# Patient Record
Sex: Female | Born: 1980
Health system: Southern US, Community
[De-identification: ages and names within clinical notes are randomized; demographics above are authoritative.]

## PROBLEM LIST (undated history)

## (undated) DIAGNOSIS — R7303 Prediabetes: Secondary | ICD-10-CM

## (undated) DIAGNOSIS — M1990 Unspecified osteoarthritis, unspecified site: Secondary | ICD-10-CM

## (undated) DIAGNOSIS — T7840XA Allergy, unspecified, initial encounter: Secondary | ICD-10-CM

## (undated) DIAGNOSIS — Z95810 Presence of automatic (implantable) cardiac defibrillator: Secondary | ICD-10-CM

## (undated) DIAGNOSIS — D573 Sickle-cell trait: Secondary | ICD-10-CM

## (undated) DIAGNOSIS — D8685 Sarcoid myocarditis: Secondary | ICD-10-CM

## (undated) HISTORY — DX: Allergy, unspecified, initial encounter: T78.40XA

## (undated) HISTORY — DX: Sarcoid myocarditis: D86.85

## (undated) HISTORY — DX: Sickle-cell trait: D57.3

## (undated) HISTORY — DX: Atrioventricular block, complete: I44.2

## (undated) HISTORY — DX: Unspecified osteoarthritis, unspecified site: M19.90

---

## 1998-04-29 ENCOUNTER — Other Ambulatory Visit: Admission: RE | Admit: 1998-04-29 | Discharge: 1998-04-29 | Payer: Self-pay | Admitting: Family Medicine

## 1998-10-16 ENCOUNTER — Emergency Department (HOSPITAL_COMMUNITY): Admission: EM | Admit: 1998-10-16 | Discharge: 1998-10-16 | Payer: Self-pay | Admitting: Emergency Medicine

## 1998-10-16 ENCOUNTER — Encounter: Payer: Self-pay | Admitting: Emergency Medicine

## 2001-04-29 ENCOUNTER — Other Ambulatory Visit: Admission: RE | Admit: 2001-04-29 | Discharge: 2001-04-29 | Payer: Self-pay | Admitting: Family Medicine

## 2001-10-14 ENCOUNTER — Other Ambulatory Visit: Admission: RE | Admit: 2001-10-14 | Discharge: 2001-10-14 | Payer: Self-pay | Admitting: Internal Medicine

## 2001-12-12 ENCOUNTER — Encounter: Payer: Self-pay | Admitting: *Deleted

## 2001-12-12 ENCOUNTER — Emergency Department (HOSPITAL_COMMUNITY): Admission: EM | Admit: 2001-12-12 | Discharge: 2001-12-12 | Payer: Self-pay | Admitting: *Deleted

## 2003-05-18 ENCOUNTER — Other Ambulatory Visit: Admission: RE | Admit: 2003-05-18 | Discharge: 2003-05-18 | Payer: Self-pay | Admitting: Family Medicine

## 2004-09-26 ENCOUNTER — Ambulatory Visit: Payer: Self-pay | Admitting: Family Medicine

## 2004-10-16 ENCOUNTER — Other Ambulatory Visit: Admission: RE | Admit: 2004-10-16 | Discharge: 2004-10-16 | Payer: Self-pay | Admitting: Family Medicine

## 2004-10-16 ENCOUNTER — Ambulatory Visit: Payer: Self-pay | Admitting: Family Medicine

## 2004-11-21 ENCOUNTER — Ambulatory Visit: Payer: Self-pay | Admitting: Family Medicine

## 2005-01-17 ENCOUNTER — Ambulatory Visit: Payer: Self-pay | Admitting: Family Medicine

## 2005-02-20 ENCOUNTER — Ambulatory Visit: Payer: Self-pay | Admitting: Family Medicine

## 2005-03-07 ENCOUNTER — Ambulatory Visit: Payer: Self-pay | Admitting: Family Medicine

## 2005-04-11 ENCOUNTER — Other Ambulatory Visit: Admission: RE | Admit: 2005-04-11 | Discharge: 2005-04-11 | Payer: Self-pay | Admitting: Family Medicine

## 2005-04-11 ENCOUNTER — Ambulatory Visit: Payer: Self-pay | Admitting: Family Medicine

## 2005-05-18 ENCOUNTER — Ambulatory Visit: Payer: Self-pay | Admitting: Family Medicine

## 2005-06-12 ENCOUNTER — Ambulatory Visit: Payer: Self-pay | Admitting: Internal Medicine

## 2005-06-20 ENCOUNTER — Ambulatory Visit: Payer: Self-pay | Admitting: Family Medicine

## 2005-07-10 ENCOUNTER — Ambulatory Visit: Payer: Self-pay | Admitting: Family Medicine

## 2005-09-18 ENCOUNTER — Ambulatory Visit: Payer: Self-pay | Admitting: Family Medicine

## 2005-09-22 ENCOUNTER — Emergency Department (HOSPITAL_COMMUNITY): Admission: EM | Admit: 2005-09-22 | Discharge: 2005-09-22 | Payer: Self-pay | Admitting: Emergency Medicine

## 2005-11-01 ENCOUNTER — Ambulatory Visit: Payer: Self-pay | Admitting: Family Medicine

## 2005-12-21 ENCOUNTER — Ambulatory Visit: Payer: Self-pay | Admitting: Internal Medicine

## 2005-12-26 ENCOUNTER — Ambulatory Visit: Payer: Self-pay | Admitting: Family Medicine

## 2006-01-21 ENCOUNTER — Ambulatory Visit: Payer: Self-pay | Admitting: Family Medicine

## 2006-02-05 ENCOUNTER — Ambulatory Visit (HOSPITAL_COMMUNITY): Admission: RE | Admit: 2006-02-05 | Discharge: 2006-02-05 | Payer: Self-pay | Admitting: Obstetrics & Gynecology

## 2006-02-21 ENCOUNTER — Emergency Department (HOSPITAL_COMMUNITY): Admission: EM | Admit: 2006-02-21 | Discharge: 2006-02-21 | Payer: Self-pay | Admitting: Emergency Medicine

## 2006-03-01 ENCOUNTER — Ambulatory Visit: Payer: Self-pay | Admitting: Family Medicine

## 2006-04-25 ENCOUNTER — Ambulatory Visit (HOSPITAL_COMMUNITY): Admission: RE | Admit: 2006-04-25 | Discharge: 2006-04-25 | Payer: Self-pay | Admitting: Obstetrics & Gynecology

## 2006-05-27 ENCOUNTER — Inpatient Hospital Stay (HOSPITAL_COMMUNITY): Admission: AD | Admit: 2006-05-27 | Discharge: 2006-06-01 | Payer: Self-pay | Admitting: Obstetrics & Gynecology

## 2006-05-27 ENCOUNTER — Ambulatory Visit: Payer: Self-pay | Admitting: Neonatology

## 2006-05-30 ENCOUNTER — Encounter (INDEPENDENT_AMBULATORY_CARE_PROVIDER_SITE_OTHER): Payer: Self-pay | Admitting: Specialist

## 2006-11-11 ENCOUNTER — Ambulatory Visit: Payer: Self-pay | Admitting: Family Medicine

## 2006-12-09 ENCOUNTER — Ambulatory Visit: Payer: Self-pay | Admitting: Family Medicine

## 2007-01-22 ENCOUNTER — Ambulatory Visit (HOSPITAL_COMMUNITY): Admission: RE | Admit: 2007-01-22 | Discharge: 2007-01-22 | Payer: Self-pay | Admitting: Obstetrics & Gynecology

## 2007-06-25 ENCOUNTER — Encounter: Payer: Self-pay | Admitting: Family Medicine

## 2007-07-08 DIAGNOSIS — J309 Allergic rhinitis, unspecified: Secondary | ICD-10-CM | POA: Insufficient documentation

## 2007-07-08 DIAGNOSIS — K219 Gastro-esophageal reflux disease without esophagitis: Secondary | ICD-10-CM | POA: Insufficient documentation

## 2007-07-08 DIAGNOSIS — N760 Acute vaginitis: Secondary | ICD-10-CM | POA: Insufficient documentation

## 2007-10-17 ENCOUNTER — Telehealth (INDEPENDENT_AMBULATORY_CARE_PROVIDER_SITE_OTHER): Payer: Self-pay | Admitting: Family Medicine

## 2007-10-23 ENCOUNTER — Ambulatory Visit: Payer: Self-pay | Admitting: Nurse Practitioner

## 2008-02-02 ENCOUNTER — Ambulatory Visit: Payer: Self-pay | Admitting: Family Medicine

## 2008-02-02 DIAGNOSIS — R300 Dysuria: Secondary | ICD-10-CM

## 2008-02-02 LAB — CONVERTED CEMR LAB
Bilirubin Urine: NEGATIVE
Blood in Urine, dipstick: NEGATIVE
Ketones, urine, test strip: NEGATIVE
Nitrite: NEGATIVE

## 2008-03-26 ENCOUNTER — Encounter (INDEPENDENT_AMBULATORY_CARE_PROVIDER_SITE_OTHER): Payer: Self-pay | Admitting: Family Medicine

## 2008-10-06 ENCOUNTER — Ambulatory Visit: Payer: Self-pay | Admitting: Family Medicine

## 2008-10-06 ENCOUNTER — Encounter (INDEPENDENT_AMBULATORY_CARE_PROVIDER_SITE_OTHER): Payer: Self-pay | Admitting: Family Medicine

## 2008-10-06 ENCOUNTER — Other Ambulatory Visit: Admission: RE | Admit: 2008-10-06 | Discharge: 2008-10-06 | Payer: Self-pay | Admitting: Family Medicine

## 2008-10-06 LAB — CONVERTED CEMR LAB
Bilirubin Urine: NEGATIVE
GC Probe Amp, Genital: NEGATIVE
Glucose, Urine, Semiquant: NEGATIVE
Ketones, urine, test strip: NEGATIVE
Protein, U semiquant: NEGATIVE
Urobilinogen, UA: 0.2
pH: 6

## 2008-10-31 ENCOUNTER — Encounter (INDEPENDENT_AMBULATORY_CARE_PROVIDER_SITE_OTHER): Payer: Self-pay | Admitting: Family Medicine

## 2008-11-03 ENCOUNTER — Emergency Department (HOSPITAL_COMMUNITY): Admission: EM | Admit: 2008-11-03 | Discharge: 2008-11-03 | Payer: Self-pay | Admitting: Emergency Medicine

## 2008-12-22 ENCOUNTER — Ambulatory Visit: Payer: Self-pay | Admitting: Family Medicine

## 2008-12-22 DIAGNOSIS — J029 Acute pharyngitis, unspecified: Secondary | ICD-10-CM | POA: Insufficient documentation

## 2009-02-14 ENCOUNTER — Telehealth (INDEPENDENT_AMBULATORY_CARE_PROVIDER_SITE_OTHER): Payer: Self-pay | Admitting: Family Medicine

## 2009-04-27 ENCOUNTER — Ambulatory Visit: Payer: Self-pay | Admitting: Nurse Practitioner

## 2009-04-27 LAB — CONVERTED CEMR LAB: GC Probe Amp, Genital: NEGATIVE

## 2009-04-28 ENCOUNTER — Encounter (INDEPENDENT_AMBULATORY_CARE_PROVIDER_SITE_OTHER): Payer: Self-pay | Admitting: Nurse Practitioner

## 2009-04-28 ENCOUNTER — Encounter (INDEPENDENT_AMBULATORY_CARE_PROVIDER_SITE_OTHER): Payer: Self-pay | Admitting: Family Medicine

## 2009-05-06 ENCOUNTER — Telehealth (INDEPENDENT_AMBULATORY_CARE_PROVIDER_SITE_OTHER): Payer: Self-pay | Admitting: Family Medicine

## 2009-05-07 ENCOUNTER — Emergency Department (HOSPITAL_COMMUNITY): Admission: EM | Admit: 2009-05-07 | Discharge: 2009-05-07 | Payer: Self-pay | Admitting: Family Medicine

## 2009-07-22 ENCOUNTER — Telehealth: Payer: Self-pay | Admitting: Physician Assistant

## 2009-07-25 ENCOUNTER — Ambulatory Visit: Payer: Self-pay | Admitting: Physician Assistant

## 2009-07-25 LAB — CONVERTED CEMR LAB
Bilirubin Urine: NEGATIVE
KOH Prep: NEGATIVE
Ketones, urine, test strip: NEGATIVE
Protein, U semiquant: NEGATIVE
pH: 5

## 2009-07-26 ENCOUNTER — Encounter (INDEPENDENT_AMBULATORY_CARE_PROVIDER_SITE_OTHER): Payer: Self-pay | Admitting: Nurse Practitioner

## 2009-08-02 ENCOUNTER — Ambulatory Visit: Payer: Self-pay | Admitting: Nurse Practitioner

## 2009-08-02 DIAGNOSIS — E119 Type 2 diabetes mellitus without complications: Secondary | ICD-10-CM

## 2009-08-02 LAB — CONVERTED CEMR LAB
Bilirubin Urine: NEGATIVE
Blood in Urine, dipstick: NEGATIVE
Microalb, Ur: 0.5 mg/dL (ref 0.00–1.89)
Nitrite: POSITIVE
Specific Gravity, Urine: 1.015

## 2009-08-16 ENCOUNTER — Ambulatory Visit: Payer: Self-pay | Admitting: Nurse Practitioner

## 2009-09-01 ENCOUNTER — Ambulatory Visit: Payer: Self-pay | Admitting: Nurse Practitioner

## 2009-09-01 LAB — CONVERTED CEMR LAB: Blood Glucose, Fingerstick: 100

## 2009-09-02 LAB — CONVERTED CEMR LAB

## 2009-10-10 ENCOUNTER — Encounter (INDEPENDENT_AMBULATORY_CARE_PROVIDER_SITE_OTHER): Payer: Self-pay | Admitting: Nurse Practitioner

## 2009-11-01 ENCOUNTER — Ambulatory Visit: Payer: Self-pay | Admitting: Nurse Practitioner

## 2009-11-01 DIAGNOSIS — L293 Anogenital pruritus, unspecified: Secondary | ICD-10-CM

## 2009-11-01 DIAGNOSIS — L851 Acquired keratosis [keratoderma] palmaris et plantaris: Secondary | ICD-10-CM | POA: Insufficient documentation

## 2009-11-01 DIAGNOSIS — E669 Obesity, unspecified: Secondary | ICD-10-CM

## 2009-11-01 LAB — CONVERTED CEMR LAB
Blood Glucose, Fingerstick: 122
Hgb A1c MFr Bld: 6.2 %

## 2009-12-08 ENCOUNTER — Ambulatory Visit: Payer: Self-pay | Admitting: Nurse Practitioner

## 2009-12-08 DIAGNOSIS — M79609 Pain in unspecified limb: Secondary | ICD-10-CM

## 2009-12-09 ENCOUNTER — Ambulatory Visit (HOSPITAL_COMMUNITY): Admission: RE | Admit: 2009-12-09 | Discharge: 2009-12-09 | Payer: Self-pay | Admitting: Internal Medicine

## 2009-12-13 ENCOUNTER — Telehealth (INDEPENDENT_AMBULATORY_CARE_PROVIDER_SITE_OTHER): Payer: Self-pay | Admitting: Nurse Practitioner

## 2010-01-13 ENCOUNTER — Telehealth (INDEPENDENT_AMBULATORY_CARE_PROVIDER_SITE_OTHER): Payer: Self-pay | Admitting: Nurse Practitioner

## 2010-01-27 ENCOUNTER — Ambulatory Visit: Payer: Self-pay | Admitting: Nurse Practitioner

## 2010-01-27 DIAGNOSIS — B373 Candidiasis of vulva and vagina: Secondary | ICD-10-CM

## 2010-01-27 LAB — CONVERTED CEMR LAB
Bilirubin Urine: NEGATIVE
Glucose, Urine, Semiquant: NEGATIVE
Ketones, urine, test strip: NEGATIVE
Microalb, Ur: 0.5 mg/dL (ref 0.00–1.89)
Urobilinogen, UA: 0.2

## 2010-03-20 ENCOUNTER — Ambulatory Visit: Payer: Self-pay | Admitting: Nurse Practitioner

## 2010-03-20 LAB — CONVERTED CEMR LAB: Blood Glucose, Fingerstick: 122

## 2010-03-28 ENCOUNTER — Ambulatory Visit: Payer: Self-pay | Admitting: Nurse Practitioner

## 2010-04-07 ENCOUNTER — Ambulatory Visit: Payer: Self-pay | Admitting: Nurse Practitioner

## 2010-04-07 LAB — CONVERTED CEMR LAB
Blood Glucose, Fingerstick: 127
Blood in Urine, dipstick: NEGATIVE
Glucose, Urine, Semiquant: NEGATIVE
Protein, U semiquant: NEGATIVE
Urobilinogen, UA: 0.2
pH: 5.5

## 2010-05-09 ENCOUNTER — Ambulatory Visit: Payer: Self-pay | Admitting: Nurse Practitioner

## 2010-05-09 DIAGNOSIS — M549 Dorsalgia, unspecified: Secondary | ICD-10-CM | POA: Insufficient documentation

## 2010-05-09 LAB — CONVERTED CEMR LAB
Bilirubin Urine: NEGATIVE
Ketones, urine, test strip: NEGATIVE
Nitrite: NEGATIVE
Protein, U semiquant: NEGATIVE
Specific Gravity, Urine: 1.01
Urobilinogen, UA: 0.2
pH: 5

## 2010-05-16 ENCOUNTER — Encounter (INDEPENDENT_AMBULATORY_CARE_PROVIDER_SITE_OTHER): Payer: Self-pay | Admitting: Nurse Practitioner

## 2010-06-20 ENCOUNTER — Ambulatory Visit: Payer: Self-pay | Admitting: Nurse Practitioner

## 2010-07-27 ENCOUNTER — Ambulatory Visit: Payer: Self-pay | Admitting: Nurse Practitioner

## 2010-07-27 LAB — CONVERTED CEMR LAB
Blood in Urine, dipstick: NEGATIVE
Specific Gravity, Urine: 1.015
Urobilinogen, UA: 0.2
WBC Urine, dipstick: NEGATIVE
pH: 5.5

## 2010-08-23 ENCOUNTER — Telehealth (INDEPENDENT_AMBULATORY_CARE_PROVIDER_SITE_OTHER): Payer: Self-pay | Admitting: Nurse Practitioner

## 2010-08-24 ENCOUNTER — Ambulatory Visit: Payer: Self-pay | Admitting: Nurse Practitioner

## 2010-08-24 LAB — CONVERTED CEMR LAB
Blood in Urine, dipstick: NEGATIVE
Glucose, Urine, Semiquant: NEGATIVE
Ketones, urine, test strip: NEGATIVE
Nitrite: NEGATIVE
Protein, U semiquant: NEGATIVE

## 2010-09-18 LAB — CONVERTED CEMR LAB

## 2010-09-20 ENCOUNTER — Ambulatory Visit: Payer: Self-pay | Admitting: Nurse Practitioner

## 2010-09-20 ENCOUNTER — Telehealth (INDEPENDENT_AMBULATORY_CARE_PROVIDER_SITE_OTHER): Payer: Self-pay | Admitting: Nurse Practitioner

## 2010-09-20 DIAGNOSIS — J069 Acute upper respiratory infection, unspecified: Secondary | ICD-10-CM

## 2010-09-20 HISTORY — DX: Acute upper respiratory infection, unspecified: J06.9

## 2010-09-20 LAB — CONVERTED CEMR LAB
Bilirubin Urine: NEGATIVE
Glucose, Urine, Semiquant: NEGATIVE
Hgb A1c MFr Bld: 6.1 %
Nitrite: NEGATIVE
Protein, U semiquant: NEGATIVE
pH: 5.5

## 2010-10-06 ENCOUNTER — Telehealth (INDEPENDENT_AMBULATORY_CARE_PROVIDER_SITE_OTHER): Payer: Self-pay | Admitting: Nurse Practitioner

## 2010-10-25 ENCOUNTER — Ambulatory Visit: Payer: Self-pay | Admitting: Nurse Practitioner

## 2010-11-03 ENCOUNTER — Telehealth (INDEPENDENT_AMBULATORY_CARE_PROVIDER_SITE_OTHER): Payer: Self-pay | Admitting: Nurse Practitioner

## 2010-11-21 ENCOUNTER — Encounter (INDEPENDENT_AMBULATORY_CARE_PROVIDER_SITE_OTHER): Payer: Self-pay | Admitting: Nurse Practitioner

## 2010-12-05 NOTE — Progress Notes (Signed)
Summary: Repeat candidiasis symptoms  Phone Note Outgoing Call   Summary of Call: Pt. states she needs a repeat dose of fluconazole because candidiasis went away and came back -- vaginal itching, having a lot of vaginal discharge, thin clear to yellow. Initial call taken by: Dutch Quint RN,  August 23, 2010 9:18 AM  Follow-up for Phone Call        she will need triage visit - wet prep & u/a Follow-up by: Lehman Prom FNP,  August 23, 2010 9:35 AM  Additional Follow-up for Phone Call Additional follow up Details #1::        Appt. scheduled for 08/24/10.  Dutch Quint RN  August 23, 2010 11:13 AM

## 2010-12-05 NOTE — Assessment & Plan Note (Signed)
Summary: Vaginal problems  Nurse Visit   Vital Signs:  Patient profile:   30 year old female Menstrual status:  depo Weight:      175.8 pounds Temp:     98.7 degrees F oral Pulse rate:   68 / minute Pulse rhythm:   regular Resp:     20 per minute  Impression & Recommendations:  Problem # 1:  VAGINAL PRURITUS (ICD-698.1)  C/o recurrent vaginal itching and discharge Urinalysis normal Wet mount nonremarkable Advised re proper perineal hygiene Treating with Cleocin 2% cream - one applicator full intravaginally at bedtime for three days each month  Orders: KOH/ WET Mount [phone] UA Dipstick w/o Micro (automated)  (81003)  Complete Medication List: 1)  Depo-provera 150 Mg/ml Im Susp (Medroxyprogesterone acetate) .... Im q 12 weeks 2)  Lac-hydrin 12 % Lotn (Ammonium lactate) .... One application topically two times a day to affected area 3)  Ibuprofen 800 Mg Tabs (Ibuprofen) .... One tablet by mouth two times a day as needed for pain 4)  Glucophage Xr 500 Mg Xr24h-tab (Metformin hcl) .... One tablet by mouth daily for blood sugar 5)  Prodigy Blood Glucose Monitor Devi (Blood glucose monitoring suppl) .... Use to check blood sugar daily 6)  Prodigy Blood Glucose Test Strp (Glucose blood) .... Use to check blood sugar daily before breakfast 7)  Prodigy Twist Top Lancets 28g Misc (Lancets) .... Use to check blood sugar daily 8)  Fluconazole 150 Mg Tabs (Fluconazole) .... One tablet by mouth x 1 dose 9)  Cleocin 2 % Crea (Clindamycin phosphate) .... One applicator full intravaginally at bedtime for 3 days every month   Physical Exam  General:  alert, well-developed, well-nourished, and well-hydrated.   Genitalia:  self wet prep   Patient Instructions: 1)  Reviewed with Jesse Fall 2)  Your urinalysis was normal. 3)  Since this seems to be recurring monthly, we are treating you with Cleocin 2% - use one applicator full at bedtime for three days each month. 4)  Use proper hygiene  -- wash front to back 5)  Don't use scented soaps, lotions or powders or hygiene products - don't douche 6)  Wear cotton panties or at least cotton-lined 7)  We recommend that you get your flu vaccine this year. 8)  Call if you have any questions or if anything changes.  CC: F/u candidiasis -- wet prep and UA Pain Assessment Patient in pain? no        CC:  F/u candidiasis -- wet prep and UA.  History of Present Illness: States candidiasis went away and came back -- vaginal itching, having a lot of vaginal discharge, thin clear to yellow.   Review of Systems GU:  Complains of discharge; denies abnormal vaginal bleeding and dysuria; having some abdominal cramping, comes and goes.   Allergies: No Known Drug Allergies Laboratory Results   Urine Tests  Date/Time Received: August 24, 2010 11:26 AM   Routine Urinalysis   Color: lt. yellow Glucose: negative   (Normal Range: Negative) Bilirubin: negative   (Normal Range: Negative) Ketone: negative   (Normal Range: Negative) Spec. Gravity: 1.015   (Normal Range: 1.003-1.035) Blood: negative   (Normal Range: Negative) pH: 6.0   (Normal Range: 5.0-8.0) Protein: negative   (Normal Range: Negative) Urobilinogen: 0.2   (Normal Range: 0-1) Nitrite: negative   (Normal Range: Negative) Leukocyte Esterace: trace   (Normal Range: Negative)      Wet Mount Source: vaginal WBC/hpf: 1-5 Bacteria/hpf: rare Clue cells/hpf: few  Yeast/hpf: none Wet Mount KOH: Negative Trichomonas/hpf: none   Orders Added: 1)  Est. Patient Level II [38756] 2)  KOH/ WET Mount [87210] 3)  UA Dipstick w/o Micro (automated)  [81003] Prescriptions: CLEOCIN 2 % CREA (CLINDAMYCIN PHOSPHATE) One applicator full intravaginally at bedtime for 3 days every month  #40 Gm x 1   Entered by:   Dutch Quint RN   Authorized by:   Lehman Prom FNP   Signed by:   Dutch Quint RN on 08/24/2010   Method used:   Print then Give to Patient   RxID:    [id number]

## 2010-12-05 NOTE — Assessment & Plan Note (Signed)
Summary: Right 3rd finger pain   Vital Signs:  Patient profile:   30 year old female Menstrual status:  depo Weight:      194.7 pounds BMI:     36.32 BSA:     1.88 Temp:     98.0 degrees F oral Pulse rate:   101 / minute Pulse rhythm:   regular Resp:     20 per minute BP sitting:   122 / 83  (left arm) Cuff size:   large  Vitals Entered By: Levon Hedger (December 08, 2009 3:40 PM) CC: middle finger pain  x 2 weeks Is Patient Diabetic? No Pain Assessment Patient in pain? yes     Location: finger  Does patient need assistance? Functional Status Self care Ambulation Normal   CC:  middle finger pain  x 2 weeks.  History of Present Illness:  Pt into the office with complaints of right 3rd finger pain started 2 weeks ago unable to recall any trauma trying to avoid increase in pain by avoiding lifting right hand dominant "i had someone to pop it back in place but the pain is still there"  Allergies (verified): No Known Drug Allergies  Review of Systems MS:  Complains of joint pain; pain in right 3rd finger.  Physical Exam  General:  alert.   Head:  normocephalic.   Msk:  active ROM in right hand 3rd finger with obvious inflammation in DIP Neurologic:  alert & oriented X3.     Wrist/Hand Exam  Hand Exam:    Right:    Inspection:  Abnormal    Tenderness:  no    Swelling:  3rd metacarpal    Erythema:  no   Impression & Recommendations:  Problem # 1:  FINGER PAIN (ICD-729.5) will send for an x-ray splint applied anti-inflammatories ordered Orders: Radiology other (Radiology Other)  Complete Medication List: 1)  Depo-provera 150 Mg/ml Im Susp (Medroxyprogesterone acetate) .... Im q 12 weeks 2)  Lac-hydrin 12 % Lotn (Ammonium lactate) .... One application topically two times a day to affected area 3)  Ibuprofen 800 Mg Tabs (Ibuprofen) .... One tablet by mouth two times a day as needed for pain 4)  Fluconazole 150 Mg Tabs (Fluconazole) .... One  tablet by mouth x 1 dose  Patient Instructions: 1)  Take ibuprofen 800mg  by mouth two times a day x 3 days (take with food) 2)  Keep the splint in place for the next week. 3)  Remove at night and do some joint exercises 4)  Get the x-ray of your hand.  You will be informed of the results.  It is likely not fractured but just to be sure Prescriptions: FLUCONAZOLE 150 MG TABS (FLUCONAZOLE) One tablet by mouth x 1 dose  #1 x 0   Entered and Authorized by:   Lehman Prom FNP   Signed by:   Lehman Prom FNP on 12/08/2009   Method used:   Electron[facility] to        [facility] [phone][facility] (retail)       [address], [place]  [address]       Ph: [phone] or [phone]       Fax: [phone]   RxID:   [id number] IBUPROFEN 800 MG TABS (IBUPROFEN) One tablet by mouth two times a day as needed for pain  #30 x 0   Entered and Authorized by:   Lehman Prom FNP   Signed by:  Lehman Prom FNP on 12/08/2009   Method used:   [facility]        [facility] [phone][facility] (retail)       [address]       [place], [place]  [address]       Ph: [phone] or [phone]       Fax: [phone]   RxID:   [id number]

## 2010-12-05 NOTE — Assessment & Plan Note (Signed)
Summary: Diabetes   Vital Signs:  Patient profile:   30 year old female Menstrual status:  depo Weight:      195.9 pounds BMI:     36.55 BSA:     1.89 Temp:     97.4 degrees F oral Pulse rate:   89 / minute Pulse rhythm:   regular Resp:     20 per minute BP sitting:   109 / 64  (left arm) Cuff size:   large  Vitals Entered By: Levon Hedger (January 27, 2010 11:37 AM) CC: vaginal itching and irritation....pt has something on the back of her thumb??? Is Patient Diabetic? No Pain Assessment Patient in pain? yes     Location: vagina CBG Result 96 CBG Device ID B  Does patient need assistance? Functional Status Self care Ambulation Normal   CC:  vaginal itching and irritation....pt has something on the back of her thumb???.  History of Present Illness:  Pt into the office with complaint of a boil on the back of her thigh. Current Boil present for 2 days Previously affected with boils in different parts of her body previously but none in the past year. Tried to manipulate sponateously but was not able to due to pain  Vaginal irriation - started earlier this week. Clear discharge +puritic -abdominal pain Menstrual irregularities since on depoprovera   Allergies (verified): No Known Drug Allergies  Review of Systems General:  Denies fever. CV:  Denies chest pain or discomfort. Resp:  Denies cough. GI:  Denies abdominal pain, nausea, and vomiting. GU:  Complains of discharge. Derm:  Complains of lesion(s); posterior thigh.  Physical Exam  General:  alert.   Head:  normocephalic.   Genitalia:  self wet prep Msk:  up to the exam table Neurologic:  alert & oriented X3.   Skin:  posterior left thigh - slightly tender nodule - well beneath skin in Subcutaneously tissue -  Psych:  Oriented X3.   flat affect   Impression & Recommendations:  Problem # 1:  DIABETES MELLITUS (ICD-250.00)  Hgba1c = 6.5 today will start metformin 500mg  by mouth daily advised  pt to continue to monitor diet Rx for monitor given. advised pt if she is unable to teach herself she can come in for diabetic monitoring teaching by Drucilla Schmidt Orders: Capillary Blood Glucose/CBG (82948) Hgb A1C (91478GN) T-Urine Microalbumin w/creat. ratio [phone])  Her updated medication list for this problem includes:    Glucophage Xr 500 Mg Xr24h-tab (Metformin hcl) ..... One tablet by mouth daily for blood sugar  Problem # 2:  CANDIDIASIS OF VULVA AND VAGINA (ICD-112.1)  recurrent may be due to slightly elevated blood sugars  Complete Medication List: 1)  Depo-provera 150 Mg/ml Im Susp (Medroxyprogesterone acetate) .... Im q 12 weeks 2)  Lac-hydrin 12 % Lotn (Ammonium lactate) .... One application topically two times a day to affected area 3)  Ibuprofen 800 Mg Tabs (Ibuprofen) .... One tablet by mouth two times a day as needed for pain 4)  Glucophage Xr 500 Mg Xr24h-tab (Metformin hcl) .... One tablet by mouth daily for blood sugar 5)  Terconazole 0.4 % Crea (Terconazole) .... One applicatorful at night for 7 nights 6)  Prodigy Blood Glucose Monitor Devi (Blood glucose monitoring suppl) .... Use to check blood sugar daily 7)  Prodigy Blood Glucose Test Strp (Glucose blood) .... Use to check blood sugar daily before breakfast 8)  Prodigy Twist Top Lancets 28g Misc (Lancets) .... Use to check blood sugar daily  Patient Instructions: 1)  Your Hgba1c = 6.5 2)  You will need to start Glucophage 500mg  by mouth daily for your blood sugar. 3)  Get a meter to check your blood sugar every other day. 4)  Increase in blood sugar may be the cause of recurrent yeast infections 5)  Follow u in 6 weeks for diabetes - will need cbg, foot check. Prescriptions: PRODIGY TWIST TOP LANCETS 28G  MISC (LANCETS) Use to check blood sugar daily  #100 x 5   Entered and Authorized by:   Lehman Prom FNP   Signed by:   Lehman Prom FNP on 01/27/2010   Method used:   Print then Give to  Patient   RxID:   [id number] PRODIGY BLOOD GLUCOSE TEST  STRP (GLUCOSE BLOOD) Use to check blood sugar daily before breakfast  #100 x 5   Entered and Authorized by:   Lehman Prom FNP   Signed by:   Lehman Prom FNP on 01/27/2010   Method used:   Print then Give to Patient   RxID:   [id number] PRODIGY BLOOD GLUCOSE MONITOR  DEVI (BLOOD GLUCOSE MONITORING SUPPL) Use to check blood sugar daily  #1 meter x 0   Entered and Authorized by:   Lehman Prom FNP   Signed by:   Lehman Prom FNP on 01/27/2010   Method used:   Print then Give to Patient   RxID:   [id number] TERCONAZOLE 0.4 % CREA (TERCONAZOLE) One applicatorful at night for 7 nights  #45gm x 0   Entered and Authorized by:   Lehman Prom FNP   Signed by:   Lehman Prom FNP on 01/27/2010   Method used:   Print then Give to Patient   RxID:   [id number] GLUCOPHAGE XR 500 MG XR24H-TAB (METFORMIN HCL) One tablet by mouth daily for blood sugar  #30 x 3   Entered and Authorized by:   Lehman Prom FNP   Signed by:   Lehman Prom FNP on 01/27/2010   Method used:   Print then Give to Patient   RxID:   [id number]   Laboratory Results   Urine Tests  Date/Time Received: January 27, 2010 12:26 PM   Routine Urinalysis   Color: lt. yellow Glucose: negative   (Normal Range: Negative) Bilirubin: negative   (Normal Range: Negative) Ketone: negative   (Normal Range: Negative) Spec. Gravity: 1.020   (Normal Range: 1.003-1.035) Blood: negative   (Normal Range: Negative) pH: 6.0   (Normal Range: 5.0-8.0) Protein: negative   (Normal Range: Negative) Urobilinogen: 0.2   (Normal Range: 0-1) Nitrite: negative   (Normal Range: Negative) Leukocyte Esterace: trace   (Normal Range: Negative)     Blood Tests   Date/Time Received: January 27, 2010 12:03 PM   HGBA1C: 6.5%   (Normal Range: Non-Diabetic - 3-6%   Control Diabetic - 6-8%) CBG Random:: 96mg /dL

## 2010-12-05 NOTE — Progress Notes (Signed)
Summary: X-rays result  Phone Note Call from Patient Call back at Home Phone [phone]   Summary of Call: Pt would like to get the results from her x-rays (right hand).  Please call her back. Alben Spittle PA-C Initial call taken by: Manon Hilding,  December 13, 2009 4:33 PM  Follow-up for Phone Call        forward to provider Follow-up by: [place] Shannon,  December 13, 2009 4:44 PM  Additional Follow-up for Phone Call Additional follow up Details #1::        x-ray shows swelling but NO fracture of dislocation. advise pt to wear splint as indicated in office, ibuprofen for swelling Additional Follow-up by: Lehman Prom FNP,  December 13, 2009 10:19 PM    Additional Follow-up for Phone Call Additional follow up Details #2::    pt informed of above information. Follow-up by: Levon Hedger,  December 14, 2009 9:33 AM

## 2010-12-05 NOTE — Letter (Signed)
Summary: Handout Printed  Printed Handout:  - Back Pain

## 2010-12-05 NOTE — Progress Notes (Signed)
Summary: ESTRATAB IS NOT O.T.C  Phone Note Call from Patient Call back at [facility] Phone [phone]   Summary of Call: MARTIN PT. MS Herrmann CALLED BACK TO LET YOU KNOW  THAT THE ESTRATAB CAN'T BE BOUGHT OVER THE COUNTER, IT NEEDS TO BE [facility]ED INTO [facility] [address] Initial call taken by: Leodis Rains,  September 20, 2010 2:19 PM  Follow-up for Phone Call        It is over the counter - explain to pt that it is a combination of black cohosh and evening primrose.  She can buy these herbal supplement separately if they don't have them in the over the counter combined version. Pt does NOT need ESTROGEN - hormone replacement which is prescription Follow-up by: Lehman Prom FNP,  September 20, 2010 3:09 PM  Additional Follow-up for Phone Call Additional follow up Details #1::        Left message on answering machine for pt. to return call.  Dutch Quint RN  September 20, 2010 3:38 PM  Advised of provider's response -- instructed to have pharmacy staff help her locate product if she can't locate it herself.  Verbalized understanding.  Dutch Quint RN  September 20, 2010 3:47 PM

## 2010-12-05 NOTE — Assessment & Plan Note (Signed)
Summary: Acute - Back pain   Vital Signs:  Patient profile:   30 year old female Menstrual status:  depo Weight:      192.1 pounds BMI:     35.84 Temp:     97.7 degrees F oral Pulse rate:   71 / minute Pulse rhythm:   regular Resp:     16 per minute BP sitting:   105 / 70  (left arm) Cuff size:   large  Vitals Entered By: Levon Hedger (May 09, 2010 12:31 PM) CC: pain in abdomen with back pain and side pain x 2 weeks has been taking Ibuprofen for pain with no relief Is Patient Diabetic? No Pain Assessment Patient in pain? yes     Location: back CBG Result 97 CBG Device ID B  Does patient need assistance? Functional Status Self care Ambulation Normal   CC:  pain in abdomen with back pain and side pain x 2 weeks has been taking Ibuprofen for pain with no relief.  History of Present Illness:  Pt into the office for with complaints of back pain for the past 2 weeks. +lower back pain (bilateral) nothing aggravates the pain ibuprofen improves the pain but when the medication wears off the pain returns. Pt admits that she has been trying to increase her exercise and even about 3 weeks ago she started jogging. Prior to that she was walking but then decided to change her routine to jogging.  Jogged only once for about 15 minutes.  Presents today with her daughter   Hot flashes/night sweats - noticed over the past 2 months the symptoms have been increasing.  Takes depoprovera and has been for the past 3 years. She has 2 children and is not interested at this time in having more children  Allergies (verified): No Known Drug Allergies  Review of Systems General:  Complains of sweats; night sweats. CV:  Denies chest pain or discomfort. Resp:  Denies cough. GI:  Denies abdominal pain, nausea, and vomiting. MS:  Complains of low back pain.  Physical Exam  General:  alert Head:  normocephalic.   Eyes:  glasses Lungs:  normal breath sounds.   Heart:  normal rate and  regular rhythm.   Abdomen:  soft, non-tender, and normal bowel sounds.   Neurologic:  alert & oriented X3 and gait normal.     Detailed Back/Spine Exam  Lumbosacral Exam:  Inspection-deformity:    Normal Palpation-spinal tenderness:  Abnormal    Location:  L4-L5 Sitting Straight Leg Raise:    Right:  negative    Left:  negative   Impression & Recommendations:  Problem # 1:  BACK PAIN (ICD-724.5)  reviewed with pt that she has likely pulled muscles due to overuse with activity Her updated medication list for this problem includes:    Ibuprofen 800 Mg Tabs (Ibuprofen) ..... One tablet by mouth two times a day as needed for pain  Orders: UA Dipstick w/o Micro (manual) (16109)  Complete Medication List: 1)  Depo-provera 150 Mg/ml Im Susp (Medroxyprogesterone acetate) .... Im q 12 weeks 2)  Lac-hydrin 12 % Lotn (Ammonium lactate) .... One application topically two times a day to affected area 3)  Ibuprofen 800 Mg Tabs (Ibuprofen) .... One tablet by mouth two times a day as needed for pain 4)  Glucophage Xr 500 Mg Xr24h-tab (Metformin hcl) .... One tablet by mouth daily for blood sugar 5)  Prodigy Blood Glucose Monitor Devi (Blood glucose monitoring suppl) .... Use to check blood  sugar daily 6)  Prodigy Blood Glucose Test Strp (Glucose blood) .... Use to check blood sugar daily before breakfast 7)  Prodigy Twist Top Lancets 28g Misc (Lancets) .... Use to check blood sugar daily  Other Orders: Capillary Blood Glucose/CBG (16109)  Patient Instructions: 1)  You need to take ibuprofen 800mg  by mouth three times a day for at least the next 3 days for back pain. 2)  Your urine is ok today so you do not have infection 3)  Back pain is most likely due to a change in your activity - running. slowly increase activity such as doing 1 minute of running this walk for the rest of your 15 minute workout 4)  Night sweats/hot flashes - over the counter black cohosh and evening primrose 5)   Follow up as needed  Laboratory Results   Urine Tests  Date/Time Received: May 09, 2010 12:33 PM   Routine Urinalysis   Color: lt. yellow Glucose: negative   (Normal Range: Negative) Bilirubin: negative   (Normal Range: Negative) Ketone: negative   (Normal Range: Negative) Spec. Gravity: 1.010   (Normal Range: 1.003-1.035) Blood: negative   (Normal Range: Negative) pH: 5.0   (Normal Range: 5.0-8.0) Protein: negative   (Normal Range: Negative) Urobilinogen: 0.2   (Normal Range: 0-1) Nitrite: negative   (Normal Range: Negative) Leukocyte Esterace: trace   (Normal Range: Negative)     Blood Tests     CBG Random:: 97

## 2010-12-05 NOTE — Progress Notes (Signed)
Summary: thinks she has bronchitis  Phone Note Call from Patient Call back at Home Phone [phone]   Reason for Call: Talk to Nurse Summary of Call: MARTIN PT. MS Noga CALLED BECAUSE SHE IS DOING A LOT OF COUGHING AND THINKS ITS BRONCHITIS AND  SHE WANTS AN APPT. SOONER THAN THE 21st. Initial call taken by: Leodis Rains,  October 06, 2010 8:53 AM  Follow-up for Phone Call        Coughing badly when gets around smokers, feels like she has to vomit, but isn't nauseated.  Gets congested, has a little trouble breathing, but clears up about 15-20 minutes after getting away from the smoke.  Starts to cough when she walks into a room where people have been smoking.  Feels much better than when she was here on 11/16.  States this just started recently.  I suggested  that she may be having a reaction to the irritant, or have developed a sensitivity since her last URI.  Advised to limit her exposure as much as possible to smokers and rooms where people have smoked -- verbalized understanding.  Is there something she can take? Follow-up by: Dutch Quint RN,  October 06, 2010 5:11 PM  Additional Follow-up for Phone Call Additional follow up Details #1::        Avoidance of what sounds like an irritant is the best plan. Additional Follow-up by: Julieanne Manson MD,  October 06, 2010 6:19 PM    Additional Follow-up for Phone Call Additional follow up Details #2::    Pt. states she is feeling better, trying to stay away from smoking areas. Follow-up by: Gaylyn Cheers RN,  October 09, 2010 9:58 AM

## 2010-12-05 NOTE — Assessment & Plan Note (Signed)
Summary: Acute - Candidiasis   Vital Signs:  Patient profile:   30 year old female Menstrual status:  depo Weight:      182.3 pounds BMI:     34.01 Temp:     97.7 degrees F oral Pulse rate:   72 / minute Pulse rhythm:   regular Resp:     16 per minute BP sitting:   110 / 60  (left arm) Cuff size:   large  Vitals Entered By: Levon Hedger (July 27, 2010 10:50 AM)  Nutrition Counseling: Patient's BMI is greater than 25 and therefore counseled on weight management options. CC: pain and soreness in vaginal area with alot of itching she says that she saw more discharge last month but none this month. pt is currently spotting Pain Assessment Patient in pain? no      CBG Result 92 CBG Device ID B  Does patient need assistance? Functional Status Self care Ambulation Normal   CC:  pain and soreness in vaginal area with alot of itching she says that she saw more discharge last month but none this month. pt is currently spotting.  History of Present Illness:  Pt into the office with c/o vaginal irritation. Symptoms started 5 days ago. Vaginal itching initially which progresed to irritation. Noticed some thick white discharge prior to onset of menses No pain voiding with urination only irritation when wiping Menses started 1 day ago. (unpredictable in nature) No abdominal pain Hx of recurrent candidiasis especially prior to dx of diabetes. She reports that she has started to eat yogurt and is practicing good hygiene.  Family planning - Pt is currently on depoprovera and was given her last injection on time last month.   Diabetes Management History:      The patient is a 30 years old female who comes in for evaluation of DM Type 2.  She is (or has been) enrolled in the "Diabetic Education Program".  She is checking home blood sugars.  She says that she is exercising.  Type of exercise includes: walking.  Duration of exercise is estimated to be <30  She is doing this 3  times per week.        Other comments include: Pt is checking blood sugars at home and reports that it is still doing well.    Allergies (verified): No Known Drug Allergies  Review of Systems CV:  Denies chest pain or discomfort. Resp:  Denies cough. GI:  Denies abdominal pain, nausea, and vomiting. GU:  vaginal irritation.  Physical Exam  General:  alert.   Head:  normocephalic.   Lungs:  normal breath sounds.   Heart:  normal rate and regular rhythm.   Msk:  normal ROM.   Neurologic:  alert & oriented X3.     Impression & Recommendations:  Problem # 1:  CANDIDIASIS OF VULVA AND VAGINA (ICD-112.1)  Dx based symptoms  also with pt's cycle irregularities this month continue with good hygiene and yogurt will need wet prep and vaginal exam is symptoms continue  Her updated medication list for this problem includes:    Fluconazole 150 Mg Tabs (Fluconazole) ..... One tablet by mouth x 1 dose  Complete Medication List: 1)  Depo-provera 150 Mg/ml Im Susp (Medroxyprogesterone acetate) .... Im q 12 weeks 2)  Lac-hydrin 12 % Lotn (Ammonium lactate) .... One application topically two times a day to affected area 3)  Ibuprofen 800 Mg Tabs (Ibuprofen) .... One tablet by mouth two times a day as needed  for pain 4)  Glucophage Xr 500 Mg Xr24h-tab (Metformin hcl) .... One tablet by mouth daily for blood sugar 5)  Prodigy Blood Glucose Monitor Devi (Blood glucose monitoring suppl) .... Use to check blood sugar daily 6)  Prodigy Blood Glucose Test Strp (Glucose blood) .... Use to check blood sugar daily before breakfast 7)  Prodigy Twist Top Lancets 28g Misc (Lancets) .... Use to check blood sugar daily 8)  Fluconazole 150 Mg Tabs (Fluconazole) .... One tablet by mouth x 1 dose  Other Orders: UA Dipstick w/o Micro (manual) (01027)  Diabetes Management Assessment/Plan:      Her blood pressure goal is < 130/80.    Patient Instructions: 1)  Irregularities in your menstrual cycle may  improve over the coming weeks.  This may be the cause of current vaginal irritation - hormones. 2)  Since you are spotting will give oral medication instead of cream. 3)  Keep your next scheduled appointment as scheduled Prescriptions: FLUCONAZOLE 150 MG TABS (FLUCONAZOLE) One tablet by mouth x 1 dose  #1 x 0   Entered and Authorized by:   Lehman Prom FNP   Signed by:   Lehman Prom FNP on 07/27/2010   Method used:   Print then Give to Patient   RxID:   [id number]   Prevention & Chronic Care Immunizations   Influenza vaccine: Fluvax 3+  (11/01/2009)    Tetanus booster: 06/20/2010: Tdap    Pneumococcal vaccine: Not documented  Other Screening   Pap smear: historical per pt  (09/02/2009)   Smoking status: never  (06/20/2010)  Diabetes Mellitus   HgbA1C: 6.2  (06/20/2010)    Eye exam: Not documented    Foot exam: yes  (03/20/2010)   High risk foot: No  (03/20/2010)   Foot care education: Done  (03/20/2010)    Urine microalbumin/creatinine ratio: Not documented  Self-Management Support :    Diabetes self-management support: Not documented   Laboratory Results   Urine Tests  Date/Time Received: July 27, 2010 11:35 AM   Routine Urinalysis   Color: lt. yellow Glucose: negative   (Normal Range: Negative) Bilirubin: negative   (Normal Range: Negative) Ketone: negative   (Normal Range: Negative) Spec. Gravity: 1.015   (Normal Range: 1.003-1.035) Blood: negative   (Normal Range: Negative) pH: 5.5   (Normal Range: 5.0-8.0) Protein: negative   (Normal Range: Negative) Urobilinogen: 0.2   (Normal Range: 0-1) Nitrite: negative   (Normal Range: Negative) Leukocyte Esterace: negative   (Normal Range: Negative)     Blood Tests     CBG Random:: 92mg /dL   Comments: No wet prep done since pt is spotting

## 2010-12-05 NOTE — Letter (Signed)
Summary: NUTRITION SUMMARY//SUSIE  NUTRITION SUMMARY//SUSIE   Imported By: Arta Bruce 04/10/2010 09:08:13  _____________________________________________________________________  External Attachment:    Type:   Image     Comment:   External Document

## 2010-12-05 NOTE — Medication Information (Signed)
Summary: RX Folder//PRODIGY TEST STRIPS/CLARIFY  RX Folder//PRODIGY TEST STRIPS/CLARIFY   Imported By: Arta Bruce 05/16/2010 10:14:39  _____________________________________________________________________  External Attachment:    Type:   Image     Comment:   External Document

## 2010-12-05 NOTE — Assessment & Plan Note (Signed)
Summary: Diabetes/URI   Vital Signs:  Patient profile:   30 year old female Menstrual status:  depo Weight:      178.7 pounds BMI:     33.34 Temp:     97.6 degrees F oral Pulse rate:   70 / minute Pulse rhythm:   regular Resp:     16 per minute BP sitting:   110 / 60  (left arm) Cuff size:   large  Vitals Entered By: Levon Hedger (September 20, 2010 11:49 AM)  Nutrition Counseling: Patient's BMI is greater than 25 and therefore counseled on weight management options. CC: follow-up visit DM.Marland Kitchencough x 2 weeks that she can't get rid of Is Patient Diabetic? Yes Pain Assessment Patient in pain? no      CBG Result 103 CBG Device ID B  Does patient need assistance? Functional Status Self care Ambulation Normal   CC:  follow-up visit DM.Marland Kitchencough x 2 weeks that she can't get rid of.  History of Present Illness:  Pt into the office for diabetes f/u but also has some Upper respiratory symptoms +cough (nonproductive) -sore throat -fever -earache -nasal congestion Denies any sick contacts cough is worse at night  Family planning - pt went to GYN for repeat pap. Pt states that she was told that depoprovera may be causing some symptoms related to vaginal irriation and dryness.  She is considering stopping depoprovera and converting to oral contraception  Allergies: No Known Drug Allergies  Review of Systems General:  Denies fever. ENT:  Complains of sore throat; denies earache, hoarseness, and nasal congestion. CV:  Denies chest pain or discomfort. Resp:  Complains of cough; denies wheezing. GI:  Denies abdominal pain, nausea, and vomiting.  Physical Exam  General:  alert.   Head:  normocephalic.   Eyes:  glasses Ears:  Right TM with clear fluid - slight eythema left TM with clear fluid - no erythema, bony landmarks present Lungs:  normal breath sounds.   Heart:  normal rate and regular rhythm.   Abdomen:  normal bowel sounds.    Diabetes Management Exam:  Foot Exam (with socks and/or shoes not present):       Sensory-Monofilament:          Left foot: normal          Right foot: normal   Impression & Recommendations:  Problem # 1:  URI (ICD-465.9) Advised pt for symptomatic control will order cough syrup Her updated medication list for this problem includes:    Ibuprofen 800 Mg Tabs (Ibuprofen) ..... One tablet by mouth two times a day as needed for pain    Dex Pc 04-06-14 Mg/45ml Syrp (Phenylephrine-chlorphen-dm) .Marland Kitchen... 1-2 teaspoons every 6 hours as needed for cough  Problem # 2:  DIABETES MELLITUS (ICD-250.00) good control Her updated medication list for this problem includes:    Glucophage Xr 500 Mg Xr24h-tab (Metformin hcl) ..... One tablet by mouth daily for blood sugar  Orders: Capillary Blood Glucose/CBG (16109) UA Dipstick w/o Micro (manual) (60454)  Problem # 3:  FAMILY PLANNING (ICD-V25.09) will defer to GYN  - she just got depoprovera this week at GYN and also PAP done  Problem # 4:  NEED PROPHYLACTIC VACCINATION&INOCULATION FLU (ICD-V04.81) given today in office  Complete Medication List: 1)  Depo-provera 150 Mg/ml Im Susp (Medroxyprogesterone acetate) .... Im q 12 weeks 2)  Ibuprofen 800 Mg Tabs (Ibuprofen) .... One tablet by mouth two times a day as needed for pain 3)  Glucophage Xr 500 Mg Xr24h-tab (  Metformin hcl) .... One tablet by mouth daily for blood sugar 4)  Prodigy Blood Glucose Monitor Devi (Blood glucose monitoring suppl) .... Use to check blood sugar daily 5)  Prodigy Blood Glucose Test Strp (Glucose blood) .... Use to check blood sugar daily before breakfast 6)  Prodigy Twist Top Lancets 28g Misc (Lancets) .... Use to check blood sugar daily 7)  Dex Pc 04-06-14 Mg/67ml Syrp (Phenylephrine-chlorphen-dm) .Marland Kitchen.. 1-2 teaspoons every 6 hours as needed for cough  Other Orders: Flu Vaccine 107yrs + (27253) Admin 1st Vaccine (66440)  Patient Instructions: 1)  You have received the flu vaccine today. 2)  May take  estratab over the counter to help with some symptoms related to menopause. 3)  Upper respiratory symptoms - You can take cough medications as needed. 4)  You need to drink plenty of water and eat fruit and veges. 5)  follow up in 3 months for diabetes or sooner if necessary Prescriptions: DEX PC 04-06-14 MG/5ML SYRP (PHENYLEPHRINE-CHLORPHEN-DM) 1-2 teaspoons every 6 hours as needed for cough  #4 ounces x 0   Entered and Authorized by:   Lehman Prom FNP   Signed by:   Lehman Prom FNP on 09/20/2010   Method used:   Printed then faxed to ...       [facility]  [address] [phone] (retail)       [phone], [place]  [address]       Ph: [phone] or [phone]       Fax: [phone]   RxID:   [id number]    Orders Added: 1)  Capillary Blood Glucose/CBG [82948] 2)  Flu Vaccine 63yrs + [83151] 3)  Admin 1st Vaccine [90471] 4)  Est. Patient Level III [76160] 5)  UA Dipstick w/o Micro (manual) [81002]   Immunizations Administered:  Influenza Vaccine # 1:    Vaccine Type: Fluvax 3+    Site: right deltoid    Mfr: GlaxoSmithKline    Dose: 0.5 ml    Route: IM    Given by: Levon Hedger    Exp. Date: 05/05/2011    Lot #: [id number]    VIS given: 05/30/10 version given September 20, 2010.  Flu Vaccine Consent Questions:    Do you have a history of severe allergic reactions to this vaccine? no    Any prior history of allergic reactions to egg and/or gelatin? no    Do you have a sensitivity to the preservative Thimersol? no    Do you have a past history of Guillan-Barre Syndrome? no    Do you currently have an acute febrile illness? no    Have you ever had a severe reaction to latex? no    Vaccine information given and explained to patient? yes    Are you currently pregnant? no    ndc  [phone]  Immunizations Administered:  Influenza Vaccine # 1:    Vaccine Type: Fluvax 3+    Site: right deltoid    Mfr: GlaxoSmithKline    Dose: 0.5 ml    Route: IM     Given by: Levon Hedger    Exp. Date: 05/05/2011    Lot #: [id number]    VIS given: 05/30/10 version given September 20, 2010.  Laboratory Results   Urine Tests  Date/Time Received: September 20, 2010 12:05 PM   Routine Urinalysis   Color: lt. yellow Appearance: Clear Glucose: negative   (Normal Range: Negative) Bilirubin: negative   (Normal Range: Negative) Ketone:  negative   (Normal Range: Negative) Spec. Gravity: 1.015   (Normal Range: 1.003-1.035) Blood: negative   (Normal Range: Negative) pH: 5.5   (Normal Range: 5.0-8.0) Protein: negative   (Normal Range: Negative) Urobilinogen: 0.2   (Normal Range: 0-1) Nitrite: negative   (Normal Range: Negative) Leukocyte Esterace: small   (Normal Range: Negative)     Blood Tests   Date/Time Received: September 20, 2010 12:17 PM   HGBA1C: 6.1%   (Normal Range: Non-Diabetic - 3-6%   Control Diabetic - 6-8%) CBG Random:: 103                  Diabetic Foot Exam    10-g (5.07) Semmes-Weinstein Monofilament Test Performed by: Levon Hedger          Right Foot          Left Foot Visual Inspection               Test Control      normal         normal Site 1         normal         normal Site 2         normal         normal Site 3         normal         normal Site 4         normal         normal Site 5         normal         normal Site 6         normal         normal Site 7         normal         normal Site 8         normal         normal Site 9         normal         normal Site 10         normal         normal  Impression      normal         normal

## 2010-12-05 NOTE — Progress Notes (Signed)
Summary: Diarrhea  Phone Note Call from Patient   Summary of Call: pt states she has had diarrhea since Wednesday of this weeks she says that she is unable to keep water down it is coming right straight back out of her.  She says she has had no fever and has been having a headache. Pt want to know what she can take to get this to calm down. Initial call taken by: Levon Hedger,  January 13, 2010 2:54 PM  Follow-up for Phone Call        Pt can try to take pepto bismol. Eat a Bland diet - gatorade, chicken noodle soup, crackers,  bread, bland food so she does not get dehydrated Follow-up by: Lehman Prom FNP,  January 13, 2010 3:37 PM  Additional Follow-up for Phone Call Additional follow up Details #1::        pt informed of above information. Additional Follow-up by: Levon Hedger,  January 13, 2010 3:57 PM

## 2010-12-05 NOTE — Assessment & Plan Note (Signed)
Summary: back pain/lower stomach pain///kt   Vital Signs:  Patient profile:   30 year old female Menstrual status:  depo Pulse rhythm:   regular Cuff size:   large  Vitals Entered By: Levon Hedger (May 09, 2010 11:32 AM)  Allergies: No Known Drug Allergies   Complete Medication List: 1)  Depo-provera 150 Mg/ml Im Susp (Medroxyprogesterone acetate) .... Im q 12 weeks 2)  Lac-hydrin 12 % Lotn (Ammonium lactate) .... One application topically two times a day to affected area 3)  Ibuprofen 800 Mg Tabs (Ibuprofen) .... One tablet by mouth two times a day as needed for pain 4)  Glucophage Xr 500 Mg Xr24h-tab (Metformin hcl) .... One tablet by mouth daily for blood sugar 5)  Prodigy Blood Glucose Monitor Devi (Blood glucose monitoring suppl) .... Use to check blood sugar daily 6)  Prodigy Blood Glucose Test Strp (Glucose blood) .... Use to check blood sugar daily before breakfast 7)  Prodigy Twist Top Lancets 28g Misc (Lancets) .... Use to check blood sugar daily 8)  Fluconazole 150 Mg Tabs (Fluconazole) .... One tablet by mouth daily x 3 days  Appended Document: back pain/lower stomach pain///kt partial note - see additional entry with same date for visit details

## 2010-12-05 NOTE — Assessment & Plan Note (Signed)
Summary: Diabetes   Vital Signs:  Patient profile:   31 year old female Menstrual status:  depo Height:      61.50 inches Weight:      195.5 pounds BMI:     36.47 Temp:     97.8 degrees F oral Pulse rate:   80 / minute Pulse rhythm:   regular Resp:     18 per minute BP sitting:   116 / 77  (left arm) Cuff size:   large  Vitals Entered By: Levon Hedger (Mar 20, 2010 12:44 PM)  History of Present Illness:  Pt into the office for f/u diabetes Pt was started on metformin on last visit. She is taking once a day (medications present today with pt) Glucometer - She has a glucometer but has not been able to understand how to use the machine.  Diabetes Management History:      The patient is a 30 years old female who comes in for evaluation of DM Type 2.  She is (or has been) enrolled in the "Diabetic Education Program".  She states understanding of dietary principles but she is not following the appropriate diet.  No sensory loss is reported.  Self foot exams are not being performed.  She is not checking home blood sugars.  She says that she is exercising.  Type of exercise includes: walking.  Duration of exercise is estimated to be <30  She is doing this 3 times per week.        Hypoglycemic symptoms are not occurring.  No hyperglycemic symptoms are reported.        The following changes have been made to her treatment plan since last visit: medication changes.  Treatment plan changes were initiated by MD.  Dietary compliance is reported to be fair.    Allergies: No Known Drug Allergies  Diabetic Foot Exam Foot Inspection Is there a history of a foot ulcer?              Yes Is there a foot ulcer now?              No Can the patient see the bottom of their feet?          Yes Are the shoes appropriate in style and fit?          No Is there swelling or an abnormal foot shape?          No Are the toenails long?                No Are the toenails thick?                No Are the  toenails ingrown?              No Is there heavy callous build-up?              No Is there pain in the calf muscle (Intermittent claudication) when walking?    No Diabetic Foot Care Education Patient educated on appropriate care of diabetic feet.  Pulse Check          Right Foot          Left Foot Dorsalis Pedis:        normal            normal  High Risk Feet? No   10-g (5.07) Semmes-Weinstein Monofilament Test           Right Foot  Left Foot Visual Inspection               Test Control      normal         normal Site 1         normal         normal Site 2         normal         normal Site 3         normal         normal Site 4         normal         normal Site 5         normal         normal Site 6         normal         normal Site 7         normal         normal Site 8         normal         normal Site 9         normal         normal Site 10         normal         normal  Impression      normal         normal  Review of Systems General:  Complains of fatigue. CV:  Denies chest pain or discomfort. Resp:  Denies cough. Endo:  Denies excessive thirst and excessive urination.  Physical Exam  General:  alert.   Head:  normocephalic.   Eyes:  glasses Lungs:  normal breath sounds.   Heart:  normal rate and regular rhythm.    Diabetes Management Exam:    Foot Exam (with socks and/or shoes not present):       Sensory-Monofilament:          Left foot: normal          Right foot: normal       Nails:          Left foot: normal          Right foot: normal   Impression & Recommendations:  Problem # 1:  DIABETES MELLITUS (ICD-250.00)  Pt advised to make an appt with susie piper to get teaching on glucometer Pt is taking meds as ordered and is tolerating Her updated medication list for this problem includes:    Glucophage Xr 500 Mg Xr24h-tab (Metformin hcl) ..... One tablet by mouth daily for blood sugar  Orders: Capillary Blood Glucose/CBG  (16109)  Complete Medication List: 1)  Depo-provera 150 Mg/ml Im Susp (Medroxyprogesterone acetate) .... Im q 12 weeks 2)  Lac-hydrin 12 % Lotn (Ammonium lactate) .... One application topically two times a day to affected area 3)  Ibuprofen 800 Mg Tabs (Ibuprofen) .... One tablet by mouth two times a day as needed for pain 4)  Glucophage Xr 500 Mg Xr24h-tab (Metformin hcl) .... One tablet by mouth daily for blood sugar 5)  Prodigy Blood Glucose Monitor Devi (Blood glucose monitoring suppl) .... Use to check blood sugar daily 6)  Prodigy Blood Glucose Test Strp (Glucose blood) .... Use to check blood sugar daily before breakfast 7)  Prodigy Twist Top Lancets 28g Misc (Lancets) .... Use to check blood sugar daily  Diabetes Management Assessment/Plan:      Her blood pressure  goal is < 130/80.    Patient Instructions: 1)  Schedule an appointment with Susie Piper - diabetes educator. 2)  Bring your glucometer with you into the office 3)  Check your blood sugar at least three times a day BEFORE breakfast.  Blood sugar should be between 80-120. 4)  Follow up in 3 months with n.martin, fnp for diabetes  Laboratory Results   Blood Tests   Date/Time Received: Mar 20, 2010 1:08 PM   CBG Random:: 122mg /dL

## 2010-12-05 NOTE — Assessment & Plan Note (Signed)
Summary: Diabetes   Vital Signs:  Patient profile:   30 year old female Menstrual status:  depo Height:      61.50 inches Weight:      190.3 pounds BMI:     35.50 Temp:     97.4 degrees F oral Pulse rate:   66 / minute Pulse rhythm:   regular Resp:     16 per minute BP sitting:   107 / 69  (left arm)  Vitals Entered ByBartolo Darter (June 20, 2010 11:43 AM)  Nutrition Counseling: Patient's BMI is greater than 25 and therefore counseled on weight management options. CC: follow-up . for diabetes, Hypertension Management, Abdominal Pain Is Patient Diabetic? Yes Pain Assessment Patient in pain? no      CBG Result 133 CBG Device ID B  Does patient need assistance? Functional Status Self care Ambulation Normal   CC:  follow-up . for diabetes, Hypertension Management, and Abdominal Pain.  History of Present Illness:  Pt into the office for f/u on diabetes.  Tetanus - last over 10 years ago. Pt has gotten already today  No acute problems today - presents with her 32 year old daughter  Diabetes Management History:      The patient is a 30 years old female who comes in for evaluation of DM Type 2.  She is (or has been) enrolled in the "Diabetic Education Program".  She states lack of understanding of dietary principles and is not following her diet appropriately.  No sensory loss is reported.  Self foot exams are not being performed.  She is checking home blood sugars.  She says that she is exercising.  Type of exercise includes: walking.  Duration of exercise is estimated to be <30  She is doing this 3 times per week.        Hypoglycemic symptoms are not occurring.  No hyperglycemic symptoms are reported.  Other comments include: pt is checking blood sugar daily and is taking meds as ordered.    Dyspepsia History:      She has no alarm features of dyspepsia including no history of melena, hematochezia, dysphagia, persistent vomiting, or involuntary weight loss > 5%.   There is a prior history of GERD.  The patient does not have a prior history of documented ulcer disease.  The dominant symptom is not heartburn or acid reflux.  An H-2 blocker medication is not currently being taken.  She has no history of a positive H. Pylori serology.  No previous upper endoscopy has been done.    Hypertension History:      Positive major cardiovascular risk factors include diabetes.  Negative major cardiovascular risk factors include female age less than 93 years old and non-tobacco-user status.      Habits & Providers  Alcohol-Tobacco-Diet     Alcohol drinks/day: 0     Tobacco Status: never     Diet Comments: low calorie and low cholesterol diet  Exercise-Depression-Behavior     Does Patient Exercise: yes     Exercise Counseling: to improve exercise regimen     Type of exercise: walking     Exercise (avg: min/session): <30     Times/week: 3     Depression Counseling: not indicated; screening negative for depression     Seat Belt Use: 100     Sun Exposure: occasionally  Current Medications (verified): 1)  Depo-Provera 150 Mg/ml Im Susp (Medroxyprogesterone Acetate) .... Im Q 12 Weeks 2)  Lac-Hydrin 12 % Lotn (Ammonium Lactate) .Marland KitchenMarland KitchenMarland Kitchen  One Application Topically Two Times A Day To Affected Area 3)  Ibuprofen 800 Mg Tabs (Ibuprofen) .... One Tablet By Mouth Two Times A Day As Needed For Pain 4)  Glucophage Xr 500 Mg Xr24h-Tab (Metformin Hcl) .... One Tablet By Mouth Daily For Blood Sugar 5)  Prodigy Blood Glucose Monitor  Devi (Blood Glucose Monitoring Suppl) .... Use To Check Blood Sugar Daily 6)  Prodigy Blood Glucose Test  Strp (Glucose Blood) .... Use To Check Blood Sugar Daily Before Breakfast 7)  Prodigy Twist Top Lancets 28g  Misc (Lancets) .... Use To Check Blood Sugar Daily  Allergies (verified): No Known Drug Allergies  Review of Systems CV:  Denies chest pain or discomfort. Resp:  Denies cough. GI:  Denies abdominal pain, nausea, and  vomiting.  Physical Exam  General:  alert.   Head:  normocephalic.   Eyes:  glasses Lungs:  normal breath sounds.   Heart:  normal rate and regular rhythm.   Abdomen:  normal bowel sounds.   Msk:  up to the exam table Neurologic:  alert & oriented X3.   Skin:  color normal.   Psych:  Oriented X3.     Impression & Recommendations:  Problem # 1:  DIABETES MELLITUS (ICD-250.00)  doing well  continue current meds advised pt to check blood sugar daily before breakfast Her updated medication list for this problem includes:    Glucophage Xr 500 Mg Xr24h-tab (Metformin hcl) ..... One tablet by mouth daily for blood sugar  Orders: Hemoglobin A1C (83036) Capillary Blood Glucose/CBG (78295)  Problem # 2:  OBESITY (ICD-278.00) down 2 pounds since last visit. tdap given today  Complete Medication List: 1)  Depo-provera 150 Mg/ml Im Susp (Medroxyprogesterone acetate) .... Im q 12 weeks 2)  Lac-hydrin 12 % Lotn (Ammonium lactate) .... One application topically two times a day to affected area 3)  Ibuprofen 800 Mg Tabs (Ibuprofen) .... One tablet by mouth two times a day as needed for pain 4)  Glucophage Xr 500 Mg Xr24h-tab (Metformin hcl) .... One tablet by mouth daily for blood sugar 5)  Prodigy Blood Glucose Monitor Devi (Blood glucose monitoring suppl) .... Use to check blood sugar daily 6)  Prodigy Blood Glucose Test Strp (Glucose blood) .... Use to check blood sugar daily before breakfast 7)  Prodigy Twist Top Lancets 28g Misc (Lancets) .... Use to check blood sugar daily  Other Orders: Tdap => 20yrs IM (62130) Admin 1st Vaccine (86578) Admin 1st Vaccine [facility]) [phone])  Diabetes Management Assessment/Plan:      Her blood pressure goal is < 130/80.    Hypertension Assessment/Plan:      The patient's hypertensive risk group is category C: Target organ damage and/or diabetes.  Today's blood pressure is 107/69.  Her blood pressure goal is < 130/80.  Patient Instructions: 1)   Follow up in 3 months for diabetes or sooner if necessary 2)  you will need cbg, hgba1c, flu vaccine, foot check. Prescriptions: GLUCOPHAGE XR 500 MG XR24H-TAB (METFORMIN HCL) One tablet by mouth daily for blood sugar  #30 x 5   Entered and Authorized by:   Lehman Prom FNP   Signed by:   Lehman Prom FNP on 06/20/2010   Method used:   Electronically to        [facility] [phone]* (retail)       [address]       [place], [place]  [address]       Ph: [phone] or  [phone]       Fax: [phone]   RxID:   [id number]    Tetanus/Td Vaccine    Vaccine Type: Tdap    Site: right deltoid    Mfr: Sanofi Pasteur    Dose: 0.25 ml    Route: IM    Given by: Levon Hedger    Exp. Date: 10/18/2012    Lot #: [id number]    VIS given: 09/23/07 version given June 20, 2010.    ndc  13244-010-27  Laboratory Results   Blood Tests   Date/Time Received: June 20, 2010 12:24 PM   HGBA1C: 6.2%   (Normal Range: Non-Diabetic - 3-6%   Control Diabetic - 6-8%) CBG Random:: 133

## 2010-12-05 NOTE — Assessment & Plan Note (Signed)
Summary: Vaginal discharge   Vital Signs:  Patient profile:   30 year old female Menstrual status:  depo Weight:      192.8 pounds BMI:     35.97 BSA:     1.87 Temp:     97.7 degrees F oral Pulse rate:   85 / minute Pulse rhythm:   regular Resp:     20 per minute BP sitting:   124 / 75  (left arm) Cuff size:   large  Vitals Entered By: Levon Hedger (April 07, 2010 11:15 AM) CC: vaginal discharge with itch x 4 days, Vaginal discharge, Depression Pain Assessment Patient in pain? no      CBG Result 127 CBG Device ID B  Does patient need assistance? Functional Status Self care Ambulation Normal   CC:  vaginal discharge with itch x 4 days, Vaginal discharge, and Depression.  History of Present Illness:  Pt into the office with complaints of vaginal itching  Obesity - down 3 pounds since last visit.  Pt has started to walk and increase her exercise.     Vaginal discharge      This is a 30 year old woman who presents with Vaginal discharge.  The symptoms began 4 days ago.  The severity is described as mild.  The patient complains of itching and frequency, but denies vaginal burning and burning on urination.  The discharge is described as clear.  Risk factors for vaginal discharge include diabetes.  The patient denies the following symptoms: genital sores, unusual vaginal bleeding, and rash.   Denies douching No OTC products No abdominal cramps No yogurt in her diet No body washes - uses regular soap  Diabetes Management History:      The patient is a 30 years old female who comes in for evaluation of DM Type 2.  She is (or has been) enrolled in the "Diabetic Education Program".  She states understanding of dietary principles and is following her diet appropriately.  No sensory loss is reported.  Self foot exams are not being performed.  She is not checking home blood sugars.  She says that she is exercising.  Type of exercise includes: walking.  Duration of exercise is  estimated to be <30  She is doing this 3 times per week.        No changes have been made to her treatment plan since last visit.     Allergies (verified): No Known Drug Allergies  Review of Systems GU:  Complains of discharge; clear discharge.  Physical Exam  General:  alert.   Head:  normocephalic.   Eyes:  glasses Genitalia:  self wet prep Neurologic:  gait normal.   Skin:  color normal.   Psych:  Oriented X3.     Impression & Recommendations:  Problem # 1:  CANDIDIASIS OF VULVA AND VAGINA (ICD-112.1) advised pt of dx hygiene inforced Her updated medication list for this problem includes:    Fluconazole 150 Mg Tabs (Fluconazole) ..... One tablet by mouth daily x 3 days  Orders: KOH/ WET Mount [phone]) UA Dipstick w/o Micro (manual) (60454)  Problem # 2:  DIABETES MELLITUS (ICD-250.00) continue current meds Her updated medication list for this problem includes:    Glucophage Xr 500 Mg Xr24h-tab (Metformin hcl) ..... One tablet by mouth daily for blood sugar  Orders: Capillary Blood Glucose/CBG (09811)  Complete Medication List: 1)  Depo-provera 150 Mg/ml Im Susp (Medroxyprogesterone acetate) .... Im q 12 weeks 2)  Lac-hydrin 12 %  Lotn (Ammonium lactate) .... One application topically two times a day to affected area 3)  Ibuprofen 800 Mg Tabs (Ibuprofen) .... One tablet by mouth two times a day as needed for pain 4)  Glucophage Xr 500 Mg Xr24h-tab (Metformin hcl) .... One tablet by mouth daily for blood sugar 5)  Prodigy Blood Glucose Monitor Devi (Blood glucose monitoring suppl) .... Use to check blood sugar daily 6)  Prodigy Blood Glucose Test Strp (Glucose blood) .... Use to check blood sugar daily before breakfast 7)  Prodigy Twist Top Lancets 28g Misc (Lancets) .... Use to check blood sugar daily 8)  Fluconazole 150 Mg Tabs (Fluconazole) .... One tablet by mouth daily x 3 days  Diabetes Management Assessment/Plan:      Her blood pressure goal is < 130/80.     Patient Instructions: 1)  Yeast infection - take diflucan 150mg  by mouth daily for 3 days 2)  Remember to wipe from front to back 3)  drink plenty of water 4)  Wear white cotton underwear 5)  Start eating yogurt at least every other day Prescriptions: FLUCONAZOLE 150 MG TABS (FLUCONAZOLE) One tablet by mouth daily x 3 days  #3 x 0   Entered and Authorized by:   Lehman Prom FNP   Signed by:   Lehman Prom FNP on 04/07/2010   Method used:   Print then Give to Patient   RxID:   [id number]     Laboratory Results   Urine Tests  Date/Time Received: April 07, 2010 12:01 PM   Routine Urinalysis   Color: lt. yellow Appearance: Clear Glucose: negative   (Normal Range: Negative) Bilirubin: negative   (Normal Range: Negative) Ketone: negative   (Normal Range: Negative) Spec. Gravity: 1.015   (Normal Range: 1.003-1.035) Blood: negative   (Normal Range: Negative) pH: 5.5   (Normal Range: 5.0-8.0) Protein: negative   (Normal Range: Negative) Urobilinogen: 0.2   (Normal Range: 0-1) Nitrite: negative   (Normal Range: Negative) Leukocyte Esterace: trace   (Normal Range: Negative)     Blood Tests     CBG Random:: 127mg /dL  Date/Time Received: April 07, 2010 11:54 AM   [employer] [place] Source: vaginal WBC/hpf: 1-5 Bacteria/hpf: rare Clue cells/hpf: none Yeast/hpf: moderate Wet Mount KOH: Negative Trichomonas/hpf: none

## 2010-12-07 NOTE — Progress Notes (Signed)
Summary: Vaginal itching  Phone Note Call from Patient   Summary of Call: please call her at [phone] she has a question about medication Initial call taken by: Domenic Polite,  November 03, 2010 4:00 PM  Follow-up for Phone Call        First noticed symptoms second or third week of December -- has had mild itching with discharge yesterday, not since, thick, clear, no odor, itching only occurs with discharge.  Did have odor, not currently.  Has not tried any new detergents or soaps.  Had sexual relations Monday night, noticed symptoms day before, had vaginal pain before and after intercourse.  Denies fever, had some abdominal pain which has resolved.  Denies bleeding.  Tried Monistat-7 day treatment, two weeks later, symptoms returned yesterday.    Advised as to protocol for vaginal issues -- use of aveeno or vagisil feminine wash, no douching, avoid tight clothing, wear cotton or cotton-lined underpants and hose, avoid any scented products in vaginal area, double-rinse clothing to remove detergent, take showers not baths, use appropriate perineal hygiene. Verbalized understanding -- will call back for appt. if symptoms persist.   Had similar symptoms in October -- do you want to call something in for her? Follow-up by: Dutch Quint RN,  November 03, 2010 4:17 PM  Additional Follow-up for Phone Call Additional follow up Details #1::        Good advise - pt does have this recurrently and not that her blood sugar is controlled I try to stress hygiene as primary means of prevention.  Additional Follow-up by: Lehman Prom FNP,  November 03, 2010 4:33 PM

## 2010-12-07 NOTE — Medication Information (Signed)
Summary: [facility] [facility] -ORDER//FAXED  [facility] [facility] -ORDER//FAXED   Imported By: Arta Bruce 11/21/2010 10:44:01  _____________________________________________________________________  External Attachment:    Type:   Image     Comment:   External Document

## 2010-12-11 ENCOUNTER — Encounter: Payer: Self-pay | Admitting: Nurse Practitioner

## 2010-12-11 ENCOUNTER — Encounter (INDEPENDENT_AMBULATORY_CARE_PROVIDER_SITE_OTHER): Payer: Self-pay | Admitting: Nurse Practitioner

## 2010-12-11 LAB — CONVERTED CEMR LAB
Bilirubin Urine: NEGATIVE
Blood Glucose, Fingerstick: 123
Blood in Urine, dipstick: NEGATIVE
Protein, U semiquant: NEGATIVE
Specific Gravity, Urine: 1.015
pH: 7

## 2010-12-21 ENCOUNTER — Encounter: Payer: Self-pay | Admitting: Nurse Practitioner

## 2010-12-21 ENCOUNTER — Encounter (INDEPENDENT_AMBULATORY_CARE_PROVIDER_SITE_OTHER): Payer: Self-pay | Admitting: Nurse Practitioner

## 2010-12-21 LAB — CONVERTED CEMR LAB
Bilirubin Urine: NEGATIVE
Blood Glucose, Fingerstick: 133
Blood in Urine, dipstick: NEGATIVE
Glucose, Urine, Semiquant: NEGATIVE
Ketones, urine, test strip: NEGATIVE
Specific Gravity, Urine: 1.01
Urobilinogen, UA: 0.2

## 2010-12-21 NOTE — Letter (Signed)
Summary: Handout Printed  Printed Handout:  - Muscle Strain (Pulled Muscle) 

## 2010-12-21 NOTE — Assessment & Plan Note (Signed)
Summary: Back Pain   Vital Signs:  Patient profile:   30 year old female Menstrual status:  depo Weight:      180.44 pounds Temp:     98.3 degrees F oral Pulse rate:   76 / minute Pulse rhythm:   regular Resp:     18 per minute BP sitting:   118 / 64  (left arm) Cuff size:   regular  Vitals Entered By: Hale Drone CMA (December 11, 2010 1:03 PM) CC: having lower back and side pains x4 days. Not taking any meds to alleviate the pain. The pain is intermittent and will get worse when moving. , Back Pain Is Patient Diabetic? Yes Pain Assessment Patient in pain? no      CBG Result 123 CBG Device ID A non fasting  Does patient need assistance? Functional Status Self care Ambulation Normal   CC:  having lower back and side pains x4 days. Not taking any meds to alleviate the pain. The pain is intermittent and will get worse when moving.  and Back Pain.  History of Present Illness: Pt into the office with c/o back pain Pt has been seen for back pain in the past that improved and has now returned  Back Pain History:      The patient's back pain has been present for < 6 weeks.  The pain is located in the lower back region and does not radiate below the knees.  She states this is not work related.  She states that she has had a prior history of back pain.  The patient has not had any recent physical therapy for her back pain.        Other comments:  Pain comes and goes, it is not there all the time.  Present more when laying down. 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    Critical Exclusionary Diagnosis Criteria (CEDC) for Back Pain:      The patient denies a history of previous trauma.  She has no prior history of spinal surgery.  There are no symptoms to suggest infection, cancer, cauda equina, or psychosocial factors for back pain.  Other positive CEDC factors include low back pain worse with activity.     Current Medications (verified): 1)  Depo-Provera 150 Mg/ml Im  Susp (Medroxyprogesterone Acetate) .... Im Q 12 Weeks 2)  Ibuprofen 800 Mg Tabs (Ibuprofen) .... One Tablet By Mouth Two Times A Day As Needed For Pain 3)  Glucophage Xr 500 Mg Xr24h-Tab (Metformin Hcl) .... One Tablet By Mouth Daily For Blood Sugar 4)  Prodigy Blood Glucose Monitor  Devi (Blood Glucose Monitoring Suppl) .... Use To Check Blood Sugar Daily 5)  Prodigy Blood Glucose Test  Strp (Glucose Blood) .... Use To Check Blood Sugar Daily Before Breakfast 6)  Prodigy Twist Top Lancets 28g  Misc (Lancets) .... Use To Check Blood Sugar Daily 7)  Dex Pc 04-06-14 Mg/6ml Syrp (Phenylephrine-Chlorphen-Dm) .Marland Kitchen.. 1-2 Teaspoons Every 6 Hours As Needed For Cough  Allergies (verified): No Known Drug Allergies  Review of Systems General:  Denies fever. CV:  Denies chest pain or discomfort. Resp:  Denies cough. GI:  Denies abdominal pain, nausea, and vomiting. GU:  Complains of discharge; recurrent yeast infections - pt has been treated multiple times for recurrent yeast infections. she has been taken off depoprovera and started on oral contraceptives.  Physical Exam  General:  well-nourished.   Head:  normocephalic.   Eyes:  glasses Lungs:  normal breath sounds.   Heart:  normal rate and regular rhythm.    Low Back Pain Physical Exam:    Inspection-deformity:     No    Palpation-spinal tenderness:   No   Detailed Back/Spine Exam  Lumbosacral Exam:  Inspection-deformity:    Normal Palpation-spinal tenderness:  Normal   Impression & Recommendations:  Problem # 1:  BACK PAIN (ICD-724.5) advisd pt that she likely has strained a muscle she should take anti-inflammatory The following medications were removed from the medication list:    Ibuprofen 800 Mg Tabs (Ibuprofen) ..... One tablet by mouth two times a day as needed for pain Her updated medication list for this problem includes:    Naprosyn 500 Mg Tabs (Naproxen) ..... One tablet by mouth two times a day as needed for back  pain  Problem # 2:  DIABETES MELLITUS (ICD-250.00) reviewed with pt the need to continue to take meds Her updated medication list for this problem includes:    Glucophage Xr 500 Mg Xr24h-tab (Metformin hcl) ..... One tablet by mouth daily for blood sugar  Orders: Capillary Blood Glucose/CBG (40981) UA Dipstick w/o Micro (automated)  (81003)  Problem # 3:  CANDIDIASIS OF VULVA AND VAGINA (ICD-112.1) recurrent for pt she has tried OTC meds - monistat without relief ? if problem will improve since she has started her cycle advised condom use over the next month  Complete Medication List: 1)  Glucophage Xr 500 Mg Xr24h-tab (Metformin hcl) .... One tablet by mouth daily for blood sugar 2)  Prodigy Blood Glucose Monitor Devi (Blood glucose monitoring suppl) .... Use to check blood sugar daily 3)  Prodigy Blood Glucose Test Strp (Glucose blood) .... Use to check blood sugar daily before breakfast 4)  Prodigy Twist Top Lancets 28g Misc (Lancets) .... Use to check blood sugar daily 5)  Naprosyn 500 Mg Tabs (Naproxen) .... One tablet by mouth two times a day as needed for back pain 6)  Terazol 7 0.4 % Crea (Terconazole) .... One applicator intravaginally at night for 5 nights  Patient Instructions: 1)  You have a pulled muscle in your lower back.  This likely comes fro repeatedly lifting your daughter.  You may take naprosyn 500mg  by mouth two times a day for pain.  Take at least for the next 3 days. Do not take ibuprofen with this 2)  Yeast - try to use condoms for the next month to see if problem improves.  You may see some improvement after you start having your period again but this may take some time Prescriptions: TERAZOL 7 0.4 % CREA (TERCONAZOLE) One applicator intravaginally at night for 5 nights  #45gm x 0   Entered and Authorized by:   Lehman Prom FNP   Signed by:   Lehman Prom FNP on 12/11/2010   Method used:   Print then Give to Patient   RxID:   [id number] NAPROSYN  500 MG TABS (NAPROXEN) One tablet by mouth two times a day as needed for back pain  #30 x 0   Entered and Authorized by:   Lehman Prom FNP   Signed by:   Lehman Prom FNP on 12/11/2010   Method used:   Print then Give to Patient   RxID:   [id number]    Orders Added: 1)  Capillary Blood Glucose/CBG [44010] 2)  UA Dipstick w/o Micro (automated)  [81003] 3)  Est. Patient Level III [27253]     Laboratory Results   Urine Tests  Date/Time  Received: December 11, 2010 1:21 PM   Routine Urinalysis   Color: lt. yellow Glucose: negative   (Normal Range: Negative) Bilirubin: negative   (Normal Range: Negative) Ketone: negative   (Normal Range: Negative) Spec. Gravity: 1.015   (Normal Range: 1.003-1.035) Blood: negative   (Normal Range: Negative) pH: 7.0   (Normal Range: 5.0-8.0) Protein: negative   (Normal Range: Negative) Urobilinogen: 0.2   (Normal Range: 0-1) Nitrite: negative   (Normal Range: Negative) Leukocyte Esterace: trace   (Normal Range: Negative)     Blood Tests     CBG Random:: 123mg /dL

## 2010-12-26 ENCOUNTER — Telehealth (INDEPENDENT_AMBULATORY_CARE_PROVIDER_SITE_OTHER): Payer: Self-pay | Admitting: Nurse Practitioner

## 2010-12-27 NOTE — Assessment & Plan Note (Signed)
Summary: Diabetes   Vital Signs:  Patient profile:   30 year old female Menstrual status:  depo Weight:      179.1 pounds BMI:     33.41 Temp:     97.4 degrees F oral Pulse rate:   76 / minute Pulse rhythm:   regular Resp:     20 per minute BP sitting:   110 / 66  (left arm) Cuff size:   regular  Vitals Entered By: Levon Hedger (December 21, 2010 11:22 AM)  Nutrition Counseling: Patient's BMI is greater than 25 and therefore counseled on weight management options. CC: follow-up visit DM, Abdominal Pain, Back Pain Is Patient Diabetic? Yes Pain Assessment Patient in pain? no      CBG Result 133 CBG Device ID A  Does patient need assistance? Functional Status Self care Ambulation Normal   CC:  follow-up visit DM, Abdominal Pain, and Back Pain.  History of Present Illness:  Pt into the office for f/u on diabetes. She checks her blood sugar once per day at home.  Back Pain History:            Other comments:  Back pain is doing better since last visit in this office for which she was treated.    Diabetes Management History:      The patient is a 30 years old female who comes in for evaluation of DM Type 2.  She is (or has been) enrolled in the "Diabetic Education Program".  She states understanding of dietary principles and is following her diet appropriately.  No sensory loss is reported.  Self foot exams are not being performed.  She is checking home blood sugars.  She says that she is exercising.  Type of exercise includes: walking.  Duration of exercise is estimated to be <30  She is doing this 3 times per week.        Hypoglycemic symptoms are not occurring.  No hyperglycemic symptoms are reported.        No changes have been made to her treatment plan since last visit.    Dyspepsia History:      She has no alarm features of dyspepsia including no history of melena, hematochezia, dysphagia, persistent vomiting, or involuntary weight loss > 5%.  There is a prior  history of GERD.  The patient does not have a prior history of documented ulcer disease.  The dominant symptom is not heartburn or acid reflux.  An H-2 blocker medication is not currently being taken.      Habits & Providers  Alcohol-Tobacco-Diet     Alcohol drinks/day: 0     Tobacco Status: never     Diet Comments: low calorie and low cholesterol diet  Exercise-Depression-Behavior     Does Patient Exercise: no     Exercise Counseling: to improve exercise regimen     Type of exercise: walking     Exercise (avg: min/session): <30     Times/week: 3     Depression Counseling: not indicated; screening negative for depression     Seat Belt Use: 100     Sun Exposure: occasionally  Comments: Still no committment to exercise   Allergies (verified): No Known Drug Allergies  Social History: Does Patient Exercise:  no  Review of Systems General:  Denies fever. CV:  Denies chest pain or discomfort. Resp:  Denies cough. GI:  Denies indigestion. MS:  Denies joint pain.  Physical Exam  General:  alert.   Head:  normocephalic.  Eyes:  glasses  Lungs:  normal breath sounds.   Heart:  normal rate and regular rhythm.   Abdomen:  normal bowel sounds.   Msk:  normal ROM.   Neurologic:  alert & oriented X3.   Skin:  color normal.   Psych:  Oriented X3.     Impression & Recommendations:  Problem # 1:  DIABETES MELLITUS (ICD-250.00) stable last hbga1c = 6.1 pt to continue to check blood sugar daily and to continue taking meds Her updated medication list for this problem includes:    Glucophage Xr 500 Mg Xr24h-tab (Metformin hcl) ..... One tablet by mouth daily for blood sugar  Orders: Capillary Blood Glucose/CBG (04540) UA Dipstick w/o Micro (manual) (98119)  Problem # 2:  OBESITY (ICD-278.00) Pt has still not started an exercise routine advised her to do so on this and every visit  Problem # 3:  GERD (ICD-530.81) handout given on foods that cause acid reflux  Complete  Medication List: 1)  Glucophage Xr 500 Mg Xr24h-tab (Metformin hcl) .... One tablet by mouth daily for blood sugar 2)  Prodigy Blood Glucose Monitor Devi (Blood glucose monitoring suppl) .... Use to check blood sugar daily 3)  Prodigy Blood Glucose Test Strp (Glucose blood) .... Use to check blood sugar daily before breakfast 4)  Prodigy Twist Top Lancets 28g Misc (Lancets) .... Use to check blood sugar daily 5)  Naprosyn 500 Mg Tabs (Naproxen) .... One tablet by mouth two times a day as needed for back pain  Diabetes Management Assessment/Plan:      Her blood pressure goal is < 130/80.    Patient Instructions: 1)  Diabetes - keep checking your blood sugar daily before breakfast.  It should be between 80 and 120. 2)  Try to get some type of exercise in your routine.  This will be great for both your weight and your blood sugar.   3)  Follow up in 3 months for diabetes.  You will need Hgba1c, u/a, cbg.   Orders Added: 1)  Capillary Blood Glucose/CBG [82948] 2)  Est. Patient Level III [14782] 3)  UA Dipstick w/o Micro (manual) [81002]    Laboratory Results   Urine Tests  Date/Time Received: December 21, 2010 11:36 AM   Routine Urinalysis   Color: lt. yellow Appearance: Clear Glucose: negative   (Normal Range: Negative) Bilirubin: negative   (Normal Range: Negative) Ketone: negative   (Normal Range: Negative) Spec. Gravity: 1.010   (Normal Range: 1.003-1.035) Blood: negative   (Normal Range: Negative) pH: 6.5   (Normal Range: 5.0-8.0) Protein: negative   (Normal Range: Negative) Urobilinogen: 0.2   (Normal Range: 0-1) Nitrite: negative   (Normal Range: Negative) Leukocyte Esterace: trace   (Normal Range: Negative)     Blood Tests     CBG Random:: 133

## 2011-01-02 NOTE — Progress Notes (Signed)
Summary: Slammed finger in door  Phone Note Call from Patient   Summary of Call: Slammed finger in the door about a week ago, doesn't remember if it was a regular door or car door.  Finger swelled badly, has not decreased.  Had only wrapped it up, not applied ice, took some ibuprofen, still is painful.  Doesn't have any bruising, redness or drainage, skin is intact, only pad of right first finger is swollen.    Advised to elevate right forearm when not using, do warm soaks x20 minutes every couple of hours for comfort, watch for drainage or change in color.  Also advised that nail may come off,  to cover with bandage if that occurs.   Take ibuprofen or alleve every 6 hours with food to reduce inflammation and pain, may wrap finger lightly to protect from bumping.  To call back if swelling persists or drainage/redness occurs more than a few more days.  Initial call taken by: Dutch Quint RN,  December 26, 2010 12:55 PM  Follow-up for Phone Call        i saw pt on 12/21/2010 with no mention of above problem. good advise - expect some soft tissue swelling with this type of problem. If persists over the next week then she will need f/u Follow-up by: Lehman Prom FNP,  December 26, 2010 1:43 PM

## 2011-02-11 LAB — POCT URINALYSIS DIP (DEVICE)
Bilirubin Urine: NEGATIVE
Glucose, UA: NEGATIVE mg/dL
Protein, ur: NEGATIVE mg/dL
pH: 6.5 (ref 5.0–8.0)

## 2011-02-11 LAB — WET PREP, GENITAL
Clue Cells Wet Prep HPF POC: NONE SEEN
Trich, Wet Prep: NONE SEEN
WBC, Wet Prep HPF POC: NONE SEEN
Yeast Wet Prep HPF POC: NONE SEEN

## 2011-02-11 LAB — URINE CULTURE

## 2011-02-11 LAB — GC/CHLAMYDIA PROBE AMP, GENITAL: Chlamydia, DNA Probe: NEGATIVE

## 2011-03-23 NOTE — H&P (Signed)
NAMESAGA, BALTHAZAR                ACCOUNT NO.:  [id number]   MEDICAL RECORD NO.:  [id number]          PATIENT TYPE:  INP   LOCATION:  9161                          FACILITY:  [facility]   PHYSICIAN:  Roseanna Rainbow, M.D.DATE OF BIRTH:  13-Sep-1981   DATE OF ADMISSION:  05/27/2006  DATE OF DISCHARGE:                                HISTORY & PHYSICAL   CHIEF COMPLAINT:  The patient is a 30 year old, gravida 1 para 0, with an  estimated date of confinement of September 23, 2006, with an intrauterine  pregnancy at 23 weeks, complaining of pelvic pressure.   HISTORY OF PRESENT ILLNESS:  Please see the above.  The patient has had the  above complaints for approximately one week.  Her exam one week ago in the  office was long, closed, and posterior.  She had minor trauma the day before  presentation.  She also complained of thin discharge.  She denied rupture of  membranes.   ALLERGIES:  NO KNOWN DRUG ALLERGIES.   MEDICATIONS:  Prenatal vitamins.   CURRENT RISK FACTOR:  Sickle cell trait, maternal ovarian cyst.  She has a  history of a positive PPD and received prophylactic medications in 1997.   LABORATORY DATA:  Chlamydia negative, GC negative.  Urine culture and  sensitivity, no growth.  Blood type is O positive, antibody screen negative.  Hemoglobin 13.2, hematocrit 40.6, platelets 304,000.  Rubella nonimmune.  Sickle cell positive.   PAST MEDICAL HISTORY:  A remote history of sexually-transmitted infection,  the patient is unclear as to the type, and iron deficiency anemia.   PAST GYN HISTORY:  Please see the above.  No history of obstetrical or  gynecological surgeries.  Last Pap smear June of 2006 and was normal.   PAST SURGICAL HISTORY:  She had surgery as an infant.  She is unsure of the  type but she had a laparotomy.   SOCIAL HISTORY:  She is a Futures trader.  She lives with her grandmother.  She  does not give any significant history of alcohol usage.  She has no  significant smoking history.  She denies illicit drug use.   FAMILY HISTORY:  Adult onset diabetes, genetic history sickle cell trait.   PHYSICAL EXAMINATION:  VITAL SIGNS:  Blood pressure 121/61, fetal heart rate  140s.  PELVIC:  The cervix is 3 cm dilated, likely hourglassing membranes, vertex  presentation.   ASSESSMENT:  Intrauterine pregnancy at 23+ weeks, rule out cervical  incompetence versus threatened pre-term labor.   PLAN:  Admission, bedrest, broad spectrum parenteral antibiotics,  tocolytics, steroids.      Roseanna Rainbow, M.D.  Electronically Signed     LAJ/MEDQ  D:  05/27/2006  T:  05/27/2006  Job:  [id number]

## 2011-03-23 NOTE — Discharge Summary (Signed)
Vicki Mccoy, Vicki Mccoy                ACCOUNT NO.:  [id number]   MEDICAL RECORD NO.:  [id number]          PATIENT TYPE:  INP   LOCATION:                                FACILITY:  [facility]   PHYSICIAN:  Charles A. Clearance Coots, M.D.DATE OF BIRTH:  Mar 11, 1981   DATE OF ADMISSION:  05/27/2006  DATE OF DISCHARGE:  06/01/2006                               DISCHARGE SUMMARY   ADMITTING DIAGNOSES:  1. Twenty-three weeks' gestation.  2. Threatened preterm labor.  3. Rule out cervical incompetence.   DISCHARGE DIAGNOSES:  1. Twenty-three weeks' gestation.  2. Threatened preterm labor.  3. Rule out cervical incompetence.  4. Status post preterm delivery, breech presentation, female infant on      May 30, 2006 at 16:21. Apgars of 2 at one minute, 3 at five      minutes, 6 at ten minutes.  Infant was taken to the neonatal      intensive care unit.  Mother discharged home in good condition.   REASON FOR ADMISSION:  A 30 year old G1, estimated date of confinement  of September 23, 2006, intrauterine pregnancy at 23 weeks, presented with  complaint of pelvic pressure.  The patient had had the complaints of  pelvic pressure for approximately one week, seen in the office  approximately one week ago, and on examination, the cervix was long,  closed and posterior.  The patient had complained of minor trauma the  day before her admission.  She also complained of thin discharge.  She  denied rupture of membranes.   PAST MEDICAL HISTORY:  She had surgery as an infant, unsure of the type,  but it was a laparotomy.  Illnesses:  Remote history of sexually  transmitted infection.  The patient is unclear of the type.  Positive  history of iron deficiency anemia   MEDICATIONS:  Prenatal vitamins.   ALLERGIES:  No known drug allergies.   GYNECOLOGICAL HISTORY:  Positive history of sexually transmitted  disease.  No history of obstetrical or gynecologic surgery.  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  Does  not give any significant history of alcohol, tobacco or recreational  drug use.   FAMILY HISTORY:  Positive for adult onset diabetes and sickle cell  trait.   CURRENT RISK FACTORS:  Sickle cell trait, maternal ovarian cyst,  positive PPD which was treated with prophylactic medications in 1977.   PHYSICAL EXAMINATION:  Well-nourished, well-developed female in no acute  distress.  Blood pressure 121/61.  Fetal heart rate 140 to 150 beats per  minute.  LUNGS:  Were clear to auscultation bilaterally.  HEART:  Regular rate and rhythm.  ABDOMEN:  Gravid, nontender.  CERVIX:  3 cm dilated with hourglassing membranes presenting in the  cervical os, vertex presentation.   IMPRESSION:  Twenty-three weeks' gestation, rule out cervical  incompetence versus threatened preterm labor.   PLAN:  Admit, bedrest, start broad-spectrum parenteral antibiotics,  tocolysis and fetal lung augmentation with steroids.   ADMITTING LABORATORY VALUES:  Hemoglobin 11.8,  hematocrit 35.1, white  blood cell count 9800, platelets 268,000.  RPR was nonreactive.  Ultrasound:  Complete obstetrical ultrasound revealed normal anatomy,  amniotic fluid volume was low normal, presentation was cephalic,  cervical length was 0 cm transabdominally, bulging membranes observed  through the cervix into the vaginal apex.  Assigned gestational age was  23-weeks and 2-days gestation.   HOSPITAL COURSE:  The patient was admitted, placed on bedrest in  Trendelenburg position, started on parenteral antibiotics and magnesium  sulfate tocolysis, along with p.o. ibuprofen.  She was also given  betamethasone for augmentation of fetal lung maturity, 2 doses 24 hours  apart.  The patient responded initially well to bedrest and tocolysis.  On day of 3, noticed increased cramping and bloating.  On examination,  the breech presentation was noted in the vaginal vault,  and the patient  proceeded to deliver by complete breech extraction without  complications.  Apgars were 2 at one minute, 3 at five, 6 at ten  minutes.  The placenta was delivered intact spontaneously.  The  patient's postpartum course was uncomplicated, and she was discharged  home on postpartum day #2 in good condition.   LABORATORY VALUES:  Hemoglobin 10, hematocrit 30, white blood cell count  11,500, platelets 249,000.   DISCHARGE DISPOSITION:  MEDICATIONS:  Ibuprofen was prescribed for pain.  Continue prenatal vitamins.  Routine written instructions were given for  discharge after vaginal delivery.  The patient is to call the office for  a follow-up appointment in 2 weeks.      Charles A. Clearance Coots, M.D.  Electronically Signed     CAH/MEDQ  D:  02/25/2007  T:  02/25/2007  Job:  [id number]

## 2011-08-09 LAB — RAPID STREP SCREEN (MED CTR MEBANE ONLY): Streptococcus, Group A Screen (Direct): POSITIVE — AB

## 2011-10-29 IMAGING — CR DG HAND COMPLETE 3+V*R*
3 series · 3 of 3 positions shown · non-contrast
Comparison: None

CLINICAL DATA: Trauma with pain in the middle finger.

RIGHT HAND - COMPLETE 3+ VIEW

[x hand pa right]
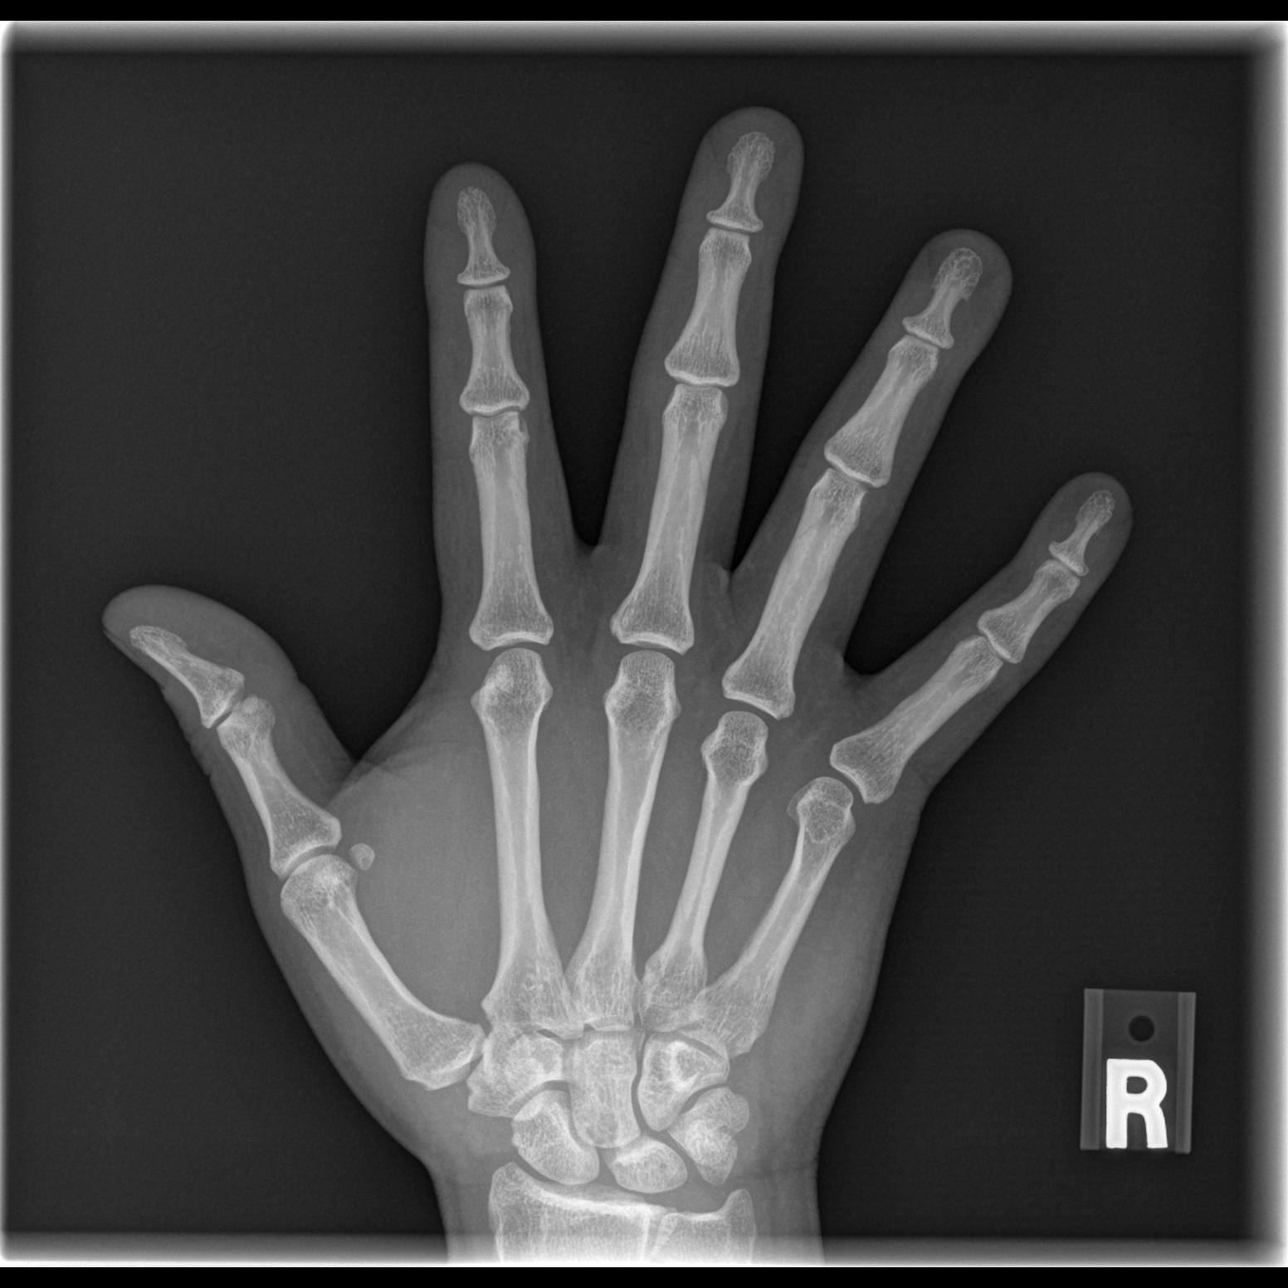

[x hand oblique right]
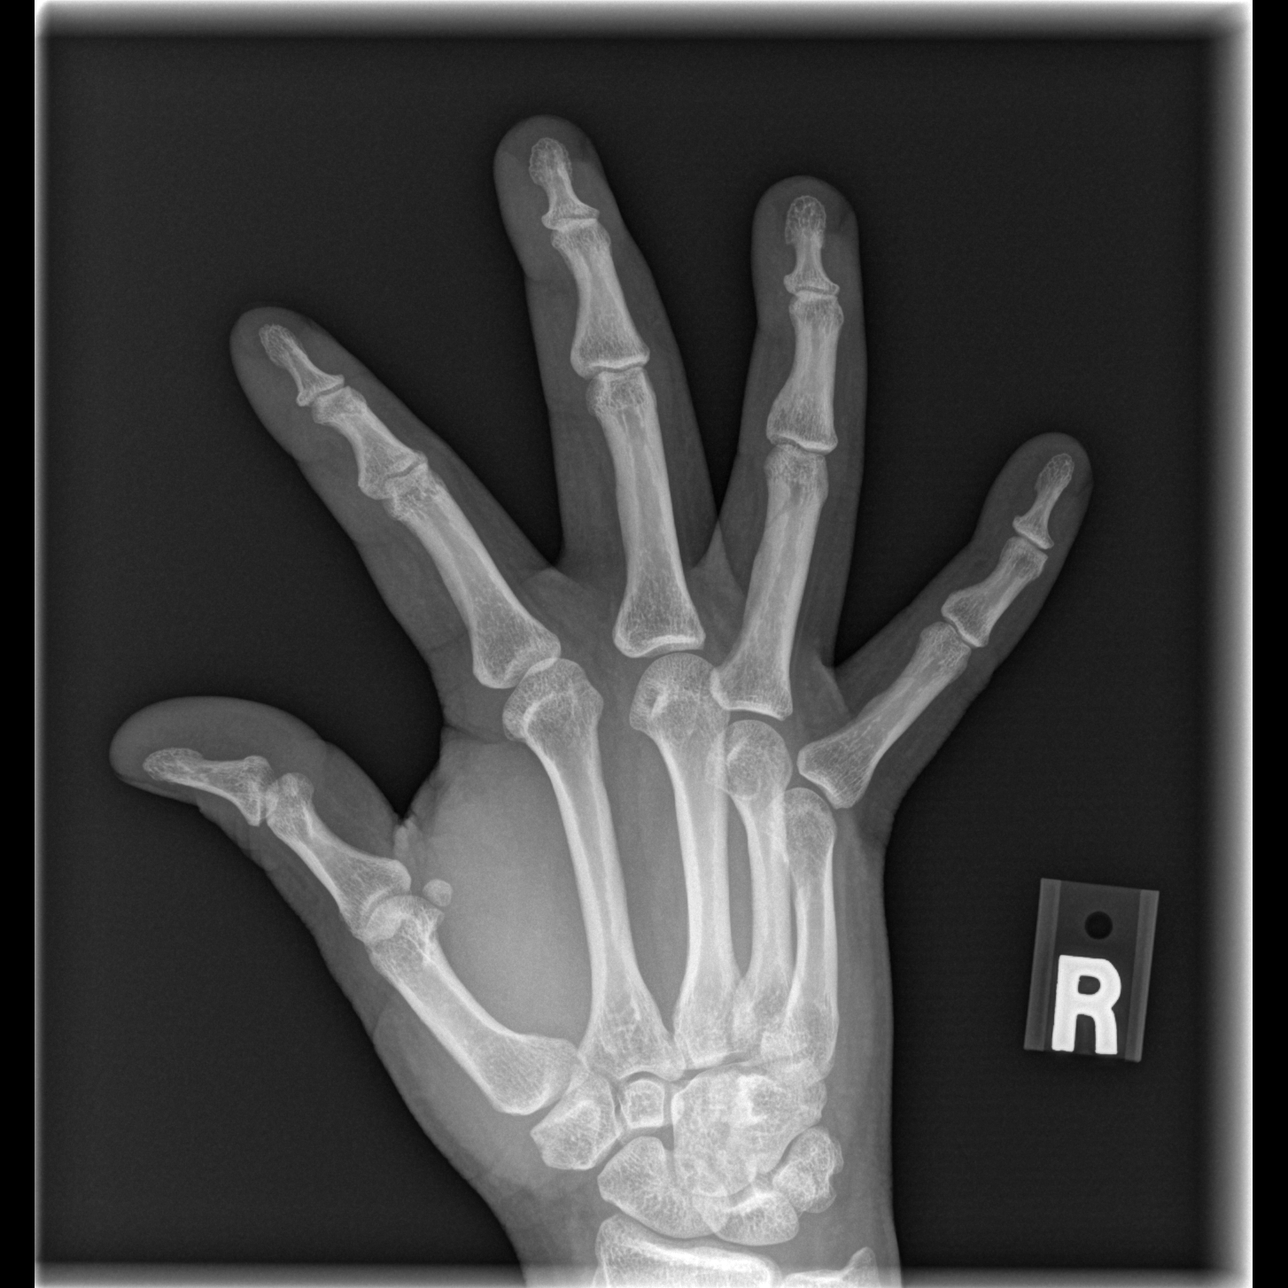

[x hand lat right]
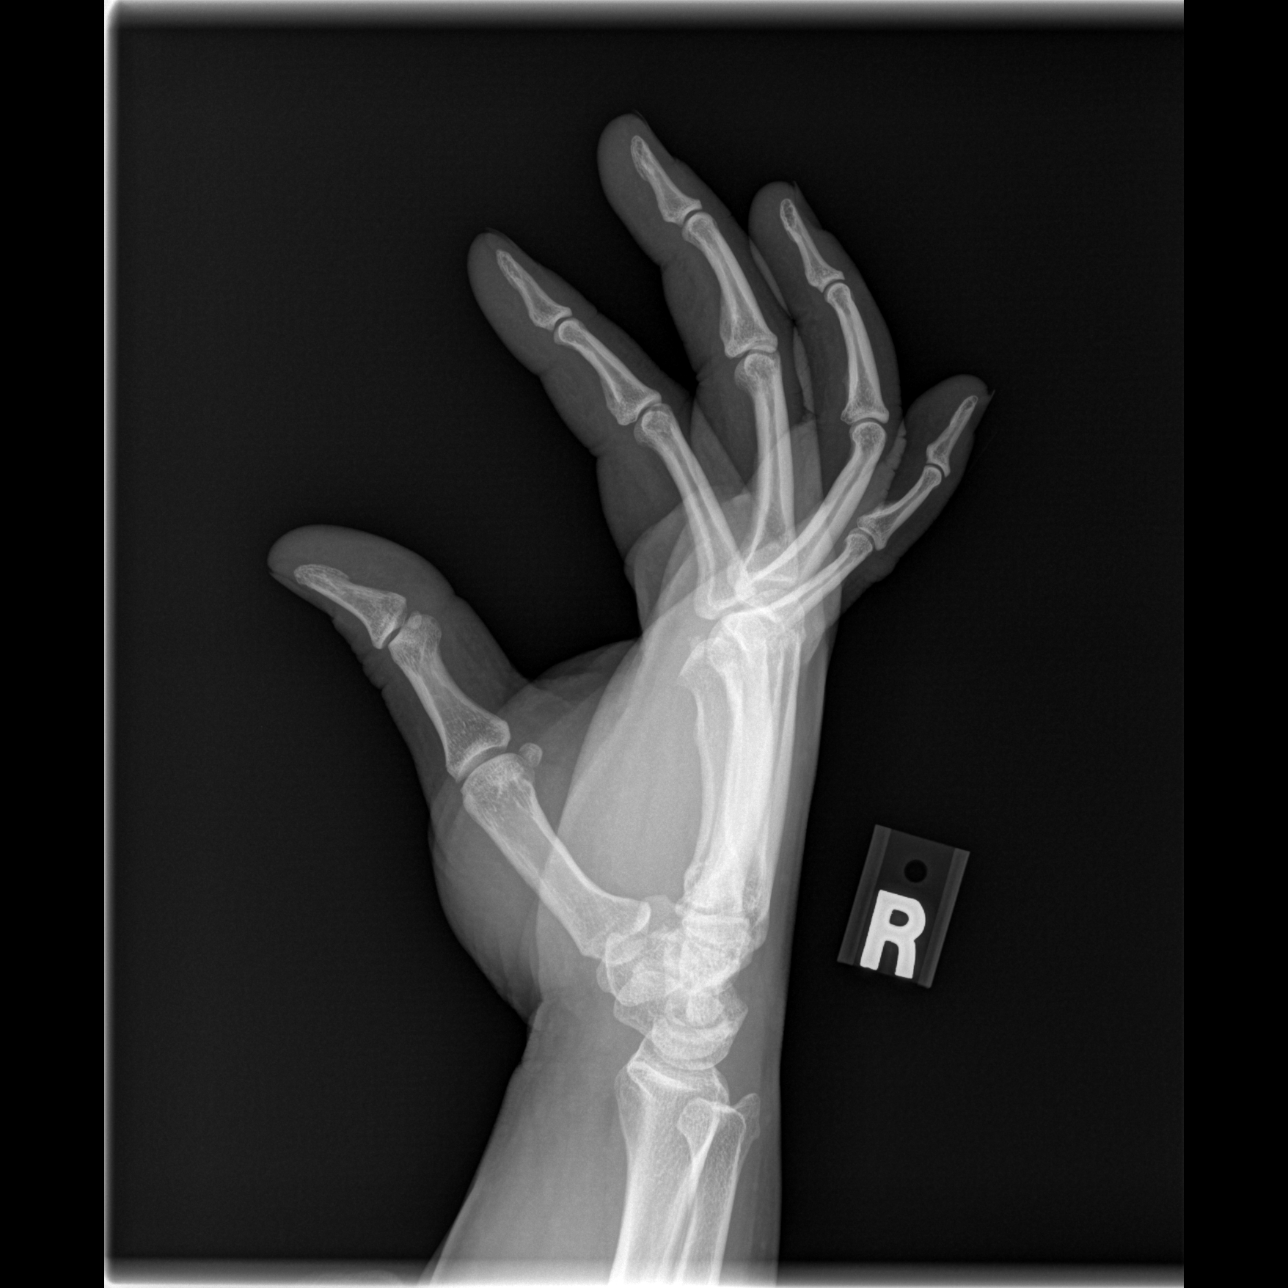

[3 of 3 positions shown; findings below may reference images not displayed]

FINDINGS: There is mild soft tissue swelling but no discernible
fracture, dislocation or other focal lesion.
IMPRESSION: Mild soft tissue swelling without bony abnormality.

## 2012-02-18 ENCOUNTER — Other Ambulatory Visit: Payer: Self-pay | Admitting: Family Medicine

## 2012-02-18 ENCOUNTER — Other Ambulatory Visit (HOSPITAL_COMMUNITY)
Admission: RE | Admit: 2012-02-18 | Discharge: 2012-02-18 | Disposition: A | Discharge status: Home / Self Care | Payer: Medicare Other | Source: Ambulatory Visit | Attending: Family Medicine | Admitting: Family Medicine

## 2012-02-18 DIAGNOSIS — N760 Acute vaginitis: Secondary | ICD-10-CM | POA: Diagnosis not present

## 2012-02-18 DIAGNOSIS — N909 Noninflammatory disorder of vulva and perineum, unspecified: Secondary | ICD-10-CM | POA: Diagnosis not present

## 2012-02-18 DIAGNOSIS — Z124 Encounter for screening for malignant neoplasm of cervix: Secondary | ICD-10-CM | POA: Diagnosis not present

## 2012-02-18 DIAGNOSIS — K5900 Constipation, unspecified: Secondary | ICD-10-CM | POA: Diagnosis not present

## 2012-02-18 DIAGNOSIS — N739 Female pelvic inflammatory disease, unspecified: Secondary | ICD-10-CM | POA: Diagnosis not present

## 2012-03-13 DIAGNOSIS — N760 Acute vaginitis: Secondary | ICD-10-CM | POA: Diagnosis not present

## 2012-03-13 DIAGNOSIS — Z0000 Encounter for general adult medical examination without abnormal findings: Secondary | ICD-10-CM | POA: Diagnosis not present

## 2012-03-24 DIAGNOSIS — N912 Amenorrhea, unspecified: Secondary | ICD-10-CM | POA: Diagnosis not present

## 2012-03-31 ENCOUNTER — Ambulatory Visit: Payer: Medicare Other

## 2012-03-31 ENCOUNTER — Ambulatory Visit (INDEPENDENT_AMBULATORY_CARE_PROVIDER_SITE_OTHER): Payer: Medicare Other | Admitting: Family Medicine

## 2012-03-31 VITALS — BP 132/80 | HR 71 | Temp 98.5°F | Resp 16 | Ht 62.25 in | Wt 172.2 lb

## 2012-03-31 DIAGNOSIS — M545 Low back pain: Secondary | ICD-10-CM

## 2012-03-31 MED ORDER — CYCLOBENZAPRINE HCL 5 MG PO TABS: 5.0000 mg | ORAL_TABLET | Freq: Three times a day (TID) | ORAL | Status: AC | PRN

## 2012-03-31 NOTE — Progress Notes (Signed)
  Subjective:    Patient ID: Vicki Mccoy, female    DOB: 01-08-1981, 31 y.o.   MRN: [id number]  HPI 31 yo female with history of diabetes and back problems here with back pain. LBP for awhile.  Takes naprosyn from her PCP at [facility].  Helps for awhile and pain comes back.  Going on about 3 months.  Has had trouble before off and on but not as bad.  Never took anything for it before.  Switches sides but left is worst side.  Will go down left leg.  Sitting and bending make it feel worse.  ONly naproxen helps it feel better.  Never done heat or ice or physical therapy.  No known injury or overuse.  Doesn't work.  Doesn't do heavy lifting or work out.  Naproxen is only thing she has ever tried.     Review of Systems Negative except as per HPI     Objective:   Physical Exam  Constitutional: Vital signs are normal. She appears well-developed and well-nourished. She is active.  Non-toxic appearance. She does not appear ill.  Cardiovascular: Normal rate, regular rhythm, normal heart sounds and normal pulses.   Pulmonary/Chest: Effort normal and breath sounds normal.  Musculoskeletal:       Lumbar back: She exhibits decreased range of motion. She exhibits no tenderness, no bony tenderness, no swelling, no deformity and no spasm.  Neurological: She is alert. She has normal strength. Gait normal.  Reflex Scores:      Patellar reflexes are 2+ on the right side and 2+ on the left side.      Achilles reflexes are 2+ on the right side and 2+ on the left side.  Negative straight leg raise  [facility] Primary radiology reading by Dr. Georgiana Shore: Mild curvature to spine       Assessment & Plan:  LBP - refer to PT.  In meantime can see if flexeril gives her any additional relief.

## 2012-04-03 DIAGNOSIS — IMO0002 Reserved for concepts with insufficient information to code with codable children: Secondary | ICD-10-CM | POA: Diagnosis not present

## 2012-04-03 DIAGNOSIS — M6281 Muscle weakness (generalized): Secondary | ICD-10-CM | POA: Diagnosis not present

## 2012-04-03 DIAGNOSIS — M545 Low back pain: Secondary | ICD-10-CM | POA: Diagnosis not present

## 2012-04-08 DIAGNOSIS — M545 Low back pain: Secondary | ICD-10-CM | POA: Diagnosis not present

## 2012-04-08 DIAGNOSIS — M6281 Muscle weakness (generalized): Secondary | ICD-10-CM | POA: Diagnosis not present

## 2012-04-08 DIAGNOSIS — IMO0002 Reserved for concepts with insufficient information to code with codable children: Secondary | ICD-10-CM | POA: Diagnosis not present

## 2012-04-09 DIAGNOSIS — M545 Low back pain: Secondary | ICD-10-CM | POA: Diagnosis not present

## 2012-04-09 DIAGNOSIS — M6281 Muscle weakness (generalized): Secondary | ICD-10-CM | POA: Diagnosis not present

## 2012-04-09 DIAGNOSIS — IMO0002 Reserved for concepts with insufficient information to code with codable children: Secondary | ICD-10-CM | POA: Diagnosis not present

## 2012-04-16 DIAGNOSIS — M545 Low back pain: Secondary | ICD-10-CM | POA: Diagnosis not present

## 2012-04-16 DIAGNOSIS — IMO0002 Reserved for concepts with insufficient information to code with codable children: Secondary | ICD-10-CM | POA: Diagnosis not present

## 2012-04-16 DIAGNOSIS — M6281 Muscle weakness (generalized): Secondary | ICD-10-CM | POA: Diagnosis not present

## 2012-04-18 ENCOUNTER — Encounter: Payer: Self-pay | Admitting: Advanced Practice Midwife

## 2012-04-18 ENCOUNTER — Ambulatory Visit (INDEPENDENT_AMBULATORY_CARE_PROVIDER_SITE_OTHER): Payer: Medicare Other | Admitting: Advanced Practice Midwife

## 2012-04-18 VITALS — BP 124/77 | HR 83 | Temp 97.0°F | Ht 62.5 in | Wt 171.3 lb

## 2012-04-18 DIAGNOSIS — N9489 Other specified conditions associated with female genital organs and menstrual cycle: Secondary | ICD-10-CM

## 2012-04-18 DIAGNOSIS — N760 Acute vaginitis: Secondary | ICD-10-CM | POA: Diagnosis not present

## 2012-04-18 DIAGNOSIS — M545 Low back pain: Secondary | ICD-10-CM | POA: Diagnosis not present

## 2012-04-18 DIAGNOSIS — M6281 Muscle weakness (generalized): Secondary | ICD-10-CM | POA: Diagnosis not present

## 2012-04-18 DIAGNOSIS — N941 Unspecified dyspareunia: Secondary | ICD-10-CM

## 2012-04-18 DIAGNOSIS — IMO0002 Reserved for concepts with insufficient information to code with codable children: Secondary | ICD-10-CM | POA: Diagnosis not present

## 2012-04-18 DIAGNOSIS — Z7251 High risk heterosexual behavior: Secondary | ICD-10-CM | POA: Diagnosis not present

## 2012-04-18 DIAGNOSIS — N949 Unspecified condition associated with female genital organs and menstrual cycle: Secondary | ICD-10-CM

## 2012-04-18 LAB — CBC
MCH: 29 pg (ref 26.0–34.0)
MCHC: 33.8 g/dL (ref 30.0–36.0)
Platelets: 310 10*3/uL (ref 150–400)
RBC: 4.89 MIL/uL (ref 3.87–5.11)

## 2012-04-18 LAB — WET PREP, GENITAL
Clue Cells Wet Prep HPF POC: NONE SEEN
Trich, Wet Prep: NONE SEEN

## 2012-04-18 LAB — RPR

## 2012-04-18 MED ORDER — NYSTATIN-TRIAMCINOLONE 100000-0.1 UNIT/GM-% EX CREA: TOPICAL_CREAM | Freq: Three times a day (TID) | CUTANEOUS | Status: DC

## 2012-04-18 MED ORDER — FLUCONAZOLE 150 MG PO TABS: 150.0000 mg | ORAL_TABLET | Freq: Once | ORAL | Status: AC

## 2012-04-18 NOTE — Progress Notes (Signed)
States was on depoprovera for 5 years, changed to pill about 1 1/2 years ago, but still hasn't had a period.

## 2012-04-18 NOTE — Progress Notes (Signed)
Subjective:     Patient ID: Vicki Mccoy, female   DOB: Nov 05, 1981, 31 y.o.   MRN: [id number]  HPI; Pt was referred to Gyn clinic for "bump" noticed during pelvic exam. Pt was never aware of the bump and does not know were the provider saw it. She reports vulvar irritation and worsening dyspareunia over the past few months so severe that she avoids intercourse. Last Pap March 2013, normal per pt.   Review of Systems: Negative for fever, chills, vaginal discharge, Hx of vulvar lesions.   Past Medical History  Diagnosis Date  . Diabetes mellitus   . Sickle cell trait    History reviewed. No pertinent family history.  History reviewed. No pertinent past surgical history.   History   Social History  . Marital Status: Single    Spouse Name: N/A    Number of Children: N/A  . Years of Education: N/A   Occupational History  . Not on file.   Social History Main Topics  . Smoking status: Never Smoker   . Smokeless tobacco: Never Used  . Alcohol Use: No  . Drug Use: No  . Sexually Active: Not Currently    Birth Control/ Protection: Pill   Other Topics Concern  . Not on file   Social History Narrative  . No narrative on file  No LMP recorded. Patient is not currently having periods (Reason: Oral contraceptives).     Objective:   Physical Exam  Nursing note and vitals reviewed. Constitutional: She is oriented to person, place, and time. She appears well-developed and well-nourished. No distress.  HENT:  Head: Normocephalic.  Eyes: Conjunctivae are normal. Pupils are equal, round, and reactive to light.  Cardiovascular: Normal rate.   Pulmonary/Chest: Effort normal.  Abdominal: Soft. There is no tenderness.  Genitourinary: There is no rash or lesion on the right labia. There is no rash or lesion on the left labia. Uterus is not enlarged and not tender. Cervix exhibits no motion tenderness, no discharge and no friability. Right adnexum displays no mass and no tenderness. Left  adnexum displays no mass and no tenderness. There is erythema around the vagina. No bleeding around the vagina. Vaginal discharge (thick, curd-like, odorless discharge) found.  Lymphadenopathy:       Right: No inguinal adenopathy present.       Left: No inguinal adenopathy present.  Neurological: She is alert and oriented to person, place, and time.  Skin: Skin is warm and dry.  Psychiatric: She has a normal mood and affect.      Assessment:   1. Dyspareunia, female  US Pelvis Complete, US Transvaginal Non-OB, GC/chlamydia probe amp, urine, CBC  2. Vaginitis  nystatin-triamcinolone (MYCOLOG II) cream, fluconazole (DIFLUCAN) 150 MG tablet, GC/chlamydia probe amp, urine, Wet prep, genital  3. Genital lesion, female  RPR  (No lesion seen on exam)    Plan:  F/U in 2-3 weeks for results and PRN if lesion seen. Declined HSV glycoprotein testing due to recent negative results per pt.   Dorathy Kinsman  04/18/12

## 2012-04-19 LAB — GC/CHLAMYDIA PROBE AMP, URINE: Chlamydia, Swab/Urine, PCR: NEGATIVE

## 2012-04-21 DIAGNOSIS — IMO0002 Reserved for concepts with insufficient information to code with codable children: Secondary | ICD-10-CM | POA: Diagnosis not present

## 2012-04-21 DIAGNOSIS — M6281 Muscle weakness (generalized): Secondary | ICD-10-CM | POA: Diagnosis not present

## 2012-04-21 DIAGNOSIS — M545 Low back pain: Secondary | ICD-10-CM | POA: Diagnosis not present

## 2012-04-23 ENCOUNTER — Ambulatory Visit (HOSPITAL_COMMUNITY)
Admission: RE | Admit: 2012-04-23 | Discharge: 2012-04-23 | Disposition: A | Discharge status: Home / Self Care | Payer: Medicare Other | Source: Ambulatory Visit | Attending: Advanced Practice Midwife | Admitting: Advanced Practice Midwife

## 2012-04-23 DIAGNOSIS — Q638 Other specified congenital malformations of kidney: Secondary | ICD-10-CM | POA: Insufficient documentation

## 2012-04-23 DIAGNOSIS — N941 Unspecified dyspareunia: Secondary | ICD-10-CM

## 2012-04-23 DIAGNOSIS — IMO0002 Reserved for concepts with insufficient information to code with codable children: Secondary | ICD-10-CM | POA: Diagnosis not present

## 2012-04-24 ENCOUNTER — Telehealth: Payer: Self-pay | Admitting: General Practice

## 2012-04-24 DIAGNOSIS — M545 Low back pain: Secondary | ICD-10-CM | POA: Diagnosis not present

## 2012-04-24 DIAGNOSIS — IMO0002 Reserved for concepts with insufficient information to code with codable children: Secondary | ICD-10-CM | POA: Diagnosis not present

## 2012-04-24 DIAGNOSIS — M6281 Muscle weakness (generalized): Secondary | ICD-10-CM | POA: Diagnosis not present

## 2012-04-24 NOTE — Telephone Encounter (Signed)
Patient called stating she had an ultrasound done on 6/19 and that someone informed her they would be calling with the results and she has not received a call yet. Patient states she would like to know the results of her ultrasound.

## 2012-04-29 DIAGNOSIS — M6281 Muscle weakness (generalized): Secondary | ICD-10-CM | POA: Diagnosis not present

## 2012-04-29 DIAGNOSIS — IMO0002 Reserved for concepts with insufficient information to code with codable children: Secondary | ICD-10-CM | POA: Diagnosis not present

## 2012-04-29 DIAGNOSIS — M545 Low back pain: Secondary | ICD-10-CM | POA: Diagnosis not present

## 2012-04-30 NOTE — Telephone Encounter (Signed)
Called pt and informed pt that her [place] results are normal and if she wanted to she could make a follow up appt concerning what would be the next plan of care.  Pt stated that she would not make appt at this time but would if she felt she needed to.

## 2012-05-21 DIAGNOSIS — IMO0002 Reserved for concepts with insufficient information to code with codable children: Secondary | ICD-10-CM | POA: Diagnosis not present

## 2012-05-21 DIAGNOSIS — M545 Low back pain: Secondary | ICD-10-CM | POA: Diagnosis not present

## 2012-05-21 DIAGNOSIS — M6281 Muscle weakness (generalized): Secondary | ICD-10-CM | POA: Diagnosis not present

## 2012-06-24 ENCOUNTER — Telehealth: Payer: Self-pay

## 2012-06-24 NOTE — Telephone Encounter (Signed)
I have spoken to patient and she is asking for a note she can go back to work, she has been out since June. She was told I can not give her a note excusing her for her time missed. She said this is fine, she just needs a note she may return. Please advise. Patient states she did go for PT with her back on [place] and she is feeling better now and not having spasms. She is not taking any medications for her back. Last visit here for her back was the end of may with Dr Georgiana Shore.

## 2012-06-24 NOTE — Telephone Encounter (Signed)
Please get PT notes. We cannot clear her for work without evaluation.

## 2012-06-24 NOTE — Telephone Encounter (Signed)
Patient would like a note stating that she can go back to work. Was diagnoses with arthritis in back and needs note from doctor about this.

## 2012-06-25 ENCOUNTER — Telehealth: Payer: Self-pay

## 2012-06-25 NOTE — Telephone Encounter (Signed)
Ok, have her come in for evaluation so we can clear her.

## 2012-06-25 NOTE — Telephone Encounter (Signed)
[facility]. Number [phone]. I called and requested notes the d/c summary to be sent to me for review.

## 2012-06-25 NOTE — Telephone Encounter (Signed)
I have had Alycia Rossetti review the PT d/c summary and patient may return to work, but we will not excuse her from missed days/ note place at desk for pickup

## 2012-06-25 NOTE — Telephone Encounter (Signed)
I have the d/c summary from [facility] patient met goals and was discharged, she attended 9 PT sessions.

## 2012-07-01 DIAGNOSIS — Z309 Encounter for contraceptive management, unspecified: Secondary | ICD-10-CM | POA: Diagnosis not present

## 2012-07-01 DIAGNOSIS — E119 Type 2 diabetes mellitus without complications: Secondary | ICD-10-CM | POA: Diagnosis not present

## 2012-07-01 DIAGNOSIS — N390 Urinary tract infection, site not specified: Secondary | ICD-10-CM | POA: Diagnosis not present

## 2012-07-01 DIAGNOSIS — E669 Obesity, unspecified: Secondary | ICD-10-CM | POA: Diagnosis not present

## 2012-07-15 DIAGNOSIS — E669 Obesity, unspecified: Secondary | ICD-10-CM | POA: Diagnosis not present

## 2012-07-15 DIAGNOSIS — E1159 Type 2 diabetes mellitus with other circulatory complications: Secondary | ICD-10-CM | POA: Diagnosis not present

## 2012-07-31 DIAGNOSIS — R0609 Other forms of dyspnea: Secondary | ICD-10-CM | POA: Diagnosis not present

## 2012-07-31 DIAGNOSIS — Z309 Encounter for contraceptive management, unspecified: Secondary | ICD-10-CM | POA: Diagnosis not present

## 2012-07-31 DIAGNOSIS — E669 Obesity, unspecified: Secondary | ICD-10-CM | POA: Diagnosis not present

## 2012-07-31 DIAGNOSIS — E119 Type 2 diabetes mellitus without complications: Secondary | ICD-10-CM | POA: Diagnosis not present

## 2012-07-31 DIAGNOSIS — R5383 Other fatigue: Secondary | ICD-10-CM | POA: Diagnosis not present

## 2012-07-31 DIAGNOSIS — B379 Candidiasis, unspecified: Secondary | ICD-10-CM | POA: Diagnosis not present

## 2012-07-31 DIAGNOSIS — E559 Vitamin D deficiency, unspecified: Secondary | ICD-10-CM | POA: Diagnosis not present

## 2012-07-31 DIAGNOSIS — R0989 Other specified symptoms and signs involving the circulatory and respiratory systems: Secondary | ICD-10-CM | POA: Diagnosis not present

## 2012-07-31 DIAGNOSIS — R5381 Other malaise: Secondary | ICD-10-CM | POA: Diagnosis not present

## 2012-08-21 DIAGNOSIS — G4700 Insomnia, unspecified: Secondary | ICD-10-CM | POA: Diagnosis not present

## 2012-09-09 DIAGNOSIS — M2010 Hallux valgus (acquired), unspecified foot: Secondary | ICD-10-CM | POA: Diagnosis not present

## 2012-09-09 DIAGNOSIS — IMO0002 Reserved for concepts with insufficient information to code with codable children: Secondary | ICD-10-CM | POA: Diagnosis not present

## 2012-09-09 DIAGNOSIS — M7150 Other bursitis, not elsewhere classified, unspecified site: Secondary | ICD-10-CM | POA: Diagnosis not present

## 2012-09-09 DIAGNOSIS — M779 Enthesopathy, unspecified: Secondary | ICD-10-CM | POA: Diagnosis not present

## 2012-09-09 DIAGNOSIS — G5760 Lesion of plantar nerve, unspecified lower limb: Secondary | ICD-10-CM | POA: Diagnosis not present

## 2012-09-10 DIAGNOSIS — E119 Type 2 diabetes mellitus without complications: Secondary | ICD-10-CM | POA: Diagnosis not present

## 2012-09-14 ENCOUNTER — Ambulatory Visit: Payer: Medicare Other

## 2012-09-14 ENCOUNTER — Ambulatory Visit (INDEPENDENT_AMBULATORY_CARE_PROVIDER_SITE_OTHER): Payer: Medicare Other | Admitting: Family Medicine

## 2012-09-14 VITALS — BP 124/76 | HR 58 | Temp 98.2°F | Resp 20 | Ht 62.5 in | Wt 153.0 lb

## 2012-09-14 DIAGNOSIS — N949 Unspecified condition associated with female genital organs and menstrual cycle: Secondary | ICD-10-CM | POA: Diagnosis not present

## 2012-09-14 DIAGNOSIS — R300 Dysuria: Secondary | ICD-10-CM

## 2012-09-14 DIAGNOSIS — N390 Urinary tract infection, site not specified: Secondary | ICD-10-CM

## 2012-09-14 DIAGNOSIS — N898 Other specified noninflammatory disorders of vagina: Secondary | ICD-10-CM | POA: Diagnosis not present

## 2012-09-14 DIAGNOSIS — R102 Pelvic and perineal pain: Secondary | ICD-10-CM

## 2012-09-14 LAB — POCT URINALYSIS DIPSTICK
Bilirubin, UA: NEGATIVE
Blood, UA: NEGATIVE
Glucose, UA: NEGATIVE
Ketones, UA: NEGATIVE
Nitrite, UA: NEGATIVE
Protein, UA: NEGATIVE
Spec Grav, UA: 1.01
Urobilinogen, UA: 0.2
pH, UA: 6

## 2012-09-14 LAB — POCT UA - MICROSCOPIC ONLY
Casts, Ur, LPF, POC: NEGATIVE
Mucus, UA: NEGATIVE
Yeast, UA: NEGATIVE

## 2012-09-14 LAB — POCT WET PREP WITH KOH
KOH Prep POC: NEGATIVE
RBC Wet Prep HPF POC: NEGATIVE
Trichomonas, UA: NEGATIVE
Yeast Wet Prep HPF POC: NEGATIVE

## 2012-09-14 MED ORDER — CIPROFLOXACIN HCL 250 MG PO TABS: 250.0000 mg | ORAL_TABLET | Freq: Two times a day (BID) | ORAL | Status: DC

## 2012-09-14 NOTE — Progress Notes (Signed)
[facility]:  Office Visit  Chief Complaint:  Chief Complaint  Patient presents with  . Back Pain    1 day   . Urinary Frequency    HPI: Vicki Mccoy is a 31 y.o. female who complains of  1 day h/o dysuria and lower abd pain . + chills, no fevers. deneis nausea, comiting. Had clear vaginal dc, has had sex on Saturday no condoms, had pain with sex, lower abd pain, back pain.  no rash, no itching. Has not tried anything for it.   Past Medical History  Diagnosis Date  . Diabetes mellitus   . Sickle cell trait   . Arthritis    History reviewed. No pertinent past surgical history. History   Social History  . Marital Status: Single    Spouse Name: N/A    Number of Children: N/A  . Years of Education: N/A   Social History Main Topics  . Smoking status: Never Smoker   . Smokeless tobacco: Never Used  . Alcohol Use: No  . Drug Use: No  . Sexually Active: Yes    Birth Control/ Protection: Pill   Other Topics Concern  . None   Social History Narrative  . None   Family History  Problem Relation Age of Onset  . Sickle cell anemia Mother    No Known Allergies Prior to Admission medications   Medication Sig Start Date End Date Taking? Authorizing Provider  cyclobenzaprine (FLEXERIL) 5 MG tablet Take 5 mg by mouth 3 (three) times daily as needed.   Yes Historical Provider, MD  metFORMIN (GLUCOPHAGE) 500 MG tablet Take 500 mg by mouth daily with breakfast.   Yes Historical Provider, MD  naproxen sodium (ANAPROX) 550 MG tablet Take 550 mg by mouth 2 (two) times daily with a meal.   Yes Historical Provider, MD  Norethindrone-Ethinyl Estradiol-Fe Biphas (LO LOESTRIN FE) 1 MG-10 MCG / 10 MCG tablet Take 1 tablet by mouth daily.   Yes Historical Provider, MD  nystatin-triamcinolone (MYCOLOG II) cream Apply topically 3 (three) times daily. 04/18/12 04/18/13  Dorathy Kinsman, CNM     ROS: The patient denies fevers, chills, night sweats, unintentional weight loss,  chest pain, palpitations, wheezing, dyspnea on exertion, nausea, vomiting, hematuria, melena, numbness, weakness, or tingling.  All other systems have been reviewed and were otherwise negative with the exception of those mentioned in the HPI and as above.    PHYSICAL EXAM: Filed Vitals:   09/14/12 0913  BP: 124/76  Pulse: 58  Temp: 98.2 F (36.8 C)  Resp: 20   Filed Vitals:   09/14/12 0913  Height: 5' 2.5" (1.588 m)  Weight: 153 lb (69.4 kg)   Body mass index is 27.54 kg/(m^2).  General: Alert, no acute distress HEENT:  Normocephalic, atraumatic, oropharynx patent.  Cardiovascular:  Regular rate and rhythm, no rubs murmurs or gallops.  No Carotid bruits, radial pulse intact. No pedal edema.  Respiratory: Clear to auscultation bilaterally.  No wheezes, rales, or rhonchi.  No cyanosis, no use of accessory musculature GI: No organomegaly, abdomen is soft and non-tender, positive bowel sounds.  No masses. Skin: No rashes. Neurologic: Facial musculature symmetric. Psychiatric: Patient is appropriate throughout our interaction. Lymphatic: No cervical lymphadenopathy Musculoskeletal: Gait intact. GU-no masses, lesions, + white , clear dc, + nabothian cyst, no CMT   LABS: Results for orders placed in visit on 09/14/12  POCT UA - MICROSCOPIC ONLY      Component Value Range   WBC, Ur,  HPF, POC 4-7     RBC, urine, microscopic 3-4     Bacteria, U Microscopic trace     Mucus, UA neg     Epithelial cells, urine per micros 0-3     Crystals, Ur, HPF, POC 1+amorph     Casts, Ur, LPF, POC neg     Yeast, UA neg    POCT URINALYSIS DIPSTICK      Component Value Range   Color, UA yellow     Clarity, UA hazy     Glucose, UA neg     Bilirubin, UA neg     Ketones, UA neg     Spec Grav, UA 1.010     Blood, UA neg     pH, UA 6.0     Protein, UA neg     Urobilinogen, UA 0.2     Nitrite, UA neg     Leukocytes, UA small (1+)    POCT WET PREP WITH KOH      Component Value Range    Trichomonas, UA Negative     Clue Cells Wet Prep HPF POC 0-1     Epithelial Wet Prep HPF POC 2-5     Yeast Wet Prep HPF POC neg     Bacteria Wet Prep HPF POC trace/cocci     RBC Wet Prep HPF POC neg     WBC Wet Prep HPF POC 0-2     KOH Prep POC Negative       EKG/XRAY:   Primary read interpreted by Dr. Conley Rolls at [facility].   ASSESSMENT/PLAN: Encounter Diagnoses  Name Primary?  . Dysuria Yes  . UTI (lower urinary tract infection)   . Vaginal discharge   . Pelvic pain in female    GC pending Urine cx pending Wet prep pending Will give cipro 250 mg BID x 3 days    LE, THAO PHUONG, DO 09/15/2012 11:05 AM

## 2012-09-15 LAB — GC/CHLAMYDIA PROBE AMP, GENITAL
Chlamydia, DNA Probe: NEGATIVE
GC Probe Amp, Genital: NEGATIVE

## 2012-09-15 LAB — URINE CULTURE

## 2012-09-16 ENCOUNTER — Telehealth: Payer: Self-pay | Admitting: Family Medicine

## 2012-09-16 NOTE — Telephone Encounter (Signed)
Spoke with patient about labs. She is slightly better. Asked her to wait and see if no improvement with  Pain then see Ob/gyn. I can order her [place] but it would be good to get an eval by OB since she hasn't had sxs for very long if this persists.

## 2012-09-18 DIAGNOSIS — M2040 Other hammer toe(s) (acquired), unspecified foot: Secondary | ICD-10-CM | POA: Diagnosis not present

## 2012-10-21 DIAGNOSIS — R946 Abnormal results of thyroid function studies: Secondary | ICD-10-CM | POA: Diagnosis not present

## 2012-10-21 DIAGNOSIS — E669 Obesity, unspecified: Secondary | ICD-10-CM | POA: Diagnosis not present

## 2012-10-21 DIAGNOSIS — E119 Type 2 diabetes mellitus without complications: Secondary | ICD-10-CM | POA: Diagnosis not present

## 2012-10-21 DIAGNOSIS — E559 Vitamin D deficiency, unspecified: Secondary | ICD-10-CM | POA: Diagnosis not present

## 2012-10-21 DIAGNOSIS — Z23 Encounter for immunization: Secondary | ICD-10-CM | POA: Diagnosis not present

## 2012-10-21 DIAGNOSIS — Z309 Encounter for contraceptive management, unspecified: Secondary | ICD-10-CM | POA: Diagnosis not present

## 2012-10-24 ENCOUNTER — Emergency Department (HOSPITAL_COMMUNITY)
Admission: EM | Admit: 2012-10-24 | Discharge: 2012-10-24 | Disposition: A | Discharge status: Home / Self Care | Payer: Medicare Other | Attending: Emergency Medicine | Admitting: Emergency Medicine

## 2012-10-24 DIAGNOSIS — Z862 Personal history of diseases of the blood and blood-forming organs and certain disorders involving the immune mechanism: Secondary | ICD-10-CM | POA: Insufficient documentation

## 2012-10-24 DIAGNOSIS — J019 Acute sinusitis, unspecified: Secondary | ICD-10-CM | POA: Diagnosis not present

## 2012-10-24 DIAGNOSIS — Z79899 Other long term (current) drug therapy: Secondary | ICD-10-CM | POA: Diagnosis not present

## 2012-10-24 DIAGNOSIS — J3489 Other specified disorders of nose and nasal sinuses: Secondary | ICD-10-CM | POA: Diagnosis not present

## 2012-10-24 DIAGNOSIS — R059 Cough, unspecified: Secondary | ICD-10-CM | POA: Diagnosis not present

## 2012-10-24 DIAGNOSIS — J329 Chronic sinusitis, unspecified: Secondary | ICD-10-CM | POA: Diagnosis not present

## 2012-10-24 DIAGNOSIS — R6889 Other general symptoms and signs: Secondary | ICD-10-CM | POA: Insufficient documentation

## 2012-10-24 DIAGNOSIS — R51 Headache: Secondary | ICD-10-CM | POA: Diagnosis not present

## 2012-10-24 DIAGNOSIS — R05 Cough: Secondary | ICD-10-CM | POA: Insufficient documentation

## 2012-10-24 DIAGNOSIS — E119 Type 2 diabetes mellitus without complications: Secondary | ICD-10-CM | POA: Diagnosis not present

## 2012-10-24 DIAGNOSIS — J069 Acute upper respiratory infection, unspecified: Secondary | ICD-10-CM | POA: Insufficient documentation

## 2012-10-24 DIAGNOSIS — R079 Chest pain, unspecified: Secondary | ICD-10-CM | POA: Diagnosis not present

## 2012-10-24 DIAGNOSIS — Z8739 Personal history of other diseases of the musculoskeletal system and connective tissue: Secondary | ICD-10-CM | POA: Insufficient documentation

## 2012-10-24 MED ORDER — OXYMETAZOLINE HCL 0.05 % NA SOLN: 2.0000 | Freq: Two times a day (BID) | NASAL | Status: AC | PRN

## 2012-10-24 MED ORDER — SODIUM CHLORIDE 0.65 % NA SOLN: 1.0000 | NASAL | Status: DC | PRN

## 2012-10-24 NOTE — ED Provider Notes (Signed)
History   This chart was scribed for Derwood Kaplan, MD by Charolett Bumpers, ED Scribe. The patient was seen in room TR06C/TR06C. Patient's care was started at 1610.    CSN: [id number]  Arrival date & time 10/24/12  1453   First MD Initiated Contact with Patient 10/24/12 1610      Chief Complaint  Patient presents with  . URI    The history is provided by the patient. No language interpreter was used.  Vicki Mccoy is a 31 y.o. female who presents to the Emergency Department complaining of constant, moderate nasal congestion with associated rhinorrhea, dry cough, sneezing, chills and a headache for the past 2 days. She states she had mild short-lived chest pain last night. She denies any sore throat, SOB, fevers, vomiting, diarrhea. She has a daughter ill with influenza and has been hospitalized. She states that she has been taking Tylenol so she is unsure of any body aches.   Past Medical History  Diagnosis Date  . Diabetes mellitus   . Sickle cell trait   . Arthritis     No past surgical history on file.  Family History  Problem Relation Age of Onset  . Sickle cell anemia Mother     History  Substance Use Topics  . Smoking status: Never Smoker   . Smokeless tobacco: Never Used  . Alcohol Use: No    OB History    Grav Para Term Preterm Abortions TAB SAB Ect Mult Living   2 2 1 1  0  0   1      Review of Systems  Constitutional: Positive for chills. Negative for fever.  HENT: Positive for congestion, rhinorrhea and sneezing. Negative for sore throat.   Respiratory: Positive for cough. Negative for shortness of breath.   Cardiovascular: Positive for chest pain.  Gastrointestinal: Negative for vomiting and diarrhea.  Neurological: Positive for headaches.  All other systems reviewed and are negative.    Allergies  Review of patient's allergies indicates no known allergies.  Home Medications   Current Outpatient Rx  Name  Route  Sig  Dispense  Refill   . IBUPROFEN 200 MG PO TABS   Oral   Take 400 mg by mouth every 6 (six) hours as needed. For pain         . METFORMIN HCL 500 MG PO TABS   Oral   Take 500 mg by mouth daily with breakfast.         . NORETHIN-ETH ESTRAD-FE BIPHAS 1 MG-10 MCG / 10 MCG PO TABS   Oral   Take 1 tablet by mouth daily.           BP 122/58  Pulse 87  Temp 98.4 F (36.9 C) (Oral)  Resp 15  SpO2 99%  Physical Exam  Nursing note and vitals reviewed. Constitutional: She is oriented to person, place, and time. She appears well-developed and well-nourished. No distress.  HENT:  Head: Normocephalic and atraumatic.       Mild edema bilateral nares. No posterior oropharyngeal erythema, exudates or tonsillar enlargement.   Eyes: EOM are normal.  Neck: Neck supple. No tracheal deviation present.       Mild cervical anterior lymphadenopathy.   Cardiovascular: Normal rate, regular rhythm and normal heart sounds.   No murmur heard. Pulmonary/Chest: Effort normal and breath sounds normal. No respiratory distress.       Lungs clear to ausculation.   Musculoskeletal: Normal range of motion.  Lymphadenopathy:  She has cervical adenopathy.  Neurological: She is alert and oriented to person, place, and time.  Skin: Skin is warm and dry.  Psychiatric: She has a normal mood and affect. Her behavior is normal.    ED Course  Procedures (including critical care time)  DIAGNOSTIC STUDIES: Oxygen Saturation is 99% on room air, normal by my interpretation.    COORDINATION OF CARE:  16:35-Discussed planned course of treatment with the patient, who is agreeable at this time.    Labs Reviewed - No data to display No results found.   No diagnosis found.    MDM  I personally performed the services described in this documentation, which was scribed in my presence. The recorded information has been reviewed and is accurate.  Pt comes in with cc of sinus congestion, rhinorrhea. No fevers, no sore throat.  She has a dry cough. I think she is having a mild viral syndrome with URI like sx. No allergy hx, and she is already taking some thing for the allergies. Will d.c with meds for symptomatic relief.     Derwood Kaplan, MD 10/24/12 [phone]

## 2012-10-24 NOTE — ED Notes (Addendum)
prsents with 3 days of upper airway congestion, rhinnorhea, cough and sneezing. Denies fevers. Pt is alert and oriented, bilateral breath sounds clear.  Received flu shot Tuesday. Daughter is here hospitalized with pneumonia and flu.

## 2012-12-10 DIAGNOSIS — D2370 Other benign neoplasm of skin of unspecified lower limb, including hip: Secondary | ICD-10-CM | POA: Diagnosis not present

## 2012-12-23 DIAGNOSIS — M549 Dorsalgia, unspecified: Secondary | ICD-10-CM | POA: Diagnosis not present

## 2012-12-23 DIAGNOSIS — E669 Obesity, unspecified: Secondary | ICD-10-CM | POA: Diagnosis not present

## 2012-12-23 DIAGNOSIS — E559 Vitamin D deficiency, unspecified: Secondary | ICD-10-CM | POA: Diagnosis not present

## 2012-12-23 DIAGNOSIS — Z309 Encounter for contraceptive management, unspecified: Secondary | ICD-10-CM | POA: Diagnosis not present

## 2012-12-23 DIAGNOSIS — R946 Abnormal results of thyroid function studies: Secondary | ICD-10-CM | POA: Diagnosis not present

## 2012-12-23 DIAGNOSIS — E119 Type 2 diabetes mellitus without complications: Secondary | ICD-10-CM | POA: Diagnosis not present

## 2013-01-03 ENCOUNTER — Emergency Department (HOSPITAL_COMMUNITY)
Admission: EM | Admit: 2013-01-03 | Discharge: 2013-01-03 | Disposition: A | Discharge status: Home / Self Care | Payer: Medicare Other | Attending: Emergency Medicine | Admitting: Emergency Medicine

## 2013-01-03 ENCOUNTER — Ambulatory Visit (INDEPENDENT_AMBULATORY_CARE_PROVIDER_SITE_OTHER): Payer: Medicare Other | Admitting: Family Medicine

## 2013-01-03 ENCOUNTER — Encounter (HOSPITAL_COMMUNITY): Payer: Self-pay | Admitting: Adult Health

## 2013-01-03 VITALS — BP 129/77 | HR 76 | Temp 97.9°F | Resp 18 | Ht 62.0 in | Wt 156.0 lb

## 2013-01-03 DIAGNOSIS — H5702 Anisocoria: Secondary | ICD-10-CM

## 2013-01-03 DIAGNOSIS — Z79899 Other long term (current) drug therapy: Secondary | ICD-10-CM | POA: Insufficient documentation

## 2013-01-03 DIAGNOSIS — E119 Type 2 diabetes mellitus without complications: Secondary | ICD-10-CM | POA: Insufficient documentation

## 2013-01-03 DIAGNOSIS — H538 Other visual disturbances: Secondary | ICD-10-CM | POA: Insufficient documentation

## 2013-01-03 DIAGNOSIS — H5710 Ocular pain, unspecified eye: Secondary | ICD-10-CM | POA: Diagnosis not present

## 2013-01-03 DIAGNOSIS — H5704 Mydriasis: Secondary | ICD-10-CM | POA: Insufficient documentation

## 2013-01-03 DIAGNOSIS — H109 Unspecified conjunctivitis: Secondary | ICD-10-CM | POA: Insufficient documentation

## 2013-01-03 DIAGNOSIS — H5711 Ocular pain, right eye: Secondary | ICD-10-CM

## 2013-01-03 DIAGNOSIS — Z862 Personal history of diseases of the blood and blood-forming organs and certain disorders involving the immune mechanism: Secondary | ICD-10-CM | POA: Insufficient documentation

## 2013-01-03 DIAGNOSIS — Z8739 Personal history of other diseases of the musculoskeletal system and connective tissue: Secondary | ICD-10-CM | POA: Diagnosis not present

## 2013-01-03 DIAGNOSIS — H539 Unspecified visual disturbance: Secondary | ICD-10-CM | POA: Diagnosis not present

## 2013-01-03 DIAGNOSIS — H1030 Unspecified acute conjunctivitis, unspecified eye: Secondary | ICD-10-CM | POA: Diagnosis not present

## 2013-01-03 DIAGNOSIS — H5789 Other specified disorders of eye and adnexa: Secondary | ICD-10-CM | POA: Diagnosis not present

## 2013-01-03 MED ORDER — CIPROFLOXACIN HCL 0.3 % OP SOLN
2.0000 [drp] | Freq: Once | OPHTHALMIC | Status: AC
  Administered 2013-01-03: 2 [drp] via OPHTHALMIC
  Filled 2013-01-03: qty 2.5

## 2013-01-03 MED ORDER — PROPARACAINE HCL 0.5 % OP SOLN
1.0000 [drp] | Freq: Once | OPHTHALMIC | Status: AC
  Administered 2013-01-03: 1 [drp] via OPHTHALMIC
  Filled 2013-01-03: qty 15

## 2013-01-03 MED ORDER — FLUORESCEIN SODIUM 1 MG OP STRP
1.0000 | ORAL_STRIP | Freq: Once | OPHTHALMIC | Status: AC
  Administered 2013-01-03: 1 via OPHTHALMIC
  Filled 2013-01-03: qty 1

## 2013-01-03 NOTE — Progress Notes (Addendum)
[facility]:  Office Visit  Chief Complaint:  Chief Complaint  Patient presents with  . Eye Pain    right eye; since today    HPI: Vicki Mccoy is a 32 y.o. female who complains of  Right eye pain and redness with purulent dc which started this afternoon. She woke up with pain in her upper eyelid and swelling with redness today at 1 pm, NKI. She denies scratching it, she denies having any trauma, she has never had any surgeries or any eye work done. She wears glasses, her vision is decreased. Denies eyelid drooping, double vision, photophobia. Denies glaucoma or any eye disorders. She has not used any new meds or any eye drops. Denies URI sxs or contact with someone with similar eye problems. Denies STDs .  Diabetes well controlled. No rashes.   Past Medical History  Diagnosis Date  . Diabetes mellitus   . Sickle cell trait   . Arthritis    History reviewed. No pertinent past surgical history. History   Social History  . Marital Status: Single    Spouse Name: N/A    Number of Children: N/A  . Years of Education: N/A   Social History Main Topics  . Smoking status: Never Smoker   . Smokeless tobacco: Never Used  . Alcohol Use: No  . Drug Use: No  . Sexually Active: Yes    Birth Control/ Protection: Pill   Other Topics Concern  . None   Social History Narrative  . None   Family History  Problem Relation Age of Onset  . Sickle cell anemia Mother   . Cerebral palsy Daughter    No Known Allergies Prior to Admission medications   Medication Sig Start Date End Date Taking? Authorizing Provider  ibuprofen (ADVIL,MOTRIN) 200 MG tablet Take 400 mg by mouth every 6 (six) hours as needed. For pain   Yes Historical Provider, MD  metFORMIN (GLUCOPHAGE) 500 MG tablet Take 500 mg by mouth daily with breakfast.   Yes Historical Provider, MD  Norethindrone-Ethinyl Estradiol-Fe Biphas (LO LOESTRIN FE) 1 MG-10 MCG / 10 MCG tablet Take 1 tablet by mouth daily.    Yes Historical Provider, MD  sodium chloride (OCEAN) 0.65 % nasal spray Place 1 spray into the nose as needed for congestion. 10/24/12   Derwood Kaplan, MD     ROS: The patient denies fevers, chills, night sweats, unintentional weight loss, chest pain, palpitations, wheezing, dyspnea on exertion, nausea, vomiting, abdominal pain, dysuria, hematuria, melena, numbness, weakness, or tingling.   All other systems have been reviewed and were otherwise negative with the exception of those mentioned in the HPI and as above.    PHYSICAL EXAM: Filed Vitals:   01/03/13 1809  BP: 129/77  Pulse: 76  Temp: 97.9 F (36.6 C)  Resp: 18   Filed Vitals:   01/03/13 1809  Height: 5\' 2"  (1.575 m)  Weight: 156 lb (70.761 kg)   Body mass index is 28.53 kg/(m^2).  General: Alert, no acute distress HEENT:  Normocephalic, atraumatic, oropharynx patent. EOMI nl ,+ right  Pupil does not constrict, remains dilated at dim and fulllight on fundoscopic exam, pupil response to near stimuli none/minimal, + erythema, swelling of upper eyelid, + purulent discharge. No fluoroscein uptake on exam .  Cardiovascular:  Regular rate and rhythm, no rubs murmurs or gallops.  No Carotid bruits, radial pulse intact. No pedal edema.  Respiratory: Clear to auscultation bilaterally.  No wheezes, rales, or rhonchi.  No  cyanosis, no use of accessory musculature GI: No organomegaly, abdomen is soft and non-tender, positive bowel sounds.  No masses. Skin: No rashes. Neurologic: Facial musculature symmetric. Psychiatric: Patient is appropriate throughout our interaction. Lymphatic: No cervical lymphadenopathy Musculoskeletal: Gait intact.   LABS:    EKG/XRAY:   Primary read interpreted by Dr. Conley Rolls at [facility]   ASSESSMENT/PLAN: Encounter Diagnoses  Name Primary?  . Anisocoria Yes  . Eye pain, right    Acute  Anisocoria Right Pupil Sent to [facility] ER for further evaluation Charge nurse was contacted, patient advised to  go straight to ER F/u prn     LE, THAO PHUONG, DO 01/03/2013 7:42 PM

## 2013-01-03 NOTE — ED Notes (Signed)
Woke up this morning with right eye redness and swelling and pain. Eye is watery, deines injury.

## 2013-01-03 NOTE — ED Provider Notes (Signed)
History    This chart was scribed for non-physician practitioner working with Juliet Rude. Rubin Payor, MD by Frederik Pear, ED Scribe. This patient was seen in room TR04C/TR04C and the patient's care was started at 2057.   CSN: [id number]  Arrival date & time 01/03/13  2029   First MD Initiated Contact with Patient 01/03/13 2057      Chief Complaint  Patient presents with  . Eye Pain    (Consider location/radiation/quality/duration/timing/severity/associated sxs/prior treatment) The history is provided by the patient. No language interpreter was used.    Vicki Mccoy is a 32 y.o. female who presents to the Emergency Department complaining of constant, sudden onset, gradually worsening right eye pain with redness, blurred vision, and swelling that is aggravated by moving her eye and began at 13:00 when she awoke from a nap. She reports that she was sent to the ED by UC, who did not put any eyedrops in her eyes, because of the dilation of her right pupil. She states that her eye was at baseline when she awoke this morning. She denies any double vision, sneezing, coughing, rhinorrhea, or trauma to the area. She denies any sick contacts with similar contacts. She denies any h/o of gonorrhea or chlamydia.   Past Medical History  Diagnosis Date  . Diabetes mellitus   . Sickle cell trait   . Arthritis     History reviewed. No pertinent past surgical history.  Family History  Problem Relation Age of Onset  . Sickle cell anemia Mother   . Cerebral palsy Daughter     History  Substance Use Topics  . Smoking status: Never Smoker   . Smokeless tobacco: Never Used  . Alcohol Use: No    OB History   Grav Para Term Preterm Abortions TAB SAB Ect Mult Living   2 2 1 1  0  0   1      Review of Systems  HENT: Negative for rhinorrhea and sneezing.   Eyes: Positive for visual disturbance. Negative for photophobia.  Respiratory: Negative for cough.   All other systems reviewed and are  negative.    Allergies  Review of patient's allergies indicates no known allergies.  Home Medications   Current Outpatient Rx  Name  Route  Sig  Dispense  Refill  . ibuprofen (ADVIL,MOTRIN) 200 MG tablet   Oral   Take 400 mg by mouth every 6 (six) hours as needed. For pain         . metFORMIN (GLUCOPHAGE) 500 MG tablet   Oral   Take 500 mg by mouth daily with breakfast.         . Norethindrone-Ethinyl Estradiol-Fe Biphas (LO LOESTRIN FE) 1 MG-10 MCG / 10 MCG tablet   Oral   Take 1 tablet by mouth daily.         . sodium chloride (OCEAN) 0.65 % nasal spray   Nasal   Place 1 spray into the nose as needed for congestion.   15 mL   12     BP 116/71  Pulse 69  Temp(Src) 98.6 F (37 C)  Resp 16  SpO2 99%  LMP 12/27/2012  Physical Exam  Nursing note and vitals reviewed. Constitutional: She is oriented to person, place, and time. She appears well-developed and well-nourished. No distress.  HENT:  Head: Normocephalic and atraumatic.  Eyes: EOM are normal. No foreign bodies found. Right eye exhibits discharge. Right eye exhibits no chemosis and no hordeolum. No foreign body present in the  right eye. Left eye exhibits no chemosis, no discharge and no hordeolum. No foreign body present in the left eye. Right conjunctiva is injected. Right conjunctiva has no hemorrhage. Right eye exhibits normal extraocular motion and no nystagmus. Left eye exhibits normal extraocular motion and no nystagmus. Right pupil is not reactive. Right pupil is round. Left pupil is round and reactive. Pupils are unequal (R eye: pupil 5mm, non reactive.  L eye: normal exam).  Slit lamp exam:      The right eye shows no corneal abrasion and no corneal ulcer.       The left eye shows no corneal abrasion and no corneal ulcer.  Copious amounts of pustular exudates. Right pupil is dilated. Normal vision through the 6 fields of gaze. Complain of double vision in R eye when looking up. Extraocular movement is  intact. Intraocular pressure is 11. No photophobia. No chemosis. No ulcerations. Pupils are 2 mm on the left eye and 5 mm on the right eye. The right upper eyelid is edematous.    Neck: Normal range of motion. Neck supple. No tracheal deviation present.  Cardiovascular: Normal rate.   Pulmonary/Chest: Effort normal. No respiratory distress.  Abdominal: Soft. She exhibits no distension.  Musculoskeletal: Normal range of motion. She exhibits no edema.  Neurological: She is alert and oriented to person, place, and time.  Skin: Skin is warm and dry.  Psychiatric: She has a normal mood and affect. Her behavior is normal.    ED Course  Procedures (including critical care time)  DIAGNOSTIC STUDIES: Oxygen Saturation is 100% on room air, normal by my interpretation.    COORDINATION OF CARE:  22:04- Discussed planned course of treatment with the patient, who is agreeable at this time.   Visual Acuity MJ Visual Acuity - Bilateral Distance: 20/30 ; R Distance: 20/50 ; L Distance: 20/25  11:53 PM Pt has evidence of conjunctivitis of R eye.  cipro solution given here in ER.  Pt also has dilated R pupil on exam. Non reactive to light.  Normal IOP of 11.  No c/o pressure behind eye. No facial droop.  Low suspicion of cranial 3rd palsy. Doubt Horner's syndrome, or stroke.  Pt does have hx of sickle cell traits and diabetes, but never had any eye complaints in the past. No medication concerning as potential cause of eye dilation.  No focal neuro deficits.  I have consulted with my attending and opthalmologist, Dr. Randon Goldsmith, who recommend treating pt as conjunctivitis, and he will see her in office on Monday.  All questions were answer to pt's satisfaction.  Return precaution discussed.   Labs Reviewed - No data to display No results found.   1. Conjunctivitis, right eye   2. Pupil dilated     BP 116/71  Pulse 69  Temp(Src) 98.6 F (37 C)  Resp 16  SpO2 99%  LMP 12/27/2012   MDM    I  personally performed the services described in this documentation, which was scribed in my presence. The recorded information has been reviewed and is accurate.         Fayrene Helper, PA-C 01/03/13 2357

## 2013-01-03 NOTE — ED Notes (Signed)
Pt dc to home. Pt states understanding to dc instructions. Pt ambulatory to exit without difficulty. 

## 2013-01-04 NOTE — ED Provider Notes (Signed)
Medical screening examination/treatment/procedure(s) were conducted as a shared visit with non-physician practitioner(s) and myself.  I personally evaluated the patient during the encounter. Patient with right eye redness with purulent drainage. Also dilated pupil. After discussion with ophthalmology she will be followed in the office.  Juliet Rude. Rubin Payor, MD 01/04/13 1610

## 2013-01-05 DIAGNOSIS — H5702 Anisocoria: Secondary | ICD-10-CM | POA: Diagnosis not present

## 2013-01-05 DIAGNOSIS — H1030 Unspecified acute conjunctivitis, unspecified eye: Secondary | ICD-10-CM | POA: Diagnosis not present

## 2013-01-23 DIAGNOSIS — Z309 Encounter for contraceptive management, unspecified: Secondary | ICD-10-CM | POA: Diagnosis not present

## 2013-01-23 DIAGNOSIS — E119 Type 2 diabetes mellitus without complications: Secondary | ICD-10-CM | POA: Diagnosis not present

## 2013-01-23 DIAGNOSIS — R946 Abnormal results of thyroid function studies: Secondary | ICD-10-CM | POA: Diagnosis not present

## 2013-01-23 DIAGNOSIS — E559 Vitamin D deficiency, unspecified: Secondary | ICD-10-CM | POA: Diagnosis not present

## 2013-01-23 DIAGNOSIS — M549 Dorsalgia, unspecified: Secondary | ICD-10-CM | POA: Diagnosis not present

## 2013-01-23 DIAGNOSIS — E669 Obesity, unspecified: Secondary | ICD-10-CM | POA: Diagnosis not present

## 2013-03-03 DIAGNOSIS — E1159 Type 2 diabetes mellitus with other circulatory complications: Secondary | ICD-10-CM | POA: Diagnosis not present

## 2013-03-03 DIAGNOSIS — E119 Type 2 diabetes mellitus without complications: Secondary | ICD-10-CM | POA: Diagnosis not present

## 2013-03-03 DIAGNOSIS — E785 Hyperlipidemia, unspecified: Secondary | ICD-10-CM | POA: Diagnosis not present

## 2013-03-03 DIAGNOSIS — M549 Dorsalgia, unspecified: Secondary | ICD-10-CM | POA: Diagnosis not present

## 2013-03-03 DIAGNOSIS — E669 Obesity, unspecified: Secondary | ICD-10-CM | POA: Diagnosis not present

## 2013-03-03 DIAGNOSIS — Z309 Encounter for contraceptive management, unspecified: Secondary | ICD-10-CM | POA: Diagnosis not present

## 2013-03-03 DIAGNOSIS — R946 Abnormal results of thyroid function studies: Secondary | ICD-10-CM | POA: Diagnosis not present

## 2013-03-03 DIAGNOSIS — E559 Vitamin D deficiency, unspecified: Secondary | ICD-10-CM | POA: Diagnosis not present

## 2013-03-10 DIAGNOSIS — N898 Other specified noninflammatory disorders of vagina: Secondary | ICD-10-CM | POA: Diagnosis not present

## 2013-03-10 DIAGNOSIS — N72 Inflammatory disease of cervix uteri: Secondary | ICD-10-CM | POA: Diagnosis not present

## 2013-03-10 DIAGNOSIS — Z3200 Encounter for pregnancy test, result unknown: Secondary | ICD-10-CM | POA: Diagnosis not present

## 2013-03-10 DIAGNOSIS — Z124 Encounter for screening for malignant neoplasm of cervix: Secondary | ICD-10-CM | POA: Diagnosis not present

## 2013-04-09 ENCOUNTER — Encounter: Payer: Self-pay | Admitting: Obstetrics & Gynecology

## 2013-04-09 ENCOUNTER — Ambulatory Visit (INDEPENDENT_AMBULATORY_CARE_PROVIDER_SITE_OTHER): Payer: Medicaid Other | Admitting: Obstetrics & Gynecology

## 2013-04-09 VITALS — BP 115/73 | HR 74 | Temp 97.7°F | Ht 64.0 in | Wt 163.2 lb

## 2013-04-09 DIAGNOSIS — Z309 Encounter for contraceptive management, unspecified: Secondary | ICD-10-CM | POA: Diagnosis not present

## 2013-04-09 DIAGNOSIS — Z3202 Encounter for pregnancy test, result negative: Secondary | ICD-10-CM

## 2013-04-09 MED ORDER — NORETHINDRONE-ETH ESTRADIOL 0.4-35 MG-MCG PO TABS: 1.0000 | ORAL_TABLET | Freq: Every day | ORAL | Status: DC

## 2013-04-09 NOTE — Patient Instructions (Addendum)

## 2013-04-09 NOTE — Progress Notes (Signed)
.   Subjective:     Vicki Mccoy is a 32 y.o. female here for a birth control consult.   Current complaints: She has been taking Tri-Nessa for the last 3 or 4 months and is now having a lot of hot and cold spells.   She would like to try a different pill.  Personal health questionnaire reviewed: no.   Gynecologic History Patient's last menstrual period was 03/16/2013. Contraception: OCP (estrogen/progesterone) Last Pap:5.2014 . Results were: normal Last mammogram: N/A  Obstetric History OB History   Grav Para Term Preterm Abortions TAB SAB Ect Mult Living   2 2 1 1  0  0   1     # Outc Date GA Lbr Len/2nd Wgt Sex Del Anes PTL Lv   1 TRM 2007     SVD      2 PRE 2008     SVD      Comments: 23 weeks       The following portions of the patient's history were reviewed and updated as appropriate: allergies, current medications, past family history, past medical history, past social history, past surgical history and problem list.  Review of Systems Pertinent items are noted in HPI.    Objective:     No exam today     Assessment:    ?Symptoms related to COCP  Plan:   Trial of a different COCP

## 2013-04-17 ENCOUNTER — Encounter: Payer: Self-pay | Admitting: Obstetrics

## 2013-06-02 DIAGNOSIS — E559 Vitamin D deficiency, unspecified: Secondary | ICD-10-CM | POA: Diagnosis not present

## 2013-06-02 DIAGNOSIS — E119 Type 2 diabetes mellitus without complications: Secondary | ICD-10-CM | POA: Diagnosis not present

## 2013-06-02 DIAGNOSIS — N390 Urinary tract infection, site not specified: Secondary | ICD-10-CM | POA: Diagnosis not present

## 2013-06-02 DIAGNOSIS — E669 Obesity, unspecified: Secondary | ICD-10-CM | POA: Diagnosis not present

## 2013-06-02 DIAGNOSIS — R946 Abnormal results of thyroid function studies: Secondary | ICD-10-CM | POA: Diagnosis not present

## 2013-06-02 DIAGNOSIS — M549 Dorsalgia, unspecified: Secondary | ICD-10-CM | POA: Diagnosis not present

## 2013-06-02 DIAGNOSIS — Z309 Encounter for contraceptive management, unspecified: Secondary | ICD-10-CM | POA: Diagnosis not present

## 2013-06-03 DIAGNOSIS — M549 Dorsalgia, unspecified: Secondary | ICD-10-CM | POA: Diagnosis not present

## 2013-06-11 DIAGNOSIS — J309 Allergic rhinitis, unspecified: Secondary | ICD-10-CM | POA: Diagnosis not present

## 2013-06-16 DIAGNOSIS — J301 Allergic rhinitis due to pollen: Secondary | ICD-10-CM | POA: Diagnosis not present

## 2013-07-03 ENCOUNTER — Ambulatory Visit (INDEPENDENT_AMBULATORY_CARE_PROVIDER_SITE_OTHER): Payer: Medicare Other | Admitting: Internal Medicine

## 2013-07-03 ENCOUNTER — Ambulatory Visit: Payer: Medicare Other

## 2013-07-03 VITALS — BP 110/70 | HR 89 | Temp 98.6°F | Resp 16 | Ht 62.5 in | Wt 164.6 lb

## 2013-07-03 DIAGNOSIS — R102 Pelvic and perineal pain: Secondary | ICD-10-CM

## 2013-07-03 DIAGNOSIS — N898 Other specified noninflammatory disorders of vagina: Secondary | ICD-10-CM | POA: Diagnosis not present

## 2013-07-03 DIAGNOSIS — N949 Unspecified condition associated with female genital organs and menstrual cycle: Secondary | ICD-10-CM | POA: Diagnosis not present

## 2013-07-03 MED ORDER — DOXYCYCLINE HYCLATE 100 MG PO TABS: 100.0000 mg | ORAL_TABLET | Freq: Two times a day (BID) | ORAL | Status: DC

## 2013-07-03 NOTE — Patient Instructions (Signed)
Bartholin's Cyst and Abscess  Bartholin's glands produce mucus through small openings just outside the opening of the vagina. The mucus helps with lubrication around the vagina during sexual intercourse. If the duct becomes clogged, the gland will swell and cause a bulge on the inside of the vagina. If this becomes big enough, it can be seen and felt on the outside of the vagina as well. Sometimes, the swelling will shrink away by itself. However, if the cyst becomes infected, the Bartholin's cyst fills with pus and becomes more swollen, red and painful and becomes a Bartholin's abscess. This usually requires antibiotic treatment and surgical drainage. Sometimes, with minor surgery under local anesthesia, a small tube is placed in the cyst or abscess wall. This allows continued drainage for up to 6 weeks. Minor surgery can make a new opening to replace the clogged duct and help prevent future cysts or abscess.  If the abscess occurs several times, a minor operation with local anesthesia is necessary to remove the Bartholin's gland completely or to make it drain better. Cutting open the gland and suturing the edges to make the opening of the gland bigger (marsupialization) may be needed and should usually be done by your obstetrician-gyncology physician. Antibiotics are usually prescribed for this condition. Take all antibiotics as prescribed. Make sure to finish them even if you are doing better. Take warm sitz baths for 20 minutes, 3 times a day. See your caregiver for follow-up care as recommended.  SEEK MEDICAL CARE IF:    You have increasing pain, swelling, or redness near the vagina.   You have vomiting or inability to tolerate medicines.   You have a fever.   You have uncontrolled bleeding from the vagina.  Document Released: 11/29/2004 Document Revised: 01/14/2012 Document Reviewed: 12/02/2009  [employer] Patient Information 2014 [employer]

## 2013-07-03 NOTE — Progress Notes (Signed)
  Subjective:    Patient ID: Vicki Mccoy, female    DOB: 1981/07/03, 32 y.o.   MRN: [id number]  HPI CO uncomfortable lump right lower vaginal area. Not red or extreme tender. Present for week or 2. NIDDM controlled   Review of Systems     Objective:   Physical Exam  Constitutional: She is oriented to person, place, and time. She appears well-developed and well-nourished.  HENT:  Head: Normocephalic.  Eyes: EOM are normal.  Pulmonary/Chest: Effort normal.  Genitourinary:    There is no rash, tenderness or lesion on the right labia. There is no rash, tenderness or lesion on the left labia. There is tenderness around the vagina. No erythema around the vagina.  Smooth cystic 1 cm mass at introitus right  Musculoskeletal: Normal range of motion.  Lymphadenopathy:       Right: No inguinal adenopathy present.       Left: No inguinal adenopathy present.  Neurological: She is alert and oriented to person, place, and time. No cranial nerve deficit. She exhibits normal muscle tone. Coordination normal.  Skin: No rash noted.  Psychiatric: She has a normal mood and affect.          Assessment & Plan:  Bartholins cyst Refer to gyn Doxycycline/sitz

## 2013-07-08 ENCOUNTER — Encounter: Payer: Self-pay | Admitting: Obstetrics & Gynecology

## 2013-07-08 ENCOUNTER — Ambulatory Visit (INDEPENDENT_AMBULATORY_CARE_PROVIDER_SITE_OTHER): Payer: Medicare Other | Admitting: Obstetrics & Gynecology

## 2013-07-08 VITALS — BP 129/82 | HR 74 | Temp 98.2°F | Ht 62.0 in | Wt 165.4 lb

## 2013-07-08 DIAGNOSIS — N751 Abscess of Bartholin's gland: Secondary | ICD-10-CM | POA: Diagnosis not present

## 2013-07-08 NOTE — Progress Notes (Signed)
.   Subjective:     Vicki Mccoy is a 32 y.o. female here for a problem exam.  Current complaints:Patient went to the Urgent care on Friday for a Bartholin cyst (for 2 weeks).  She was given an antibitotic and to follow up up with her doctor.  Personal health questionnaire reviewed: no.   Gynecologic History Patient's last menstrual period was 05/10/2013. Contraception: OCP (estrogen/progesterone) Last Pap: 02/2013. Results were: normal Last mammogram: N/A  Obstetric History OB History  Gravida Para Term Preterm AB SAB TAB Ectopic Multiple Living  2 2 1 1  0 0    1    # Outcome Date GA Lbr Len/2nd Weight Sex Delivery Anes PTL Lv  2 PRE 2008     SVD        Comments: 23 weeks  1 TRM 2007     SVD          The following portions of the patient's history were reviewed and updated as appropriate: allergies, current medications, past family history, past medical history, past social history, past surgical history and problem list.  Review of Systems Pertinent items are noted in HPI.    Objective:     1 -2 cm, right-sided Bartholin's gland abscess     Assessment:    Bartholin's gland abscess    Plan:   Continue antibiotics Return in 4 weeks or prn

## 2013-07-10 ENCOUNTER — Encounter: Payer: Self-pay | Admitting: Obstetrics & Gynecology

## 2013-07-10 NOTE — Patient Instructions (Signed)
Bartholin's Cyst and Abscess  Bartholin's glands produce mucus through small openings just outside the opening of the vagina. The mucus helps with lubrication around the vagina during sexual intercourse. If the duct becomes clogged, the gland will swell and cause a bulge on the inside of the vagina. If this becomes big enough, it can be seen and felt on the outside of the vagina as well. Sometimes, the swelling will shrink away by itself. However, if the cyst becomes infected, the Bartholin's cyst fills with pus and becomes more swollen, red and painful and becomes a Bartholin's abscess. This usually requires antibiotic treatment and surgical drainage. Sometimes, with minor surgery under local anesthesia, a small tube is placed in the cyst or abscess wall. This allows continued drainage for up to 6 weeks. Minor surgery can make a new opening to replace the clogged duct and help prevent future cysts or abscess.  If the abscess occurs several times, a minor operation with local anesthesia is necessary to remove the Bartholin's gland completely or to make it drain better. Cutting open the gland and suturing the edges to make the opening of the gland bigger (marsupialization) may be needed and should usually be done by your obstetrician-gyncology physician. Antibiotics are usually prescribed for this condition. Take all antibiotics as prescribed. Make sure to finish them even if you are doing better. Take warm sitz baths for 20 minutes, 3 times a day. See your caregiver for follow-up care as recommended.  SEEK MEDICAL CARE IF:    You have increasing pain, swelling, or redness near the vagina.   You have vomiting or inability to tolerate medicines.   You have a fever.   You have uncontrolled bleeding from the vagina.  Document Released: 11/29/2004 Document Revised: 01/14/2012 Document Reviewed: 12/02/2009  [employer] Patient Information 2014 [employer]

## 2013-07-10 NOTE — Progress Notes (Signed)
WORD CATHETER PROCEDURE NOTE  A time-out was performed confirming the procedure and allergy status.  The skin was prepped.  The skin was infiltrated with 1% lidocaine plain.  A word catheter was inserted through a stab incision into the right-sided abscess.

## 2013-07-16 ENCOUNTER — Ambulatory Visit (INDEPENDENT_AMBULATORY_CARE_PROVIDER_SITE_OTHER): Payer: Medicare Other | Admitting: Obstetrics & Gynecology

## 2013-07-16 ENCOUNTER — Encounter: Payer: Self-pay | Admitting: Obstetrics & Gynecology

## 2013-07-16 VITALS — BP 122/74 | HR 61 | Temp 96.8°F | Ht 64.0 in | Wt 168.0 lb

## 2013-07-16 DIAGNOSIS — N751 Abscess of Bartholin's gland: Secondary | ICD-10-CM

## 2013-07-16 DIAGNOSIS — E559 Vitamin D deficiency, unspecified: Secondary | ICD-10-CM | POA: Diagnosis not present

## 2013-07-16 DIAGNOSIS — E669 Obesity, unspecified: Secondary | ICD-10-CM | POA: Diagnosis not present

## 2013-07-16 DIAGNOSIS — N760 Acute vaginitis: Secondary | ICD-10-CM | POA: Diagnosis not present

## 2013-07-16 DIAGNOSIS — M549 Dorsalgia, unspecified: Secondary | ICD-10-CM | POA: Diagnosis not present

## 2013-07-16 DIAGNOSIS — R946 Abnormal results of thyroid function studies: Secondary | ICD-10-CM | POA: Diagnosis not present

## 2013-07-16 DIAGNOSIS — Z309 Encounter for contraceptive management, unspecified: Secondary | ICD-10-CM | POA: Diagnosis not present

## 2013-07-16 DIAGNOSIS — E119 Type 2 diabetes mellitus without complications: Secondary | ICD-10-CM | POA: Diagnosis not present

## 2013-07-16 DIAGNOSIS — N390 Urinary tract infection, site not specified: Secondary | ICD-10-CM | POA: Diagnosis not present

## 2013-07-16 DIAGNOSIS — Z23 Encounter for immunization: Secondary | ICD-10-CM | POA: Diagnosis not present

## 2013-07-16 LAB — POCT WET PREP (WET MOUNT): Clue Cells Wet Prep Whiff POC: POSITIVE

## 2013-07-16 NOTE — Progress Notes (Signed)
Subjective:     Vicki Mccoy is a 32 y.o. female here for a routine exam.  Current complaints: follow up to a Bartholin's Cyst. Pt states it has been draining quite a bit.    Personal health questionnaire reviewed: yes.   Gynecologic History Patient's last menstrual period was 07/07/2013. Contraception: OCP (estrogen/progesterone)   Obstetric History OB History  Gravida Para Term Preterm AB SAB TAB Ectopic Multiple Living  2 2 1 1  0 0    1    # Outcome Date GA Lbr Len/2nd Weight Sex Delivery Anes PTL Lv  2 PRE 2008     SVD        Comments: 23 weeks  1 TRM 2007     SVD          The following portions of the patient's history were reviewed and updated as appropriate: allergies, current medications, past family history, past medical history, past social history, past surgical history and problem list.  Review of Systems Endocrine: positive for hot flashes    Objective:     Word catheter in place.  No inflammatory changes     Assessment:    Bartholin's gland abscess/Word catheter in place ?Vaginitis  Plan:   Orders Placed This Encounter  Procedures  . WET PREP BY MOLECULAR PROBE  Return in a few weeks for Word catheter removal

## 2013-07-17 DIAGNOSIS — J301 Allergic rhinitis due to pollen: Secondary | ICD-10-CM | POA: Diagnosis not present

## 2013-07-17 LAB — WET PREP BY MOLECULAR PROBE
Candida species: NEGATIVE
Trichomonas vaginosis: NEGATIVE

## 2013-07-26 ENCOUNTER — Telehealth: Payer: Self-pay | Admitting: *Deleted

## 2013-07-26 MED ORDER — METRONIDAZOLE 500 MG PO TABS: 500.0000 mg | ORAL_TABLET | Freq: Two times a day (BID) | ORAL | Status: AC

## 2013-07-26 NOTE — Telephone Encounter (Signed)
Message copied by Glendell Docker on Sun Jul 26, 2013  1:52 PM ------      Message from: Antionette Char      Created: Mon Jul 20, 2013  8:12 AM       Offer rx for BV ------

## 2013-07-26 NOTE — Telephone Encounter (Signed)
Call placed to patient at [phone], she was offered per Dr. Tamela Oddi instructions. Patient states she still has yellow drainage. Patient advised of rx to pharmacy, and if no improvement she was advised to call office. Patient verbalized understanding and agrees as instructed.

## 2013-07-30 ENCOUNTER — Encounter: Payer: Self-pay | Admitting: Obstetrics & Gynecology

## 2013-07-30 ENCOUNTER — Ambulatory Visit (INDEPENDENT_AMBULATORY_CARE_PROVIDER_SITE_OTHER): Payer: Medicare Other | Admitting: Obstetrics & Gynecology

## 2013-07-30 VITALS — BP 118/75 | HR 52 | Temp 98.2°F | Ht 64.0 in | Wt 165.8 lb

## 2013-07-30 DIAGNOSIS — N751 Abscess of Bartholin's gland: Secondary | ICD-10-CM

## 2013-07-30 NOTE — Progress Notes (Signed)
.   Subjective:     Vicki Mccoy is a 32 y.o. female here for a follow up exam.  Current complaints: bartholin catheter is in place still.  Patient states that it is not draining much at all.  Denies any pain.  Personal health questionnaire reviewed: no.   Gynecologic History Patient's last menstrual period was 07/07/2013. Contraception: OCP (estrogen/progesterone)   Obstetric History OB History  Gravida Para Term Preterm AB SAB TAB Ectopic Multiple Living  2 2 1 1  0 0    1    # Outcome Date GA Lbr Len/2nd Weight Sex Delivery Anes PTL Lv  2 PRE 2008     SVD        Comments: 23 weeks  1 TRM 2007     SVD          The following portions of the patient's history were reviewed and updated as appropriate: allergies, current medications, past family history, past medical history, past social history, past surgical history and problem list.  Review of Systems Pertinent items are noted in HPI.    Objective:    Pelvic: the balloon was deflated; the catheter was sitting in the vagina.  A fistulous tract was present.  No tenderness, mass, drainage.  Assessment:    Bartholin's gland abscess resolved  Plan:    Return prn

## 2013-08-17 DIAGNOSIS — J301 Allergic rhinitis due to pollen: Secondary | ICD-10-CM | POA: Diagnosis not present

## 2013-08-24 DIAGNOSIS — E669 Obesity, unspecified: Secondary | ICD-10-CM | POA: Diagnosis not present

## 2013-08-24 DIAGNOSIS — E559 Vitamin D deficiency, unspecified: Secondary | ICD-10-CM | POA: Diagnosis not present

## 2013-08-24 DIAGNOSIS — Z309 Encounter for contraceptive management, unspecified: Secondary | ICD-10-CM | POA: Diagnosis not present

## 2013-08-24 DIAGNOSIS — E119 Type 2 diabetes mellitus without complications: Secondary | ICD-10-CM | POA: Diagnosis not present

## 2013-08-24 DIAGNOSIS — R946 Abnormal results of thyroid function studies: Secondary | ICD-10-CM | POA: Diagnosis not present

## 2013-08-24 DIAGNOSIS — B379 Candidiasis, unspecified: Secondary | ICD-10-CM | POA: Diagnosis not present

## 2013-08-24 DIAGNOSIS — M549 Dorsalgia, unspecified: Secondary | ICD-10-CM | POA: Diagnosis not present

## 2013-09-05 ENCOUNTER — Other Ambulatory Visit: Payer: Self-pay | Admitting: Obstetrics & Gynecology

## 2013-09-07 NOTE — Telephone Encounter (Signed)
Please Review

## 2013-09-08 DIAGNOSIS — E119 Type 2 diabetes mellitus without complications: Secondary | ICD-10-CM | POA: Diagnosis not present

## 2013-09-08 DIAGNOSIS — E1159 Type 2 diabetes mellitus with other circulatory complications: Secondary | ICD-10-CM | POA: Diagnosis not present

## 2013-09-08 DIAGNOSIS — R5381 Other malaise: Secondary | ICD-10-CM | POA: Diagnosis not present

## 2013-09-08 DIAGNOSIS — R946 Abnormal results of thyroid function studies: Secondary | ICD-10-CM | POA: Diagnosis not present

## 2013-09-08 DIAGNOSIS — E559 Vitamin D deficiency, unspecified: Secondary | ICD-10-CM | POA: Diagnosis not present

## 2013-09-16 DIAGNOSIS — J301 Allergic rhinitis due to pollen: Secondary | ICD-10-CM | POA: Diagnosis not present

## 2013-09-21 ENCOUNTER — Other Ambulatory Visit: Payer: Medicare Other

## 2013-10-02 DIAGNOSIS — H52229 Regular astigmatism, unspecified eye: Secondary | ICD-10-CM | POA: Diagnosis not present

## 2013-10-02 DIAGNOSIS — H35389 Toxic maculopathy, unspecified eye: Secondary | ICD-10-CM | POA: Diagnosis not present

## 2013-10-02 DIAGNOSIS — H04129 Dry eye syndrome of unspecified lacrimal gland: Secondary | ICD-10-CM | POA: Diagnosis not present

## 2013-10-02 DIAGNOSIS — H5210 Myopia, unspecified eye: Secondary | ICD-10-CM | POA: Diagnosis not present

## 2013-10-05 ENCOUNTER — Other Ambulatory Visit: Payer: Self-pay | Admitting: Obstetrics & Gynecology

## 2013-10-06 ENCOUNTER — Other Ambulatory Visit: Payer: Self-pay | Admitting: *Deleted

## 2013-10-06 DIAGNOSIS — IMO0001 Reserved for inherently not codable concepts without codable children: Secondary | ICD-10-CM

## 2013-10-06 MED ORDER — NORGESTIM-ETH ESTRAD TRIPHASIC 0.18/0.215/0.25 MG-35 MCG PO TABS: 1.0000 | ORAL_TABLET | Freq: Every day | ORAL | Status: DC

## 2013-10-13 DIAGNOSIS — E119 Type 2 diabetes mellitus without complications: Secondary | ICD-10-CM | POA: Diagnosis not present

## 2013-10-13 DIAGNOSIS — E669 Obesity, unspecified: Secondary | ICD-10-CM | POA: Diagnosis not present

## 2013-10-13 DIAGNOSIS — E559 Vitamin D deficiency, unspecified: Secondary | ICD-10-CM | POA: Diagnosis not present

## 2013-10-13 DIAGNOSIS — M549 Dorsalgia, unspecified: Secondary | ICD-10-CM | POA: Diagnosis not present

## 2013-10-13 DIAGNOSIS — Z309 Encounter for contraceptive management, unspecified: Secondary | ICD-10-CM | POA: Diagnosis not present

## 2013-10-19 ENCOUNTER — Encounter (INDEPENDENT_AMBULATORY_CARE_PROVIDER_SITE_OTHER): Payer: Medicare Other | Admitting: Ophthalmology

## 2013-10-19 DIAGNOSIS — H43819 Vitreous degeneration, unspecified eye: Secondary | ICD-10-CM

## 2013-10-19 DIAGNOSIS — E1139 Type 2 diabetes mellitus with other diabetic ophthalmic complication: Secondary | ICD-10-CM | POA: Diagnosis not present

## 2013-10-19 DIAGNOSIS — E11319 Type 2 diabetes mellitus with unspecified diabetic retinopathy without macular edema: Secondary | ICD-10-CM | POA: Diagnosis not present

## 2013-11-02 ENCOUNTER — Ambulatory Visit (INDEPENDENT_AMBULATORY_CARE_PROVIDER_SITE_OTHER): Payer: Medicare Other | Admitting: Physician Assistant

## 2013-11-02 VITALS — BP 130/80 | HR 60 | Temp 97.7°F | Resp 16 | Ht 62.0 in | Wt 167.8 lb

## 2013-11-02 DIAGNOSIS — R109 Unspecified abdominal pain: Secondary | ICD-10-CM

## 2013-11-02 DIAGNOSIS — R3915 Urgency of urination: Secondary | ICD-10-CM

## 2013-11-02 DIAGNOSIS — N898 Other specified noninflammatory disorders of vagina: Secondary | ICD-10-CM

## 2013-11-02 DIAGNOSIS — R350 Frequency of micturition: Secondary | ICD-10-CM

## 2013-11-02 LAB — POCT URINALYSIS DIPSTICK
Bilirubin, UA: NEGATIVE
Blood, UA: NEGATIVE
Glucose, UA: NEGATIVE
Ketones, UA: NEGATIVE
Spec Grav, UA: 1.01
pH, UA: 6

## 2013-11-02 LAB — POCT WET PREP WITH KOH: Yeast Wet Prep HPF POC: NEGATIVE

## 2013-11-02 LAB — POCT UA - MICROSCOPIC ONLY: Mucus, UA: NEGATIVE

## 2013-11-02 MED ORDER — PHENAZOPYRIDINE HCL 200 MG PO TABS: 200.0000 mg | ORAL_TABLET | Freq: Three times a day (TID) | ORAL | Status: DC | PRN

## 2013-11-02 NOTE — Progress Notes (Signed)
Subjective:    Patient ID: Vicki Mccoy, female    DOB: 1981-09-19, 32 y.o.   MRN: [id number]  PCP: Dr. Julio Sicks  Chief Complaint  Patient presents with  . Urinary Tract Infection    urinary freq/urg, odor to urine x 4 days  . Vaginal Discharge    intermittent dark brownish discharge w/itching x 4 days, menstrual cycle started Thurs but then stopped Sxs started.  . Flank Pain    bilaterally at different times   Medications, allergies, past medical history, surgical history, family history, social history and problem list reviewed and updated.  HPI Concern for UTI vs. Vaginal infection.  Has had urinary urgency, frequency and odor x 4 days.  No burning with urination, but has LEFT flank pain when gets the urge to urinate.  Started her period 10/29/2013, but since then has had only intermittent brownish discharge. Some itching. Period generally lasts 4-5 days. Monogamous sex. Denies any missed doses of contraceptive pill. At one point, her PCP advised a switch from [place] to [place], but she continues on Triness[place].  No fever, chills, nausea, vomiting, diarrhea.  No joint/muscle aches.  No dizziness.  No increased thirst.  Review of Systems As above.    Objective:   Physical Exam  Constitutional: She is oriented to person, place, and time. Vital signs are normal. She appears well-developed and well-nourished. She is active and cooperative. No distress.  HENT:  Head: Normocephalic and atraumatic.  Right Ear: Hearing normal.  Left Ear: Hearing normal.  Eyes: EOM are normal. Pupils are equal, round, and reactive to light.  Neck: Normal range of motion. Neck supple. No thyromegaly present.  Cardiovascular: Normal rate, regular rhythm and normal heart sounds.   Pulses:      Radial pulses are 2+ on the right side, and 2+ on the left side.       Dorsalis pedis pulses are 2+ on the right side, and 2+ on the left side.       Posterior tibial pulses are 2+ on the right side, and 2+  on the left side.  Pulmonary/Chest: Effort normal and breath sounds normal.  Abdominal: Hernia confirmed negative in the right inguinal area and confirmed negative in the left inguinal area.  Genitourinary: Uterus normal. Rectal exam shows no external hemorrhoid and no fissure. Pelvic exam was performed with patient supine. No labial fusion. There is no rash, tenderness, lesion or injury on the right labia. There is no rash, tenderness, lesion or injury on the left labia. Cervix exhibits no motion tenderness, no discharge and no friability. Right adnexum displays no mass, no tenderness and no fullness. Left adnexum displays no mass, no tenderness and no fullness. No erythema, tenderness or bleeding around the vagina. No foreign body around the vagina. No signs of injury around the vagina. No vaginal discharge found.  Lymphadenopathy:       Head (right side): No tonsillar, no preauricular, no posterior auricular and no occipital adenopathy present.       Head (left side): No tonsillar, no preauricular, no posterior auricular and no occipital adenopathy present.    She has no cervical adenopathy.       Right: No inguinal and no supraclavicular adenopathy present.       Left: No inguinal and no supraclavicular adenopathy present.  Neurological: She is alert and oriented to person, place, and time. No sensory deficit.  Skin: Skin is warm, dry and intact. No rash noted. No cyanosis or erythema. Nails show no  clubbing.  Psychiatric: She has a normal mood and affect.      Results for orders placed in visit on 11/02/13  POCT UA - MICROSCOPIC ONLY      Result Value Range   WBC, Ur, HPF, POC 0-4     RBC, urine, microscopic neg     Bacteria, U Microscopic trace     Mucus, UA neg     Epithelial cells, urine per micros 1-2     Crystals, Ur, HPF, POC neg     Casts, Ur, LPF, POC neg     Yeast, UA neg    POCT URINALYSIS DIPSTICK      Result Value Range   Color, UA yellow     Clarity, UA clear      Glucose, UA neg     Bilirubin, UA neg     Ketones, UA neg     Spec Grav, UA 1.010     Blood, UA neg     pH, UA 6.0     Protein, UA neg     Urobilinogen, UA 0.2     Nitrite, UA neg     Leukocytes, UA small (1+)    POCT WET PREP WITH KOH      Result Value Range   Trichomonas, UA Negative     Clue Cells Wet Prep HPF POC 0-1     Epithelial Wet Prep HPF POC 2-3     Yeast Wet Prep HPF POC negative     Bacteria Wet Prep HPF POC 1+     RBC Wet Prep HPF POC 0-2     WBC Wet Prep HPF POC 3-6     KOH Prep POC Negative    POCT URINE PREGNANCY      Result Value Range   Preg Test, Ur Negative         Assessment & Plan:  1. Urinary frequency 2. Urinary urgency 3. Flank pain Await Urine culture.  Supportive care and pyridium for now. Contact me if symptoms worsen or new ones develop. - POCT UA - Microscopic Only - POCT urinalysis dipstick - POCT urine pregnancy - Urine culture - phenazopyridine (PYRIDIUM) 200 MG tablet; Take 1 tablet (200 mg total) by mouth 3 (three) times daily as needed for pain.  Dispense: 10 tablet; Refill: 0  4. Vaginal discharge Suspect this is a transient menstrual irregularity and not a concern, however, if it persists, consider change in contraceptive pill, which she is encouraged to do with her PCP. - POCT Wet Prep with KOH   Fernande Bras, PA-C Physician Assistant-Certified [facility]

## 2013-11-02 NOTE — Patient Instructions (Signed)
I will contact you with your lab results as soon as they are available.   If you have not heard from me in 2 weeks, please contact me.  The fastest way to get your results is to register for My Chart (see the instructions on the last page of this printout).  Get plenty of rest and drink at least 64 ounces of water daily.  

## 2013-11-04 LAB — URINE CULTURE: Colony Count: 5000

## 2013-11-12 DIAGNOSIS — Z79899 Other long term (current) drug therapy: Secondary | ICD-10-CM | POA: Diagnosis not present

## 2013-11-12 DIAGNOSIS — Z049 Encounter for examination and observation for unspecified reason: Secondary | ICD-10-CM | POA: Diagnosis not present

## 2013-11-12 DIAGNOSIS — IMO0001 Reserved for inherently not codable concepts without codable children: Secondary | ICD-10-CM | POA: Diagnosis not present

## 2013-11-12 DIAGNOSIS — G43719 Chronic migraine without aura, intractable, without status migrainosus: Secondary | ICD-10-CM | POA: Diagnosis not present

## 2013-11-12 DIAGNOSIS — G43839 Menstrual migraine, intractable, without status migrainosus: Secondary | ICD-10-CM | POA: Diagnosis not present

## 2013-11-12 DIAGNOSIS — R51 Headache: Secondary | ICD-10-CM | POA: Diagnosis not present

## 2013-12-01 DIAGNOSIS — G518 Other disorders of facial nerve: Secondary | ICD-10-CM | POA: Diagnosis not present

## 2013-12-01 DIAGNOSIS — M542 Cervicalgia: Secondary | ICD-10-CM | POA: Diagnosis not present

## 2013-12-01 DIAGNOSIS — R51 Headache: Secondary | ICD-10-CM | POA: Diagnosis not present

## 2013-12-01 DIAGNOSIS — IMO0001 Reserved for inherently not codable concepts without codable children: Secondary | ICD-10-CM | POA: Diagnosis not present

## 2014-01-11 DIAGNOSIS — Z0110 Encounter for examination of ears and hearing without abnormal findings: Secondary | ICD-10-CM | POA: Diagnosis not present

## 2014-01-11 DIAGNOSIS — E119 Type 2 diabetes mellitus without complications: Secondary | ICD-10-CM | POA: Diagnosis not present

## 2014-01-11 DIAGNOSIS — Z309 Encounter for contraceptive management, unspecified: Secondary | ICD-10-CM | POA: Diagnosis not present

## 2014-01-11 DIAGNOSIS — Z0000 Encounter for general adult medical examination without abnormal findings: Secondary | ICD-10-CM | POA: Diagnosis not present

## 2014-01-11 DIAGNOSIS — I10 Essential (primary) hypertension: Secondary | ICD-10-CM | POA: Diagnosis not present

## 2014-01-11 DIAGNOSIS — N390 Urinary tract infection, site not specified: Secondary | ICD-10-CM | POA: Diagnosis not present

## 2014-01-11 DIAGNOSIS — Z136 Encounter for screening for cardiovascular disorders: Secondary | ICD-10-CM | POA: Diagnosis not present

## 2014-01-11 DIAGNOSIS — E669 Obesity, unspecified: Secondary | ICD-10-CM | POA: Diagnosis not present

## 2014-01-11 DIAGNOSIS — E559 Vitamin D deficiency, unspecified: Secondary | ICD-10-CM | POA: Diagnosis not present

## 2014-01-11 DIAGNOSIS — M549 Dorsalgia, unspecified: Secondary | ICD-10-CM | POA: Diagnosis not present

## 2014-01-15 DIAGNOSIS — R509 Fever, unspecified: Secondary | ICD-10-CM | POA: Diagnosis not present

## 2014-01-15 DIAGNOSIS — E119 Type 2 diabetes mellitus without complications: Secondary | ICD-10-CM | POA: Diagnosis not present

## 2014-01-15 DIAGNOSIS — Z309 Encounter for contraceptive management, unspecified: Secondary | ICD-10-CM | POA: Diagnosis not present

## 2014-01-15 DIAGNOSIS — E669 Obesity, unspecified: Secondary | ICD-10-CM | POA: Diagnosis not present

## 2014-01-15 DIAGNOSIS — J029 Acute pharyngitis, unspecified: Secondary | ICD-10-CM | POA: Diagnosis not present

## 2014-01-15 DIAGNOSIS — M549 Dorsalgia, unspecified: Secondary | ICD-10-CM | POA: Diagnosis not present

## 2014-01-15 DIAGNOSIS — E559 Vitamin D deficiency, unspecified: Secondary | ICD-10-CM | POA: Diagnosis not present

## 2014-01-26 DIAGNOSIS — Z309 Encounter for contraceptive management, unspecified: Secondary | ICD-10-CM | POA: Diagnosis not present

## 2014-01-26 DIAGNOSIS — E119 Type 2 diabetes mellitus without complications: Secondary | ICD-10-CM | POA: Diagnosis not present

## 2014-01-26 DIAGNOSIS — M549 Dorsalgia, unspecified: Secondary | ICD-10-CM | POA: Diagnosis not present

## 2014-01-26 DIAGNOSIS — E669 Obesity, unspecified: Secondary | ICD-10-CM | POA: Diagnosis not present

## 2014-01-26 DIAGNOSIS — E559 Vitamin D deficiency, unspecified: Secondary | ICD-10-CM | POA: Diagnosis not present

## 2014-01-26 DIAGNOSIS — R059 Cough, unspecified: Secondary | ICD-10-CM | POA: Diagnosis not present

## 2014-01-26 DIAGNOSIS — R05 Cough: Secondary | ICD-10-CM | POA: Diagnosis not present

## 2014-01-26 DIAGNOSIS — J3089 Other allergic rhinitis: Secondary | ICD-10-CM | POA: Diagnosis not present

## 2014-02-18 IMAGING — CR DG LUMBAR SPINE 2-3V
2 series · 2 of 2 positions shown · non-contrast
Comparison: None.

CLINICAL DATA: Low back pain x 3 months

LUMBAR SPINE - 2-3 VIEW

[AP]
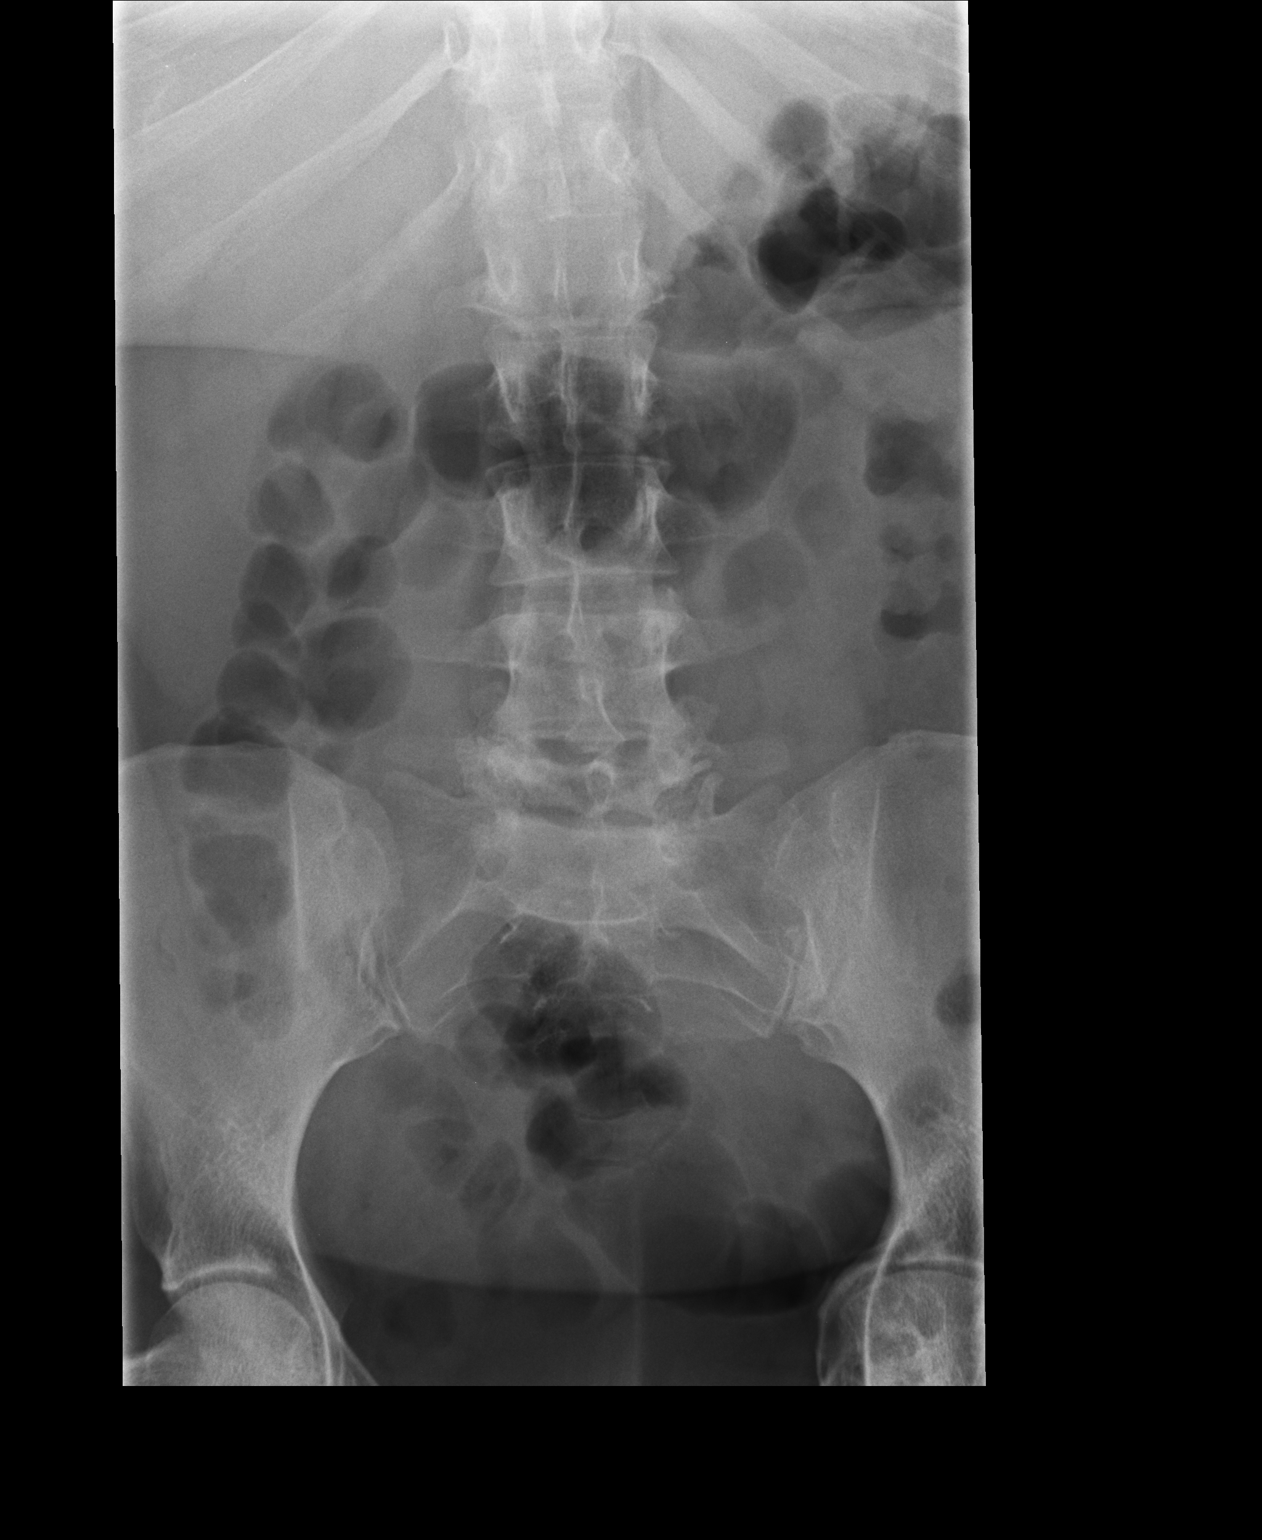

[lateral]
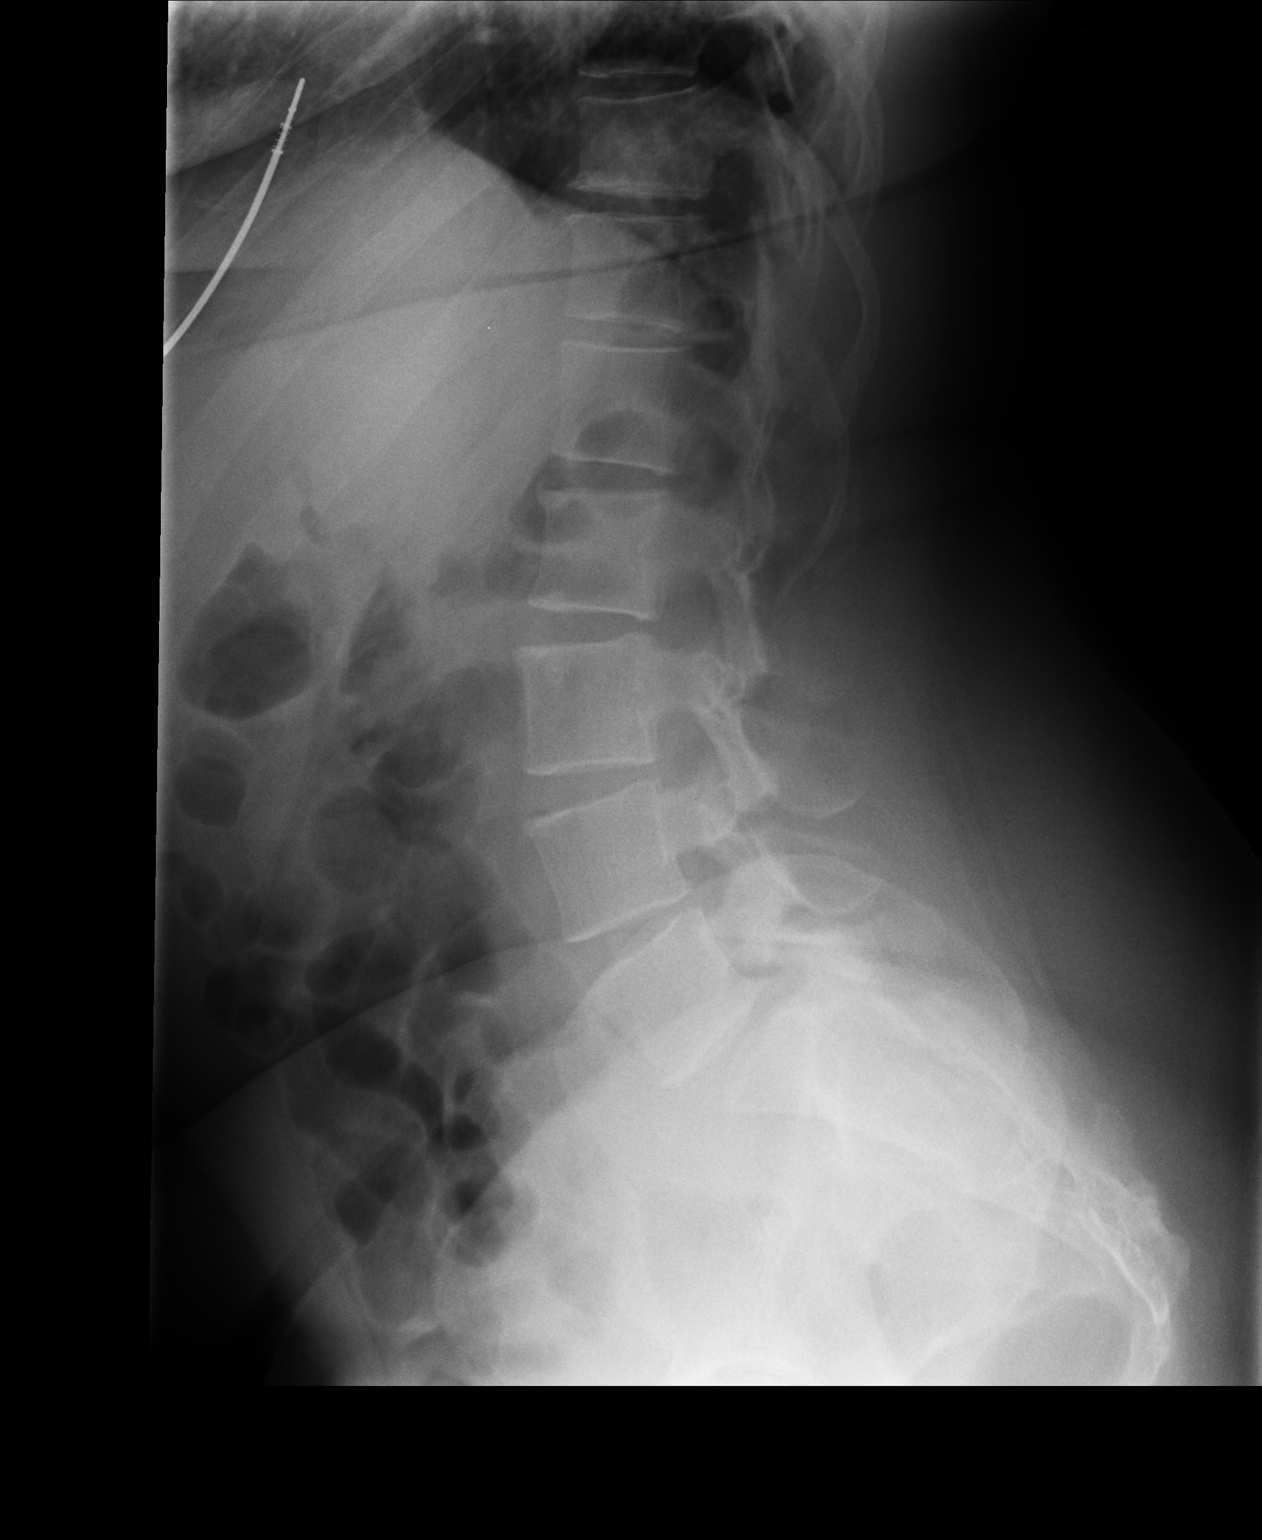

[2 of 2 positions shown; findings below may reference images not displayed]

FINDINGS: Five lumbar-type vertebral bodies.

Exaggerated lumbar lordosis.

No evidence of fracture or dislocation.  The vertebral body heights
are maintained.

Very mild degenerative changes at L5-S1.

Visualized bony pelvis appears intact.

Degenerative changes of the left hip with the acetabular protrusio.
IMPRESSION: No fracture or dislocation is seen.

Degenerative changes of the left hip with acetabular protrusio.

Clinically significant discrepancy from primary report, if
provided: None

## 2014-04-23 DIAGNOSIS — Z3200 Encounter for pregnancy test, result unknown: Secondary | ICD-10-CM | POA: Diagnosis not present

## 2014-04-23 DIAGNOSIS — N760 Acute vaginitis: Secondary | ICD-10-CM | POA: Diagnosis not present

## 2014-04-23 DIAGNOSIS — R946 Abnormal results of thyroid function studies: Secondary | ICD-10-CM | POA: Diagnosis not present

## 2014-04-23 DIAGNOSIS — N898 Other specified noninflammatory disorders of vagina: Secondary | ICD-10-CM | POA: Diagnosis not present

## 2014-04-23 DIAGNOSIS — E559 Vitamin D deficiency, unspecified: Secondary | ICD-10-CM | POA: Diagnosis not present

## 2014-04-23 DIAGNOSIS — M549 Dorsalgia, unspecified: Secondary | ICD-10-CM | POA: Diagnosis not present

## 2014-04-23 DIAGNOSIS — Z309 Encounter for contraceptive management, unspecified: Secondary | ICD-10-CM | POA: Diagnosis not present

## 2014-04-23 DIAGNOSIS — E669 Obesity, unspecified: Secondary | ICD-10-CM | POA: Diagnosis not present

## 2014-04-23 DIAGNOSIS — E119 Type 2 diabetes mellitus without complications: Secondary | ICD-10-CM | POA: Diagnosis not present

## 2014-04-23 DIAGNOSIS — J3089 Other allergic rhinitis: Secondary | ICD-10-CM | POA: Diagnosis not present

## 2014-04-26 DIAGNOSIS — N760 Acute vaginitis: Secondary | ICD-10-CM | POA: Diagnosis not present

## 2014-05-17 ENCOUNTER — Encounter: Payer: Self-pay | Admitting: Obstetrics & Gynecology

## 2014-05-17 ENCOUNTER — Ambulatory Visit (INDEPENDENT_AMBULATORY_CARE_PROVIDER_SITE_OTHER): Payer: Medicare Other | Admitting: Obstetrics & Gynecology

## 2014-05-17 VITALS — BP 134/70 | HR 87 | Temp 97.3°F | Ht 64.0 in | Wt 177.0 lb

## 2014-05-17 DIAGNOSIS — Z3202 Encounter for pregnancy test, result negative: Secondary | ICD-10-CM

## 2014-05-17 DIAGNOSIS — N83209 Unspecified ovarian cyst, unspecified side: Secondary | ICD-10-CM

## 2014-05-17 LAB — POCT URINE PREGNANCY: PREG TEST UR: NEGATIVE

## 2014-05-18 NOTE — Patient Instructions (Signed)
Ovarian Cyst An ovarian cyst is a fluid-filled sac that forms on an ovary. The ovaries are small organs that produce eggs in women. Various types of cysts can form on the ovaries. Most are not cancerous. Many do not cause problems, and they often go away on their own. Some may cause symptoms and require treatment. Common types of ovarian cysts include:  Functional cysts--These cysts may occur every month during the menstrual cycle. This is normal. The cysts usually go away with the next menstrual cycle if the woman does not get pregnant. Usually, there are no symptoms with a functional cyst.  Endometrioma cysts--These cysts form from the tissue that lines the uterus. They are also called "chocolate cysts" because they become filled with blood that turns brown. This type of cyst can cause pain in the lower abdomen during intercourse and with your menstrual period.  Cystadenoma cysts--This type develops from the cells on the outside of the ovary. These cysts can get very big and cause lower abdomen pain and pain with intercourse. This type of cyst can twist on itself, cut off its blood supply, and cause severe pain. It can also easily rupture and cause a lot of pain.  Dermoid cysts--This type of cyst is sometimes found in both ovaries. These cysts may contain different kinds of body tissue, such as skin, teeth, hair, or cartilage. They usually do not cause symptoms unless they get very big.  Theca lutein cysts--These cysts occur when too much of a certain hormone (human chorionic gonadotropin) is produced and overstimulates the ovaries to produce an egg. This is most common after procedures used to assist with the conception of a baby (in vitro fertilization). CAUSES   Fertility drugs can cause a condition in which multiple large cysts are formed on the ovaries. This is called ovarian hyperstimulation syndrome.  A condition called polycystic ovary syndrome can cause hormonal imbalances that can lead to  nonfunctional ovarian cysts. SIGNS AND SYMPTOMS  Many ovarian cysts do not cause symptoms. If symptoms are present, they may include:  Pelvic pain or pressure.  Pain in the lower abdomen.  Pain during sexual intercourse.  Increasing girth (swelling) of the abdomen.  Abnormal menstrual periods.  Increasing pain with menstrual periods.  Stopping having menstrual periods without being pregnant. DIAGNOSIS  These cysts are commonly found during a routine or annual pelvic exam. Tests may be ordered to find out more about the cyst. These tests may include:  Ultrasound.  X-ray of the pelvis.  CT scan.  MRI.  Blood tests. TREATMENT  Many ovarian cysts go away on their own without treatment. Your health care provider may want to check your cyst regularly for 2-3 months to see if it changes. For women in menopause, it is particularly important to monitor a cyst closely because of the higher rate of ovarian cancer in menopausal women. When treatment is needed, it may include any of the following:  A procedure to drain the cyst (aspiration). This may be done using a long needle and ultrasound. It can also be done through a laparoscopic procedure. This involves using a thin, lighted tube with a tiny camera on the end (laparoscope) inserted through a small incision.  Surgery to remove the whole cyst. This may be done using laparoscopic surgery or an open surgery involving a larger incision in the lower abdomen.  Hormone treatment or birth control pills. These methods are sometimes used to help dissolve a cyst. HOME CARE INSTRUCTIONS   Only take over-the-counter   or prescription medicines as directed by your health care provider.  Follow up with your health care provider as directed.  Get regular pelvic exams and Pap tests. SEEK MEDICAL CARE IF:   Your periods are late, irregular, or painful, or they stop.  Your pelvic pain or abdominal pain does not go away.  Your abdomen becomes  larger or swollen.  You have pressure on your bladder or trouble emptying your bladder completely.  You have pain during sexual intercourse.  You have feelings of fullness, pressure, or discomfort in your stomach.  You lose weight for no apparent reason.  You feel generally ill.  You become constipated.  You lose your appetite.  You develop acne.  You have an increase in body and facial hair.  You are gaining weight, without changing your exercise and eating habits.  You think you are pregnant. SEEK IMMEDIATE MEDICAL CARE IF:   You have increasing abdominal pain.  You feel sick to your stomach (nauseous), and you throw up (vomit).  You develop a fever that comes on suddenly.  You have abdominal pain during a bowel movement.  Your menstrual periods become heavier than usual. MAKE SURE YOU:  Understand these instructions.  Will watch your condition.  Will get help right away if you are not doing well or get worse. Document Released: 10/22/2005 Document Revised: 10/27/2013 Document Reviewed: 06/29/2013 Exit[employer] Patient Information 2015 [employer]. This information is not intended to replace advice given to you by your health care provider. Make sure you discuss any questions you have with your health care provider.  

## 2014-05-18 NOTE — Progress Notes (Signed)
Patient ID: Vicki Mccoy, female   DOB: 04-07-1981, 33 y.o.   MRN: [id number]  Chief Complaint  Patient presents with  . Menstrual Problem    Patient states she has no had a cycle since May 2015. Patinet is currentl on an OCP    HPI [name] is a 33 y.o. female.  See above.  Denies missed pills/pain  HPI  Past Medical History  Diagnosis Date  . Diabetes mellitus   . Sickle cell trait   . Arthritis   . Allergy     History reviewed. No pertinent past surgical history.  Family History  Problem Relation Age of Onset  . Sickle cell anemia Mother   . Cerebral palsy Daughter     Social History History  Substance Use Topics  . Smoking status: Never Smoker   . Smokeless tobacco: Never Used  . Alcohol Use: No    No Known Allergies  Current Outpatient Prescriptions  Medication Sig Dispense Refill  . cholecalciferol (VITAMIN D) 1000 UNITS tablet Take 1,000 Units by mouth daily.      . metFORMIN (GLUCOPHAGE) 500 MG tablet Take 500 mg by mouth daily with breakfast.      . naproxen (NAPROSYN) 250 MG tablet Take by mouth 2 (two) times daily with a meal.      . Norgestimate-Ethinyl Estradiol Triphasic (TRINESSA, 28,) 0.18/0.215/0.25 MG-35 MCG tablet Take 1 tablet by mouth daily.  28 tablet  11   No current facility-administered medications for this visit.    Review of Systems Review of Systems Constitutional: negative for fatigue and weight loss Respiratory: negative for cough and wheezing Cardiovascular: negative for chest pain, fatigue and palpitations Gastrointestinal: negative for abdominal pain and change in bowel habits Genitourinary:negative for abnormal vaginal discharge Integument/breast: negative for nipple discharge Musculoskeletal:negative for myalgias Neurological: negative for gait problems and tremors Behavioral/Psych: negative for abusive relationship, depression Endocrine: negative for temperature intolerance     Blood pressure 134/70, pulse 87,  temperature 97.3 F (36.3 C), height 5\' 4"  (1.626 m), weight 80.287 kg (177 lb).  Physical Exam Physical Exam General:   alert  Skin:   no rash or abnormalities  Lungs:   clear to auscultation bilaterally  Heart:   regular rate and rhythm, S1, S2 normal, no murmur, click, rub or gallop  Abdomen:  normal findings: no organomegaly, soft, non-tender and no hernia  Pelvis:  External genitalia: normal general appearance Urinary system: urethral meatus normal and bladder without fullness, nontender Vaginal: normal without tenderness, induration or masses Cervix: normal appearance Adnexa: normal bimanual exam Uterus: anteverted and non-tender, normal size    Limited U/S: normal uterus; B/L adnexal cystic lesions-R>L  Data Reviewed UPT  Assessment    Functional ovarian cyst     Plan    Orders Placed This Encounter  Procedures  . US Pelvis Complete    Standing Status: Future     Number of Occurrences:      Standing Expiration Date: 07/19/2015    Scheduling Instructions:     For 2 months.    Order Specific Question:  Reason for Exam (SYMPTOM  OR DIAGNOSIS REQUIRED)    Answer:  Ovarian Cyst    Order Specific Question:  Preferred imaging location?    Answer:  Internal  . POCT urine pregnancy   Meds ordered this encounter  Medications  . naproxen (NAPROSYN) 250 MG tablet    Sig: Take by mouth 2 (two) times daily with a meal.    Possible management options  include:expectant for now Follow up as needed or in a few months         JACKSON-MOORE,Faron Tudisco A 05/18/2014, 6:33 PM

## 2014-05-21 DIAGNOSIS — J3089 Other allergic rhinitis: Secondary | ICD-10-CM | POA: Diagnosis not present

## 2014-05-21 DIAGNOSIS — E119 Type 2 diabetes mellitus without complications: Secondary | ICD-10-CM | POA: Diagnosis not present

## 2014-05-21 DIAGNOSIS — E669 Obesity, unspecified: Secondary | ICD-10-CM | POA: Diagnosis not present

## 2014-05-21 DIAGNOSIS — N390 Urinary tract infection, site not specified: Secondary | ICD-10-CM | POA: Diagnosis not present

## 2014-05-21 DIAGNOSIS — M549 Dorsalgia, unspecified: Secondary | ICD-10-CM | POA: Diagnosis not present

## 2014-05-21 DIAGNOSIS — E559 Vitamin D deficiency, unspecified: Secondary | ICD-10-CM | POA: Diagnosis not present

## 2014-05-21 DIAGNOSIS — Z309 Encounter for contraceptive management, unspecified: Secondary | ICD-10-CM | POA: Diagnosis not present

## 2014-06-18 DIAGNOSIS — E559 Vitamin D deficiency, unspecified: Secondary | ICD-10-CM | POA: Diagnosis not present

## 2014-06-18 DIAGNOSIS — E119 Type 2 diabetes mellitus without complications: Secondary | ICD-10-CM | POA: Diagnosis not present

## 2014-06-18 DIAGNOSIS — E669 Obesity, unspecified: Secondary | ICD-10-CM | POA: Diagnosis not present

## 2014-06-18 DIAGNOSIS — M549 Dorsalgia, unspecified: Secondary | ICD-10-CM | POA: Diagnosis not present

## 2014-06-18 DIAGNOSIS — Z309 Encounter for contraceptive management, unspecified: Secondary | ICD-10-CM | POA: Diagnosis not present

## 2014-06-18 DIAGNOSIS — J3089 Other allergic rhinitis: Secondary | ICD-10-CM | POA: Diagnosis not present

## 2014-07-20 ENCOUNTER — Ambulatory Visit (INDEPENDENT_AMBULATORY_CARE_PROVIDER_SITE_OTHER): Payer: Medicare Other

## 2014-07-20 ENCOUNTER — Other Ambulatory Visit: Payer: Self-pay | Admitting: Obstetrics & Gynecology

## 2014-07-20 ENCOUNTER — Ambulatory Visit (INDEPENDENT_AMBULATORY_CARE_PROVIDER_SITE_OTHER): Payer: Medicare Other | Admitting: *Deleted

## 2014-07-20 VITALS — BP 127/80 | HR 77 | Temp 97.2°F | Ht 64.0 in | Wt 184.0 lb

## 2014-07-20 DIAGNOSIS — N83209 Unspecified ovarian cyst, unspecified side: Secondary | ICD-10-CM

## 2014-07-20 DIAGNOSIS — N390 Urinary tract infection, site not specified: Secondary | ICD-10-CM | POA: Diagnosis not present

## 2014-07-20 LAB — POCT URINALYSIS DIPSTICK
BILIRUBIN UA: NEGATIVE
Glucose, UA: NEGATIVE
Ketones, UA: NEGATIVE
NITRITE UA: NEGATIVE
PH UA: 5
Protein, UA: NEGATIVE
RBC UA: NEGATIVE
Spec Grav, UA: 1.015
UROBILINOGEN UA: NEGATIVE

## 2014-07-20 MED ORDER — NITROFURANTOIN MONOHYD MACRO 100 MG PO CAPS: 100.0000 mg | ORAL_CAPSULE | Freq: Two times a day (BID) | ORAL | Status: DC

## 2014-07-20 NOTE — Progress Notes (Signed)
Patient in the office today for an ultrasound an possible UTI. Patient states she is having back pain. Patient states she is having frequency and urge to urinate. Patient states she is having a hard time holding her urine. Patient denies any burning with urination.  Urine Culture sent. Per Nursing protocol prescription sent to the pharmacy.   BP 127/80  Pulse 77  Temp(Src) 97.2 F (36.2 C)  Ht 5\' 4"  (1.626 m)  Wt 184 lb (83.462 kg)  BMI 31.57 kg/m2  LMP 06/07/2014

## 2014-07-21 ENCOUNTER — Other Ambulatory Visit: Payer: Medicare Other

## 2014-07-21 ENCOUNTER — Ambulatory Visit (INDEPENDENT_AMBULATORY_CARE_PROVIDER_SITE_OTHER): Payer: Medicare Other | Admitting: Obstetrics

## 2014-07-21 ENCOUNTER — Encounter: Payer: Self-pay | Admitting: Obstetrics & Gynecology

## 2014-07-21 VITALS — BP 143/92 | HR 57 | Temp 98.6°F | Ht 65.5 in | Wt 182.4 lb

## 2014-07-21 DIAGNOSIS — N83209 Unspecified ovarian cyst, unspecified side: Secondary | ICD-10-CM

## 2014-07-22 ENCOUNTER — Telehealth: Payer: Self-pay | Admitting: *Deleted

## 2014-07-22 ENCOUNTER — Encounter: Payer: Self-pay | Admitting: Obstetrics

## 2014-07-22 LAB — URINE CULTURE
Colony Count: NO GROWTH
Organism ID, Bacteria: NO GROWTH

## 2014-07-22 NOTE — Telephone Encounter (Signed)
Patient called regarding her ultrasound results from Tuesday.   CB: 6:06pm, Patient notified that we still have not received the results yet but that Dr. Delsa Sale should receive and review them in the next couple of days and that as soon as she did we would give her a call with the results. Patient voiced understanding.

## 2014-07-22 NOTE — Progress Notes (Signed)
Patient here for follow up of ultrasound done yesterday. Results not available yet.  A/P:  Ultrasound f/u of ovarian cyst.  Final result not available yet.  Will call patient with results.

## 2014-08-27 DIAGNOSIS — J301 Allergic rhinitis due to pollen: Secondary | ICD-10-CM | POA: Diagnosis not present

## 2014-08-27 DIAGNOSIS — E785 Hyperlipidemia, unspecified: Secondary | ICD-10-CM | POA: Diagnosis not present

## 2014-08-27 DIAGNOSIS — E559 Vitamin D deficiency, unspecified: Secondary | ICD-10-CM | POA: Diagnosis not present

## 2014-08-27 DIAGNOSIS — M545 Low back pain: Secondary | ICD-10-CM | POA: Diagnosis not present

## 2014-08-27 DIAGNOSIS — Z23 Encounter for immunization: Secondary | ICD-10-CM | POA: Diagnosis not present

## 2014-08-27 DIAGNOSIS — E119 Type 2 diabetes mellitus without complications: Secondary | ICD-10-CM | POA: Diagnosis not present

## 2014-08-27 DIAGNOSIS — Z79891 Long term (current) use of opiate analgesic: Secondary | ICD-10-CM | POA: Diagnosis not present

## 2014-08-27 DIAGNOSIS — E669 Obesity, unspecified: Secondary | ICD-10-CM | POA: Diagnosis not present

## 2014-08-27 DIAGNOSIS — R5383 Other fatigue: Secondary | ICD-10-CM | POA: Diagnosis not present

## 2014-08-27 DIAGNOSIS — Z5181 Encounter for therapeutic drug level monitoring: Secondary | ICD-10-CM | POA: Diagnosis not present

## 2014-09-06 ENCOUNTER — Encounter: Payer: Self-pay | Admitting: Obstetrics

## 2014-09-21 DIAGNOSIS — R5383 Other fatigue: Secondary | ICD-10-CM | POA: Diagnosis not present

## 2014-09-21 DIAGNOSIS — E559 Vitamin D deficiency, unspecified: Secondary | ICD-10-CM | POA: Diagnosis not present

## 2014-09-21 DIAGNOSIS — H538 Other visual disturbances: Secondary | ICD-10-CM | POA: Diagnosis not present

## 2014-09-21 DIAGNOSIS — E119 Type 2 diabetes mellitus without complications: Secondary | ICD-10-CM | POA: Diagnosis not present

## 2014-09-21 DIAGNOSIS — J301 Allergic rhinitis due to pollen: Secondary | ICD-10-CM | POA: Diagnosis not present

## 2014-09-21 DIAGNOSIS — E669 Obesity, unspecified: Secondary | ICD-10-CM | POA: Diagnosis not present

## 2014-09-21 DIAGNOSIS — M545 Low back pain: Secondary | ICD-10-CM | POA: Diagnosis not present

## 2014-09-22 DIAGNOSIS — E119 Type 2 diabetes mellitus without complications: Secondary | ICD-10-CM | POA: Diagnosis not present

## 2014-10-15 DIAGNOSIS — E119 Type 2 diabetes mellitus without complications: Secondary | ICD-10-CM | POA: Diagnosis not present

## 2014-10-15 DIAGNOSIS — M545 Low back pain: Secondary | ICD-10-CM | POA: Diagnosis not present

## 2014-10-15 DIAGNOSIS — R5383 Other fatigue: Secondary | ICD-10-CM | POA: Diagnosis not present

## 2014-10-15 DIAGNOSIS — Z23 Encounter for immunization: Secondary | ICD-10-CM | POA: Diagnosis not present

## 2014-10-15 DIAGNOSIS — E559 Vitamin D deficiency, unspecified: Secondary | ICD-10-CM | POA: Diagnosis not present

## 2014-10-15 DIAGNOSIS — E669 Obesity, unspecified: Secondary | ICD-10-CM | POA: Diagnosis not present

## 2014-10-15 DIAGNOSIS — J301 Allergic rhinitis due to pollen: Secondary | ICD-10-CM | POA: Diagnosis not present

## 2014-11-01 ENCOUNTER — Encounter: Payer: Self-pay | Admitting: *Deleted

## 2014-11-02 ENCOUNTER — Encounter: Payer: Self-pay | Admitting: Obstetrics & Gynecology

## 2014-11-02 ENCOUNTER — Telehealth: Payer: Self-pay | Admitting: *Deleted

## 2014-11-02 NOTE — Telephone Encounter (Signed)
Pt called to office requesting refill on birth control.   Placed call to pt.  LM on VM making her aware that refill for 3 months would be sent.  Pt made aware that she will need to make an appt for an AEX in order to continue refills.

## 2014-11-30 ENCOUNTER — Ambulatory Visit (INDEPENDENT_AMBULATORY_CARE_PROVIDER_SITE_OTHER): Payer: Medicare Other | Admitting: Obstetrics

## 2014-11-30 ENCOUNTER — Encounter: Payer: Self-pay | Admitting: Obstetrics

## 2014-11-30 VITALS — BP 120/73 | HR 107 | Temp 98.3°F | Ht 64.0 in | Wt 192.0 lb

## 2014-11-30 DIAGNOSIS — B373 Candidiasis of vulva and vagina: Secondary | ICD-10-CM

## 2014-11-30 DIAGNOSIS — N758 Other diseases of Bartholin's gland: Secondary | ICD-10-CM

## 2014-11-30 DIAGNOSIS — Z01419 Encounter for gynecological examination (general) (routine) without abnormal findings: Secondary | ICD-10-CM

## 2014-11-30 DIAGNOSIS — Z124 Encounter for screening for malignant neoplasm of cervix: Secondary | ICD-10-CM | POA: Diagnosis not present

## 2014-11-30 DIAGNOSIS — B3731 Acute candidiasis of vulva and vagina: Secondary | ICD-10-CM

## 2014-11-30 MED ORDER — FLUCONAZOLE 150 MG PO TABS: 150.0000 mg | ORAL_TABLET | Freq: Once | ORAL | Status: DC

## 2014-11-30 MED ORDER — CLINDAMYCIN HCL 300 MG PO CAPS: 300.0000 mg | ORAL_CAPSULE | Freq: Three times a day (TID) | ORAL | Status: DC

## 2014-12-02 LAB — PAP IG (IMAGE GUIDED)

## 2014-12-06 ENCOUNTER — Encounter: Payer: Self-pay | Admitting: Obstetrics

## 2014-12-06 NOTE — Progress Notes (Signed)
Subjective:        Vicki Mccoy is a 34 y.o. female here for a routine exam.  Current complaints: Pelvic pain..    Personal health questionnaire:  Is patient Ashkenazi Jewish, have a family history of breast and/or ovarian cancer: no Is there a family history of uterine cancer diagnosed at age < 29, gastrointestinal cancer, urinary tract cancer, family member who is a Field seismologist syndrome-associated carrier: no Is the patient overweight and hypertensive, family history of diabetes, personal history of gestational diabetes, preeclampsia or PCOS: no Is patient over 22, have PCOS,  family history of premature CHD under age 57, diabetes, smoke, have hypertension or peripheral artery disease:  no At any time, has a partner hit, kicked or otherwise hurt or frightened you?: no Over the past 2 weeks, have you felt down, depressed or hopeless?: no Over the past 2 weeks, have you felt little interest or pleasure in doing things?:no   Gynecologic History Patient's last menstrual period was 11/24/2014. Contraception: OCP (estrogen/progesterone) Last Pap: 2014. Results were: normal Last mammogram: n/a. Results were: n/a  Obstetric History OB History  Gravida Para Term Preterm AB SAB TAB Ectopic Multiple Living  2 2 1 1  0 0    1    # Outcome Date GA Lbr Len/2nd Weight Sex Delivery Anes PTL Lv  2 Preterm 2008     Vag-Spont        Comments: 23 weeks  1 Term 2007     Vag-Spont         Past Medical History  Diagnosis Date  . Diabetes mellitus   . Sickle cell trait   . Arthritis   . Allergy     History reviewed. No pertinent past surgical history.   Current outpatient prescriptions:  .  cholecalciferol (VITAMIN D) 1000 UNITS tablet, Take 1,000 Units by mouth daily., Disp: , Rfl:  .  metFORMIN (GLUCOPHAGE) 500 MG tablet, Take 500 mg by mouth daily with breakfast., Disp: , Rfl:  .  naproxen (NAPROSYN) 250 MG tablet, Take by mouth 2 (two) times daily with a meal., Disp: , Rfl:  .   Norgestimate-Ethinyl Estradiol Triphasic (TRINESSA, 28,) 0.18/0.215/0.25 MG-35 MCG tablet, Take 1 tablet by mouth daily., Disp: 28 tablet, Rfl: 11 .  clindamycin (CLEOCIN) 300 MG capsule, Take 1 capsule (300 mg total) by mouth 3 (three) times daily., Disp: 30 capsule, Rfl: 1 .  fluconazole (DIFLUCAN) 150 MG tablet, Take 1 tablet (150 mg total) by mouth once., Disp: 1 tablet, Rfl: 2 No Known Allergies  History  Substance Use Topics  . Smoking status: Never Smoker   . Smokeless tobacco: Never Used  . Alcohol Use: No    Family History  Problem Relation Age of Onset  . Sickle cell anemia Mother   . Cerebral palsy Daughter       Review of Systems  Constitutional: negative for fatigue and weight loss Respiratory: negative for cough and wheezing Cardiovascular: negative for chest pain, fatigue and palpitations Gastrointestinal: negative for abdominal pain and change in bowel habits Musculoskeletal:negative for myalgias Neurological: negative for gait problems and tremors Behavioral/Psych: negative for abusive relationship, depression Endocrine: negative for temperature intolerance   Genitourinary:negative for abnormal menstrual periods, genital lesions, hot flashes, sexual problems and vaginal discharge Integument/breast: negative for breast lump, breast tenderness, nipple discharge and skin lesion(s)    Objective:       BP 120/73 mmHg  Pulse 107  Temp(Src) 98.3 F (36.8 C)  Ht 5\' 4"  (1.626  m)  Wt 192 lb (87.091 kg)  BMI 32.94 kg/m2  LMP 11/24/2014 General:   alert  Skin:   no rash or abnormalities  Lungs:   clear to auscultation bilaterally  Heart:   regular rate and rhythm, S1, S2 normal, no murmur, click, rub or gallop  Breasts:   normal without suspicious masses, skin or nipple changes or axillary nodes  Abdomen:  normal findings: no organomegaly, soft, non-tender and no hernia  Pelvis:  Right Bartholin's tender and swollen. Urinary system: urethral meatus normal and  bladder without fullness, nontender Vaginal: normal without tenderness, induration or masses Cervix: normal appearance Adnexa: normal bimanual exam Uterus: anteverted and non-tender, normal size   Lab Review Urine pregnancy test Labs reviewed yes Radiologic studies reviewed yes    Assessment:    Healthy female exam.   Bartholin's gland infection   Plan:   Clindamycin Rx  Education reviewed: low fat, low cholesterol diet and safe sex/STD prevention. Contraception: OCP (estrogen/progesterone). Follow up in:3 months.   Meds ordered this encounter  Medications  . clindamycin (CLEOCIN) 300 MG capsule    Sig: Take 1 capsule (300 mg total) by mouth 3 (three) times daily.    Dispense:  30 capsule    Refill:  1  . fluconazole (DIFLUCAN) 150 MG tablet    Sig: Take 1 tablet (150 mg total) by mouth once.    Dispense:  1 tablet    Refill:  2   No orders of the defined types were placed in this encounter.

## 2014-12-15 ENCOUNTER — Ambulatory Visit: Payer: Medicare Other | Admitting: Obstetrics

## 2015-01-04 ENCOUNTER — Ambulatory Visit: Payer: Medicare Other | Admitting: Obstetrics

## 2015-01-19 ENCOUNTER — Encounter: Payer: Self-pay | Admitting: Obstetrics

## 2015-01-19 ENCOUNTER — Ambulatory Visit (INDEPENDENT_AMBULATORY_CARE_PROVIDER_SITE_OTHER): Payer: Medicare Other | Admitting: Obstetrics

## 2015-01-19 VITALS — BP 122/80 | HR 106 | Temp 97.8°F | Ht 65.0 in | Wt 193.0 lb

## 2015-01-19 DIAGNOSIS — N760 Acute vaginitis: Secondary | ICD-10-CM | POA: Diagnosis not present

## 2015-01-19 DIAGNOSIS — N758 Other diseases of Bartholin's gland: Secondary | ICD-10-CM | POA: Diagnosis not present

## 2015-01-20 ENCOUNTER — Encounter: Payer: Self-pay | Admitting: Obstetrics

## 2015-01-20 NOTE — Progress Notes (Signed)
Patient ID: Vicki Mccoy, female   DOB: Jul 23, 1981, 34 y.o.   MRN: [id number]  Chief Complaint  Patient presents with  . Follow-up    Bartholin's gland infection     HPI Vicki Mccoy is a 34 y.o. female.  Presents for F/U of Bartholin's abscess.  No complaints.  HPI  Past Medical History  Diagnosis Date  . Diabetes mellitus   . Sickle cell trait   . Arthritis   . Allergy     History reviewed. No pertinent past surgical history.  Family History  Problem Relation Age of Onset  . Sickle cell anemia Mother   . Cerebral palsy Daughter     Social History History  Substance Use Topics  . Smoking status: Never Smoker   . Smokeless tobacco: Never Used  . Alcohol Use: No    No Known Allergies  Current Outpatient Prescriptions  Medication Sig Dispense Refill  . Cholecalciferol (VITAMIN D-3 PO) Take by mouth.    . metFORMIN (GLUCOPHAGE) 500 MG tablet Take 500 mg by mouth daily with breakfast.    . Norgestimate-Ethinyl Estradiol Triphasic (TRINESSA, 28,) 0.18/0.215/0.25 MG-35 MCG tablet Take 1 tablet by mouth daily. 28 tablet 11  . clindamycin (CLEOCIN) 300 MG capsule Take 1 capsule (300 mg total) by mouth 3 (three) times daily. (Patient not taking: Reported on 01/19/2015) 30 capsule 1  . fluconazole (DIFLUCAN) 150 MG tablet Take 1 tablet (150 mg total) by mouth once. (Patient not taking: Reported on 01/19/2015) 1 tablet 2  . naproxen (NAPROSYN) 250 MG tablet Take by mouth 2 (two) times daily with a meal.     No current facility-administered medications for this visit.    Review of Systems Review of Systems Constitutional: negative for fatigue and weight loss Respiratory: negative for cough and wheezing Cardiovascular: negative for chest pain, fatigue and palpitations Gastrointestinal: negative for abdominal pain and change in bowel habits Genitourinary:negative Integument/breast: negative for nipple discharge Musculoskeletal:negative for myalgias Neurological: negative  for gait problems and tremors Behavioral/Psych: negative for abusive relationship, depression Endocrine: negative for temperature intolerance     Blood pressure 122/80, pulse 106, temperature 97.8 F (36.6 C), height 5\' 5"  (1.651 m), weight 193 lb (87.544 kg), last menstrual period 12/28/2014.  Physical Exam Physical Exam                Abdomen:  normal findings: no organomegaly, soft, non-tender and no hernia  Pelvis:  External genitalia: normal general appearance Urinary system: urethral meatus normal and bladder without fullness, nontender Vaginal: normal without tenderness, induration or masses Cervix: normal appearance Adnexa: normal bimanual exam Uterus: anteverted and non-tender, normal size      Data Reviewed Labs  Assessment     Bartholin's Abscess.  Resolved after p.o. Clindamycin.    Plan    F/U prn  Orders Placed This Encounter  Procedures  . SureSwab Bacterial Vaginosis/itis   Meds ordered this encounter  Medications  . Cholecalciferol (VITAMIN D-3 PO)    Sig: Take by mouth.

## 2015-01-23 LAB — SURESWAB BACTERIAL VAGINOSIS/ITIS
ATOPOBIUM VAGINAE: NOT DETECTED Log (cells/mL)
C. ALBICANS, DNA: NOT DETECTED
C. GLABRATA, DNA: NOT DETECTED
C. TROPICALIS, DNA: NOT DETECTED
C. parapsilosis, DNA: NOT DETECTED
GARDNERELLA VAGINALIS: NOT DETECTED Log (cells/mL)
LACTOBACILLUS SPECIES: NOT DETECTED Log (cells/mL)
MEGASPHAERA SPECIES: NOT DETECTED Log (cells/mL)
T. vaginalis RNA, QL TMA: NOT DETECTED

## 2015-02-07 DIAGNOSIS — H1131 Conjunctival hemorrhage, right eye: Secondary | ICD-10-CM | POA: Diagnosis not present

## 2015-02-07 DIAGNOSIS — H5213 Myopia, bilateral: Secondary | ICD-10-CM | POA: Diagnosis not present

## 2015-02-07 DIAGNOSIS — E119 Type 2 diabetes mellitus without complications: Secondary | ICD-10-CM | POA: Diagnosis not present

## 2015-02-07 DIAGNOSIS — H52223 Regular astigmatism, bilateral: Secondary | ICD-10-CM | POA: Diagnosis not present

## 2015-02-07 DIAGNOSIS — H35373 Puckering of macula, bilateral: Secondary | ICD-10-CM | POA: Diagnosis not present

## 2015-03-02 DIAGNOSIS — E559 Vitamin D deficiency, unspecified: Secondary | ICD-10-CM | POA: Diagnosis not present

## 2015-03-02 DIAGNOSIS — E785 Hyperlipidemia, unspecified: Secondary | ICD-10-CM | POA: Diagnosis not present

## 2015-03-02 DIAGNOSIS — E119 Type 2 diabetes mellitus without complications: Secondary | ICD-10-CM | POA: Diagnosis not present

## 2015-03-02 DIAGNOSIS — Z9189 Other specified personal risk factors, not elsewhere classified: Secondary | ICD-10-CM | POA: Diagnosis not present

## 2015-03-02 DIAGNOSIS — M545 Low back pain: Secondary | ICD-10-CM | POA: Diagnosis not present

## 2015-03-02 DIAGNOSIS — E669 Obesity, unspecified: Secondary | ICD-10-CM | POA: Diagnosis not present

## 2015-03-02 DIAGNOSIS — J301 Allergic rhinitis due to pollen: Secondary | ICD-10-CM | POA: Diagnosis not present

## 2015-03-09 DIAGNOSIS — E1151 Type 2 diabetes mellitus with diabetic peripheral angiopathy without gangrene: Secondary | ICD-10-CM | POA: Diagnosis not present

## 2015-03-09 DIAGNOSIS — G4733 Obstructive sleep apnea (adult) (pediatric): Secondary | ICD-10-CM | POA: Diagnosis not present

## 2015-03-09 DIAGNOSIS — R0602 Shortness of breath: Secondary | ICD-10-CM | POA: Diagnosis not present

## 2015-03-29 DIAGNOSIS — J301 Allergic rhinitis due to pollen: Secondary | ICD-10-CM | POA: Diagnosis not present

## 2015-03-29 DIAGNOSIS — E119 Type 2 diabetes mellitus without complications: Secondary | ICD-10-CM | POA: Diagnosis not present

## 2015-03-29 DIAGNOSIS — E785 Hyperlipidemia, unspecified: Secondary | ICD-10-CM | POA: Diagnosis not present

## 2015-03-29 DIAGNOSIS — E669 Obesity, unspecified: Secondary | ICD-10-CM | POA: Diagnosis not present

## 2015-03-29 DIAGNOSIS — M545 Low back pain: Secondary | ICD-10-CM | POA: Diagnosis not present

## 2015-03-29 DIAGNOSIS — E559 Vitamin D deficiency, unspecified: Secondary | ICD-10-CM | POA: Diagnosis not present

## 2015-07-20 DIAGNOSIS — J301 Allergic rhinitis due to pollen: Secondary | ICD-10-CM | POA: Diagnosis not present

## 2015-07-20 DIAGNOSIS — E669 Obesity, unspecified: Secondary | ICD-10-CM | POA: Diagnosis not present

## 2015-07-20 DIAGNOSIS — M545 Low back pain: Secondary | ICD-10-CM | POA: Diagnosis not present

## 2015-07-20 DIAGNOSIS — E119 Type 2 diabetes mellitus without complications: Secondary | ICD-10-CM | POA: Diagnosis not present

## 2015-07-20 DIAGNOSIS — E559 Vitamin D deficiency, unspecified: Secondary | ICD-10-CM | POA: Diagnosis not present

## 2015-07-20 DIAGNOSIS — E785 Hyperlipidemia, unspecified: Secondary | ICD-10-CM | POA: Diagnosis not present

## 2015-07-26 DIAGNOSIS — E119 Type 2 diabetes mellitus without complications: Secondary | ICD-10-CM | POA: Diagnosis not present

## 2015-07-26 DIAGNOSIS — E559 Vitamin D deficiency, unspecified: Secondary | ICD-10-CM | POA: Diagnosis not present

## 2015-07-26 DIAGNOSIS — E669 Obesity, unspecified: Secondary | ICD-10-CM | POA: Diagnosis not present

## 2015-07-26 DIAGNOSIS — J301 Allergic rhinitis due to pollen: Secondary | ICD-10-CM | POA: Diagnosis not present

## 2015-07-26 DIAGNOSIS — Z23 Encounter for immunization: Secondary | ICD-10-CM | POA: Diagnosis not present

## 2015-07-26 DIAGNOSIS — E785 Hyperlipidemia, unspecified: Secondary | ICD-10-CM | POA: Diagnosis not present

## 2015-07-26 DIAGNOSIS — M545 Low back pain: Secondary | ICD-10-CM | POA: Diagnosis not present

## 2015-08-22 ENCOUNTER — Ambulatory Visit: Payer: Medicare Other | Admitting: Pediatrics

## 2015-08-31 DIAGNOSIS — E785 Hyperlipidemia, unspecified: Secondary | ICD-10-CM | POA: Diagnosis not present

## 2015-08-31 DIAGNOSIS — E669 Obesity, unspecified: Secondary | ICD-10-CM | POA: Diagnosis not present

## 2015-08-31 DIAGNOSIS — J301 Allergic rhinitis due to pollen: Secondary | ICD-10-CM | POA: Diagnosis not present

## 2015-08-31 DIAGNOSIS — Z23 Encounter for immunization: Secondary | ICD-10-CM | POA: Diagnosis not present

## 2015-08-31 DIAGNOSIS — E119 Type 2 diabetes mellitus without complications: Secondary | ICD-10-CM | POA: Diagnosis not present

## 2015-08-31 DIAGNOSIS — M545 Low back pain: Secondary | ICD-10-CM | POA: Diagnosis not present

## 2015-08-31 DIAGNOSIS — E559 Vitamin D deficiency, unspecified: Secondary | ICD-10-CM | POA: Diagnosis not present

## 2015-09-15 DIAGNOSIS — E1159 Type 2 diabetes mellitus with other circulatory complications: Secondary | ICD-10-CM | POA: Diagnosis not present

## 2015-09-15 DIAGNOSIS — E119 Type 2 diabetes mellitus without complications: Secondary | ICD-10-CM | POA: Diagnosis not present

## 2015-09-15 DIAGNOSIS — H538 Other visual disturbances: Secondary | ICD-10-CM | POA: Diagnosis not present

## 2015-09-19 ENCOUNTER — Ambulatory Visit (INDEPENDENT_AMBULATORY_CARE_PROVIDER_SITE_OTHER): Payer: Medicare Other | Admitting: Allergy and Immunology

## 2015-09-19 ENCOUNTER — Encounter: Payer: Self-pay | Admitting: Allergy and Immunology

## 2015-09-19 VITALS — BP 116/80 | HR 92 | Temp 98.7°F | Resp 16 | Ht 60.24 in | Wt 212.5 lb

## 2015-09-19 DIAGNOSIS — J310 Chronic rhinitis: Secondary | ICD-10-CM | POA: Diagnosis not present

## 2015-09-19 DIAGNOSIS — R05 Cough: Secondary | ICD-10-CM | POA: Diagnosis not present

## 2015-09-19 DIAGNOSIS — R059 Cough, unspecified: Secondary | ICD-10-CM

## 2015-09-19 NOTE — Assessment & Plan Note (Addendum)
Non-allergic rhinitis.  All seasonal and perennial aeroallergen skin tests are negative despite a positive histamine control.  Intranasal steroids and intranasal antihistamines are effective for symptoms associated with non-allergic rhinitis, whereas second generation antihistamines such as cetirizine, loratadine and fexofenadine have been found to be ineffective for this condition.  A prescription has been provided for azelastine nasal spray, 1-2 sprays per nostril 2 times daily as needed. Proper nasal spray technique has been discussed and demonstrated.   In addition to azelastine, the patient has been encouraged to restart fluticasone nasal spray, 2 sprays per nostril daily.  Nasal saline lavage as needed has been recommended along with instructions for proper administration.  Guaifenesin 1200 mg (plus/minus pseudoephedrine 120 mg) twice daily as needed with adequate hydration. Pseudoephedrine is only to be used for short-term relief of nasal/sinus congestion. Long-term use is discouraged due to potential side effects.

## 2015-09-19 NOTE — Progress Notes (Signed)
History of present illness: HPI Comments: Vicki Mccoy is a 34 y.o. female who presents today for consultation of rhinitis and cough. She experiences persistent rhinorrhea, severe nasal congestion, sinus pressure, postnasal drainage, and coughing. No significant seasonal symptom variation has been noted nor have specific environmental triggers been identified.  She has been experiencing these symptoms for many years, however the symptoms have been progressive over the past year. She believes that the cough may be due to irritation from thick post nasal drainage. This past spring she was prescribed montelukast and fluticasone nasal spray, however she has not received adequate symptom relief from either.  She discontinued fluticasone nasal spray a few months ago.   Assessment and plan:  Chronic rhinitis Non-allergic rhinitis.  All seasonal and perennial aeroallergen skin tests are negative despite a positive histamine control.  Intranasal steroids and intranasal antihistamines are effective for symptoms associated with non-allergic rhinitis, whereas second generation antihistamines such as cetirizine, loratadine and fexofenadine have been found to be ineffective for this condition.  A prescription has been provided for azelastine nasal spray, 1-2 sprays per nostril 2 times daily as needed. Proper nasal spray technique has been discussed and demonstrated.   In addition to azelastine, the patient has been instructed to use fluticasone nasal spray, 2 sprays per nostril daily.  Nasal saline lavage as needed has been recommended along with instructions for proper administration.  Guaifenesin 1200 mg (plus/minus pseudoephedrine 120 mg) twice daily as needed with adequate hydration. Pseudoephedrine is only to be used for short-term relief of nasal/sinus congestion. Long-term use is discouraged due to potential side effects.   Coughing The patient's history and physical examination suggest upper airway  cough syndrome.    Treatment plan as outlined above.  If the coughing persists or progresses despite this plan, we will evaluate further.   Medications ordered this encounter: No orders of the defined types were placed in this encounter.    Diagnositics: Allergy skin testing: Negative despite a positive histamine control.    Physical examination: Blood pressure 116/80, pulse 92, temperature 98.7 F (37.1 C), resp. rate 16, height 5' 0.24" (1.53 m), weight 212 lb 8.4 oz (96.4 kg).  General: Alert, interactive, in no acute distress. HEENT: TMs pearly gray, turbinates markedly edematous with crusty discharge, post-pharynx erythematous. Neck: Supple without lymphadenopathy. Lungs: Clear to auscultation without wheezing, rhonchi or rales. CV: Normal S1, S2 without murmurs. Abdomen: Nondistended, nontender. Skin: Warm and dry, without lesions or rashes. Extremities:  No clubbing, cyanosis or edema. Neuro:   Grossly intact.  Review of systems: Review of Systems  Constitutional: Negative for fever, chills and weight loss.  HENT: Negative for nosebleeds.   Eyes: Negative for blurred vision.  Respiratory: Negative for hemoptysis.   Cardiovascular: Negative for chest pain.  Gastrointestinal: Negative for diarrhea and constipation.  Genitourinary: Negative for dysuria.  Musculoskeletal: Negative for myalgias and joint pain.  Neurological: Negative for dizziness.  Endo/Heme/Allergies: Does not bruise/bleed easily.    Past medical history: Past Medical History  Diagnosis Date  . Diabetes mellitus   . Sickle cell trait ([place])   . Arthritis   . Allergy     Past surgical history: No past surgical history on file.  Family history: Family History  Problem Relation Age of Onset  . Sickle cell anemia Mother   . Cerebral palsy Daughter   . Allergic rhinitis Neg Hx   . Angioedema Neg Hx   . Asthma Neg Hx   . Eczema Neg Hx   . Urticaria  Neg Hx   . Immunodeficiency Neg Hx      Social history: Social History   Social History  . Marital Status: Single    Spouse Name: n/a  . Number of Children: 1  . Years of Education: 12   Occupational History  . [employer] Unemployed    housekeeping   Social History Main Topics  . Smoking status: Never Smoker   . Smokeless tobacco: Never Used  . Alcohol Use: No  . Drug Use: No  . Sexual Activity:    Partners: Male    Birth Control/ Protection: Pill   Other Topics Concern  . Not on file   Social History Narrative   Lives with her daughter.  Family is nearby.   Environmental History: The patient lives in a 36 year old house with hardwood floors throughout, window air conditioning units, and gas heat.  She is a nonsmoker without pets inside the house.  Known medication allergies: No Known Allergies  Outpatient medications:   Medication List       This list is accurate as of: 09/19/15 10:15 PM.  Always use your most recent med list.               clindamycin 300 MG capsule  Commonly known as:  CLEOCIN  Take 1 capsule (300 mg total) by mouth 3 (three) times daily.     fluconazole 150 MG tablet  Commonly known as:  DIFLUCAN  Take 1 tablet (150 mg total) by mouth once.     fluticasone 50 MCG/ACT nasal spray  Commonly known as:  FLONASE  Place 2 sprays into both nostrils daily.     metFORMIN 500 MG tablet  Commonly known as:  GLUCOPHAGE  Take 500 mg by mouth daily with breakfast.     montelukast 10 MG tablet  Commonly known as:  SINGULAIR  Take 10 mg by mouth every evening.     naproxen 250 MG tablet  Commonly known as:  NAPROSYN  Take by mouth 2 (two) times daily with a meal.     Norgestimate-Ethinyl Estradiol Triphasic 0.18/0.215/0.25 MG-35 MCG tablet  Commonly known as:  TRINESSA (28)  Take 1 tablet by mouth daily.     VITAMIN D-3 PO  Take by mouth.        I appreciate the opportunity to take part in this Ricardo's care. Please do not hesitate to contact me with  questions.  Sincerely,   R. Edgar Frisk, MD

## 2015-09-19 NOTE — Patient Instructions (Addendum)
  Chronic rhinitis Non-allergic rhinitis.  All seasonal and perennial aeroallergen skin tests are negative despite a positive histamine control.  Intranasal steroids and intranasal antihistamines are effective for symptoms associated with non-allergic rhinitis, whereas second generation antihistamines such as cetirizine, loratadine and fexofenadine have been found to be ineffective for this condition.  A prescription has been provided for azelastine nasal spray, 1-2 sprays per nostril 2 times daily as needed. Proper nasal spray technique has been discussed and demonstrated.   In addition to azelastine, the patient has been instructed to use fluticasone nasal spray, 2 sprays per nostril daily.  Nasal saline lavage as needed has been recommended along with instructions for proper administration.  Guaifenesin 1200 mg (plus/minus pseudoephedrine 120 mg) twice daily as needed with adequate hydration. Pseudoephedrine is only to be used for short-term relief of nasal/sinus congestion. Long-term use is discouraged due to potential side effects.   Coughing The patient's history and physical examination suggest upper airway cough syndrome.    Treatment plan as outlined above.  If the coughing persists or progresses despite this plan, we will evaluate further.  Return in about 3 months (around 12/20/2015), or if symptoms worsen or fail to improve.

## 2015-09-19 NOTE — Assessment & Plan Note (Addendum)
The patient's history and physical examination suggest upper airway cough syndrome.    Treatment plan as outlined above.  If the coughing persists or progresses despite this plan, we will evaluate further. 

## 2015-09-22 DIAGNOSIS — E669 Obesity, unspecified: Secondary | ICD-10-CM | POA: Diagnosis not present

## 2015-09-22 DIAGNOSIS — Z0100 Encounter for examination of eyes and vision without abnormal findings: Secondary | ICD-10-CM | POA: Diagnosis not present

## 2015-09-22 DIAGNOSIS — J209 Acute bronchitis, unspecified: Secondary | ICD-10-CM | POA: Diagnosis not present

## 2015-09-22 DIAGNOSIS — E785 Hyperlipidemia, unspecified: Secondary | ICD-10-CM | POA: Diagnosis not present

## 2015-09-22 DIAGNOSIS — Z0000 Encounter for general adult medical examination without abnormal findings: Secondary | ICD-10-CM | POA: Diagnosis not present

## 2015-09-22 DIAGNOSIS — E119 Type 2 diabetes mellitus without complications: Secondary | ICD-10-CM | POA: Diagnosis not present

## 2015-09-22 DIAGNOSIS — Z1389 Encounter for screening for other disorder: Secondary | ICD-10-CM | POA: Diagnosis not present

## 2015-09-22 DIAGNOSIS — Z01118 Encounter for examination of ears and hearing with other abnormal findings: Secondary | ICD-10-CM | POA: Diagnosis not present

## 2015-10-05 ENCOUNTER — Ambulatory Visit: Payer: Medicare Other | Admitting: Allergy and Immunology

## 2015-10-09 ENCOUNTER — Encounter (HOSPITAL_COMMUNITY): Payer: Self-pay | Admitting: Emergency Medicine

## 2015-10-09 ENCOUNTER — Emergency Department (HOSPITAL_COMMUNITY)
Admission: EM | Admit: 2015-10-09 | Discharge: 2015-10-09 | Disposition: A | Discharge status: Home / Self Care | Payer: Medicare Other | Attending: Emergency Medicine | Admitting: Emergency Medicine

## 2015-10-09 DIAGNOSIS — Z791 Long term (current) use of non-steroidal anti-inflammatories (NSAID): Secondary | ICD-10-CM | POA: Diagnosis not present

## 2015-10-09 DIAGNOSIS — Z7984 Long term (current) use of oral hypoglycemic drugs: Secondary | ICD-10-CM | POA: Diagnosis not present

## 2015-10-09 DIAGNOSIS — E119 Type 2 diabetes mellitus without complications: Secondary | ICD-10-CM | POA: Insufficient documentation

## 2015-10-09 DIAGNOSIS — Z862 Personal history of diseases of the blood and blood-forming organs and certain disorders involving the immune mechanism: Secondary | ICD-10-CM | POA: Diagnosis not present

## 2015-10-09 DIAGNOSIS — Z79899 Other long term (current) drug therapy: Secondary | ICD-10-CM | POA: Diagnosis not present

## 2015-10-09 DIAGNOSIS — M1990 Unspecified osteoarthritis, unspecified site: Secondary | ICD-10-CM | POA: Diagnosis not present

## 2015-10-09 DIAGNOSIS — R05 Cough: Secondary | ICD-10-CM | POA: Diagnosis present

## 2015-10-09 DIAGNOSIS — Z7951 Long term (current) use of inhaled steroids: Secondary | ICD-10-CM | POA: Diagnosis not present

## 2015-10-09 DIAGNOSIS — R0981 Nasal congestion: Secondary | ICD-10-CM | POA: Diagnosis not present

## 2015-10-09 MED ORDER — PSEUDOEPHEDRINE HCL 60 MG PO TABS: 60.0000 mg | ORAL_TABLET | ORAL | Status: DC | PRN

## 2015-10-09 MED ORDER — FLUTICASONE PROPIONATE 50 MCG/ACT NA SUSP: 2.0000 | Freq: Every day | NASAL | Status: DC

## 2015-10-09 NOTE — ED Notes (Signed)
Declined W/C at D/C and was escorted to lobby by RN. 

## 2015-10-09 NOTE — ED Provider Notes (Signed)
CSN: [id number]     Arrival date & time 10/09/15  1419 History  By signing my name below, I, Vicki Mccoy, attest that this documentation has been prepared under the direction and in the presence of [name], PA-C. Electronically Signed: Starleen Mccoy ED Scribe. 10/09/2015. 3:35 PM.    Chief Complaint  Patient presents with  . Nasal Congestion  . Cough   The history is provided by the patient. No language interpreter was used.   HPI Comments: [place] Vicki Mccoy is a 34 y.o. female  who presents to the Emergency Department complaining of waxing and waning, persistent nasal congestion onset 2 months ago.  Associated symptoms include a cough productive of green sputum and chills. She has used OTC allergy medication, OTC decongestant, and saline nasal spray with transient relief. She is not using Afrin. She was seen by her PCP one month ago where she was prescribed antibiotics which she took without change.  She has not seen an ENT for this complaint.  She denies hx of seasonal allergies.  She denies fever, wheeze.    Past Medical History  Diagnosis Date  . Diabetes mellitus   . Sickle cell trait ([place])   . Arthritis   . Allergy    History reviewed. No pertinent past surgical history. Family History  Problem Relation Age of Onset  . Sickle cell anemia Mother   . Cerebral palsy Daughter   . Allergic rhinitis Neg Hx   . Angioedema Neg Hx   . Asthma Neg Hx   . Eczema Neg Hx   . Urticaria Neg Hx   . Immunodeficiency Neg Hx    Social History  Substance Use Topics  . Smoking status: Never Smoker   . Smokeless tobacco: Never Used  . Alcohol Use: No   OB History    Gravida Para Term Preterm AB TAB SAB Ectopic Multiple Living   2 2 1 1  0  0   1     Review of Systems  Constitutional: Positive for chills. Negative for fever and fatigue.  HENT: Positive for congestion, rhinorrhea and sinus pressure. Negative for ear pain and sore throat.   Eyes: Negative for redness.  Respiratory:  Negative for cough and wheezing.   Gastrointestinal: Negative for nausea, vomiting, abdominal pain and diarrhea.  Genitourinary: Negative for dysuria.  Musculoskeletal: Negative for myalgias and neck stiffness.  Skin: Negative for rash.  Neurological: Negative for headaches.  Hematological: Negative for adenopathy.      Allergies  Review of patient's allergies indicates no known allergies.  Home Medications   Prior to Admission medications   Medication Sig Start Date End Date Taking? Authorizing Provider  Cholecalciferol (VITAMIN D-3 PO) Take by mouth.    Historical Provider, MD  clindamycin (CLEOCIN) 300 MG capsule Take 1 capsule (300 mg total) by mouth 3 (three) times daily. Patient not taking: Reported on 01/19/2015 11/30/14   [name], MD  fluconazole (DIFLUCAN) 150 MG tablet Take 1 tablet (150 mg total) by mouth once. Patient not taking: Reported on 09/19/2015 11/30/14   [name], MD  fluticasone [facility]) 50 MCG/ACT nasal spray Place 2 sprays into both nostrils daily.    Historical Provider, MD  metFORMIN (GLUCOPHAGE) 500 MG tablet Take 500 mg by mouth daily with breakfast.    Historical Provider, MD  montelukast (SINGULAIR) 10 MG tablet Take 10 mg by mouth every evening.    Historical Provider, MD  naproxen (NAPROSYN) 250 MG tablet Take by mouth 2 (two) times  daily with a meal.    Historical Provider, MD  Norgestimate-Ethinyl Estradiol Triphasic (TRINESSA, 28,) 0.18/0.215/0.25 MG-35 MCG tablet Take 1 tablet by mouth daily. 10/06/13   [name], MD   BP 137/80 mmHg  Pulse 87  Temp(Src) 97.7 F (36.5 C) (Oral)  Resp 20  SpO2 100%  LMP 10/08/2015 Physical Exam  Constitutional: She is oriented to person, place, and time. She appears well-developed and well-nourished. No distress.  HENT:  Head: Normocephalic and atraumatic.  Right Ear: Tympanic membrane, external ear and ear canal normal.  Left Ear: Tympanic membrane, external ear and ear canal normal.   Nose: Mucosal edema and rhinorrhea present. No epistaxis. Right sinus exhibits maxillary sinus tenderness. Right sinus exhibits no frontal sinus tenderness. Left sinus exhibits maxillary sinus tenderness. Left sinus exhibits no frontal sinus tenderness.  Mouth/Throat: Uvula is midline, oropharynx is clear and moist and mucous membranes are normal. Mucous membranes are not dry. No oral lesions. No trismus in the jaw. No uvula swelling. No oropharyngeal exudate, posterior oropharyngeal edema, posterior oropharyngeal erythema or tonsillar abscesses.  Eyes: Conjunctivae and EOM are normal. Right eye exhibits no discharge. Left eye exhibits no discharge.  Neck: Normal range of motion. Neck supple. No tracheal deviation present.  Cardiovascular: Normal rate, regular rhythm and normal heart sounds.   Pulmonary/Chest: Effort normal and breath sounds normal. No respiratory distress. She has no wheezes. She has no rales.  Abdominal: Soft. There is no tenderness.  Musculoskeletal: Normal range of motion.  Lymphadenopathy:    She has no cervical adenopathy.  Neurological: She is alert and oriented to person, place, and time.  Skin: Skin is warm and dry.  Psychiatric: She has a normal mood and affect. Her behavior is normal.  Nursing note and vitals reviewed.   ED Course  Procedures (including critical care time)  DIAGNOSTIC STUDIES: Oxygen Saturation is 100% on RA, normal by my interpretation.    COORDINATION OF CARE:  3:29 PM Will prescribe sudafed and flonase.  Will provide ENT referral.  Patient acknowledges and agrees with plan.    Labs Review Labs Reviewed - No data to display  Imaging Review No results found. I have personally reviewed and evaluated these images and lab results as part of my medical decision-making.   EKG Interpretation None       Vital signs reviewed and are as follows: BP 137/80 mmHg  Pulse 87  Temp(Src) 97.7 F (36.5 C) (Oral)  Resp 20  SpO2 100%  LMP  10/08/2015   MDM   Final diagnoses:  Nasal congestion   Patient with waxing and waning but persistent nasal congestion over the past 2-3 months, has been using over-the-counter medications, allergy medications, antibiotics without improvement. Unclear etiology at this point. Feel that ENT follow-up would be helpful for further evaluation and treatment. No Afrin use to suggest rebound congestion. She appears well and non-toxic in appearance.   I personally performed the services described in this documentation, which was scribed in my presence. The recorded information has been reviewed and is accurate.     [name], PA-C 10/09/15 [place], MD 10/10/15 2005

## 2015-10-09 NOTE — ED Notes (Signed)
Pt. Stated, Vicki Mccoy had a stuffy nose for 3 weeks and a cough.

## 2015-10-09 NOTE — Discharge Instructions (Signed)
Please read and follow all provided instructions.  Your diagnoses today include:  1. Nasal congestion    Tests performed today include:  Vital signs. See below for your results today.   Medications prescribed:   Pseudoephedrine - decongestant medication to help with nasal congestion   Flonase - nasal spray for allergies  Take any prescribed medications only as directed. Treatment for your infection is aimed at treating the symptoms. There are no medications, such as antibiotics, that will cure your infection.   Home care instructions:  Follow any educational materials contained in this packet.   Please continue drinking plenty of fluids.  Use over-the-counter medicines as needed as directed on packaging for symptom relief.  You may also use ibuprofen or tylenol as directed on packaging for pain or fever.  Do not take multiple medicines containing Tylenol or acetaminophen to avoid taking too much of this medication.  Follow-up instructions: Please follow-up with the ENT physician listed for further evaluation.   Return instructions:   Please return to the Emergency Department if you experience worsening symptoms.   RETURN IMMEDIATELY IF you develop shortness of breath, confusion or altered mental status, a new rash, become dizzy, faint, or poorly responsive, or are unable to be cared for at home.  Please return if you have persistent vomiting and cannot keep down fluids or develop a fever that is not controlled by tylenol or motrin.    Please return if you have any other emergent concerns.  Additional Information:  Your vital signs today were: BP 137/80 mmHg   Pulse 87   Temp(Src) 97.7 F (36.5 C) (Oral)   Resp 20   SpO2 100%   LMP 10/08/2015 If your blood pressure (BP) was elevated above 135/85 this visit, please have this repeated by your doctor within one month. --------------

## 2015-10-12 DIAGNOSIS — E669 Obesity, unspecified: Secondary | ICD-10-CM | POA: Diagnosis not present

## 2015-10-12 DIAGNOSIS — E559 Vitamin D deficiency, unspecified: Secondary | ICD-10-CM | POA: Diagnosis not present

## 2015-10-12 DIAGNOSIS — J301 Allergic rhinitis due to pollen: Secondary | ICD-10-CM | POA: Diagnosis not present

## 2015-10-12 DIAGNOSIS — N760 Acute vaginitis: Secondary | ICD-10-CM | POA: Diagnosis not present

## 2015-10-12 DIAGNOSIS — E785 Hyperlipidemia, unspecified: Secondary | ICD-10-CM | POA: Diagnosis not present

## 2015-10-12 DIAGNOSIS — N390 Urinary tract infection, site not specified: Secondary | ICD-10-CM | POA: Diagnosis not present

## 2015-10-12 DIAGNOSIS — M545 Low back pain: Secondary | ICD-10-CM | POA: Diagnosis not present

## 2015-10-12 DIAGNOSIS — E119 Type 2 diabetes mellitus without complications: Secondary | ICD-10-CM | POA: Diagnosis not present

## 2016-01-11 DIAGNOSIS — M545 Low back pain: Secondary | ICD-10-CM | POA: Diagnosis not present

## 2016-01-11 DIAGNOSIS — J301 Allergic rhinitis due to pollen: Secondary | ICD-10-CM | POA: Diagnosis not present

## 2016-01-11 DIAGNOSIS — E559 Vitamin D deficiency, unspecified: Secondary | ICD-10-CM | POA: Diagnosis not present

## 2016-01-11 DIAGNOSIS — E785 Hyperlipidemia, unspecified: Secondary | ICD-10-CM | POA: Diagnosis not present

## 2016-01-11 DIAGNOSIS — E669 Obesity, unspecified: Secondary | ICD-10-CM | POA: Diagnosis not present

## 2016-01-11 DIAGNOSIS — E119 Type 2 diabetes mellitus without complications: Secondary | ICD-10-CM | POA: Diagnosis not present

## 2016-01-23 ENCOUNTER — Ambulatory Visit: Payer: Medicare Other | Admitting: Obstetrics

## 2016-01-26 DIAGNOSIS — H3553 Other dystrophies primarily involving the sensory retina: Secondary | ICD-10-CM | POA: Diagnosis not present

## 2016-01-26 DIAGNOSIS — H18413 Arcus senilis, bilateral: Secondary | ICD-10-CM | POA: Diagnosis not present

## 2016-01-26 DIAGNOSIS — E119 Type 2 diabetes mellitus without complications: Secondary | ICD-10-CM | POA: Diagnosis not present

## 2016-01-26 DIAGNOSIS — H53003 Unspecified amblyopia, bilateral: Secondary | ICD-10-CM | POA: Diagnosis not present

## 2016-01-26 DIAGNOSIS — H53023 Refractive amblyopia, bilateral: Secondary | ICD-10-CM | POA: Diagnosis not present

## 2016-01-26 DIAGNOSIS — H25093 Other age-related incipient cataract, bilateral: Secondary | ICD-10-CM | POA: Diagnosis not present

## 2016-01-26 DIAGNOSIS — H5213 Myopia, bilateral: Secondary | ICD-10-CM | POA: Diagnosis not present

## 2016-01-26 DIAGNOSIS — H52223 Regular astigmatism, bilateral: Secondary | ICD-10-CM | POA: Diagnosis not present

## 2016-02-22 ENCOUNTER — Encounter: Payer: Self-pay | Admitting: Obstetrics

## 2016-02-22 ENCOUNTER — Ambulatory Visit (INDEPENDENT_AMBULATORY_CARE_PROVIDER_SITE_OTHER): Payer: Medicare Other | Admitting: Obstetrics

## 2016-02-22 VITALS — BP 116/67 | HR 87 | Temp 98.3°F | Wt 209.0 lb

## 2016-02-22 DIAGNOSIS — Z1389 Encounter for screening for other disorder: Secondary | ICD-10-CM | POA: Diagnosis not present

## 2016-02-22 DIAGNOSIS — Z3041 Encounter for surveillance of contraceptive pills: Secondary | ICD-10-CM

## 2016-02-22 DIAGNOSIS — Z124 Encounter for screening for malignant neoplasm of cervix: Secondary | ICD-10-CM | POA: Diagnosis not present

## 2016-02-22 DIAGNOSIS — Z01419 Encounter for gynecological examination (general) (routine) without abnormal findings: Secondary | ICD-10-CM | POA: Diagnosis not present

## 2016-02-22 LAB — POCT URINALYSIS DIPSTICK
BILIRUBIN UA: NEGATIVE
GLUCOSE UA: NEGATIVE
Ketones, UA: NEGATIVE
Nitrite, UA: NEGATIVE
Protein, UA: NEGATIVE
RBC UA: NEGATIVE
SPEC GRAV UA: 1.015
Urobilinogen, UA: NEGATIVE
pH, UA: 5

## 2016-02-22 NOTE — Addendum Note (Signed)
Addended by: Lewie Loron D on: 02/22/2016 01:50 PM   Modules accepted: Orders

## 2016-02-22 NOTE — Progress Notes (Signed)
Subjective:        Vicki Mccoy is a 35 y.o. female here for a routine exam.  Current complaints: None.    Personal health questionnaire:  Is patient Ashkenazi Jewish, have a family history of breast and/or ovarian cancer: no Is there a family history of uterine cancer diagnosed at age < 58, gastrointestinal cancer, urinary tract cancer, family member who is a Field seismologist syndrome-associated carrier: no Is the patient overweight and hypertensive, family history of diabetes, personal history of gestational diabetes, preeclampsia or PCOS: no Is patient over 57, have PCOS,  family history of premature CHD under age 61, diabetes, smoke, have hypertension or peripheral artery disease:  no At any time, has a partner hit, kicked or otherwise hurt or frightened you?: no Over the past 2 weeks, have you felt down, depressed or hopeless?: no Over the past 2 weeks, have you felt little interest or pleasure in doing things?:no   Gynecologic History No LMP recorded. Contraception: OCP (estrogen/progesterone) Last Pap: 2016. Results were: normal Last mammogram: n/a. Results were: n/a  Obstetric History OB History  Gravida Para Term Preterm AB SAB TAB Ectopic Multiple Living  2 2 1 1  0 0    1    # Outcome Date GA Lbr Len/2nd Weight Sex Delivery Anes PTL Lv  2 Preterm 2008     Vag-Spont        Comments: 23 weeks  1 Term 2007     Vag-Spont         Past Medical History  Diagnosis Date  . Diabetes mellitus   . Sickle cell trait ([place])   . Arthritis   . Allergy     History reviewed. No pertinent past surgical history.   Current outpatient prescriptions:  .  metFORMIN (GLUCOPHAGE) 500 MG tablet, Take 500 mg by mouth daily with breakfast., Disp: , Rfl:  .  naproxen (NAPROSYN) 250 MG tablet, Take by mouth 2 (two) times daily with a meal., Disp: , Rfl:  .  Norgestimate-Ethinyl Estradiol Triphasic (TRINESSA, 28,) 0.18/0.215/0.25 MG-35 MCG tablet, Take 1 tablet by mouth daily., Disp: 28 tablet,  Rfl: 11 No Known Allergies  Social History  Substance Use Topics  . Smoking status: Never Smoker   . Smokeless tobacco: Never Used  . Alcohol Use: No    Family History  Problem Relation Age of Onset  . Sickle cell anemia Mother   . Cerebral palsy Daughter   . Allergic rhinitis Neg Hx   . Angioedema Neg Hx   . Asthma Neg Hx   . Eczema Neg Hx   . Urticaria Neg Hx   . Immunodeficiency Neg Hx       Review of Systems  Constitutional: negative for fatigue and weight loss Respiratory: negative for cough and wheezing Cardiovascular: negative for chest pain, fatigue and palpitations Gastrointestinal: negative for abdominal pain and change in bowel habits Musculoskeletal:negative for myalgias Neurological: negative for gait problems and tremors Behavioral/Psych: negative for abusive relationship, depression Endocrine: negative for temperature intolerance   Genitourinary:negative for abnormal menstrual periods, genital lesions, hot flashes, sexual problems and vaginal discharge Integument/breast: negative for breast lump, breast tenderness, nipple discharge and skin lesion(s)    Objective:       BP 116/67 mmHg  Pulse 87  Temp(Src) 98.3 F (36.8 C)  Wt 209 lb (94.802 kg) General:   alert  Skin:   no rash or abnormalities  Lungs:   clear to auscultation bilaterally  Heart:   regular rate and rhythm,  S1, S2 normal, no murmur, click, rub or gallop  Breasts:   normal without suspicious masses, skin or nipple changes or axillary nodes  Abdomen:  normal findings: no organomegaly, soft, non-tender and no hernia  Pelvis:  External genitalia: normal general appearance Urinary system: urethral meatus normal and bladder without fullness, nontender Vaginal: normal without tenderness, induration or masses Cervix: normal appearance Adnexa: normal bimanual exam Uterus: anteverted and non-tender, normal size   Lab Review Urine pregnancy test Labs reviewed yes Radiologic studies  reviewed yes    Assessment:    Healthy female exam.    Plan:    Education reviewed: calcium supplements, low fat, low cholesterol diet, safe sex/STD prevention, self breast exams and weight bearing exercise. Contraception: OCP (estrogen/progesterone). Follow up in: 1 year.   No orders of the defined types were placed in this encounter.   Orders Placed This Encounter  Procedures  . POCT urinalysis dipstick

## 2016-02-22 NOTE — Addendum Note (Signed)
Addended by: Lewie Loron D on: 02/22/2016 12:02 PM   Modules accepted: Orders

## 2016-02-22 NOTE — Addendum Note (Signed)
Addended by: Lewie Loron D on: 02/22/2016 03:40 PM   Modules accepted: Orders

## 2016-02-22 NOTE — Addendum Note (Signed)
Addended by: Lewie Loron D on: 02/22/2016 02:11 PM   Modules accepted: Orders

## 2016-02-25 LAB — PAP IG (IMAGE GUIDED): PAP SMEAR COMMENT: 0

## 2016-02-25 LAB — NUSWAB BV AND CANDIDA, NAA
CANDIDA GLABRATA, NAA: NEGATIVE
Candida albicans, NAA: NEGATIVE

## 2016-04-12 DIAGNOSIS — E559 Vitamin D deficiency, unspecified: Secondary | ICD-10-CM | POA: Diagnosis not present

## 2016-04-12 DIAGNOSIS — E669 Obesity, unspecified: Secondary | ICD-10-CM | POA: Diagnosis not present

## 2016-04-12 DIAGNOSIS — E119 Type 2 diabetes mellitus without complications: Secondary | ICD-10-CM | POA: Diagnosis not present

## 2016-04-12 DIAGNOSIS — E785 Hyperlipidemia, unspecified: Secondary | ICD-10-CM | POA: Diagnosis not present

## 2016-04-12 DIAGNOSIS — Z5181 Encounter for therapeutic drug level monitoring: Secondary | ICD-10-CM | POA: Diagnosis not present

## 2016-04-12 DIAGNOSIS — J301 Allergic rhinitis due to pollen: Secondary | ICD-10-CM | POA: Diagnosis not present

## 2016-04-12 DIAGNOSIS — M545 Low back pain: Secondary | ICD-10-CM | POA: Diagnosis not present

## 2016-05-07 ENCOUNTER — Ambulatory Visit (INDEPENDENT_AMBULATORY_CARE_PROVIDER_SITE_OTHER): Payer: Medicare Other | Admitting: Physician Assistant

## 2016-05-07 VITALS — BP 130/82 | HR 93 | Temp 98.2°F | Resp 16 | Ht 60.0 in | Wt 205.2 lb

## 2016-05-07 DIAGNOSIS — R1084 Generalized abdominal pain: Secondary | ICD-10-CM

## 2016-05-07 DIAGNOSIS — R300 Dysuria: Secondary | ICD-10-CM | POA: Diagnosis not present

## 2016-05-07 DIAGNOSIS — N390 Urinary tract infection, site not specified: Secondary | ICD-10-CM | POA: Diagnosis not present

## 2016-05-07 DIAGNOSIS — R82998 Other abnormal findings in urine: Secondary | ICD-10-CM

## 2016-05-07 DIAGNOSIS — N898 Other specified noninflammatory disorders of vagina: Secondary | ICD-10-CM

## 2016-05-07 LAB — POCT URINALYSIS DIP (MANUAL ENTRY)
Bilirubin, UA: NEGATIVE
GLUCOSE UA: NEGATIVE
Ketones, POC UA: NEGATIVE
NITRITE UA: NEGATIVE
Protein Ur, POC: NEGATIVE
Spec Grav, UA: 1.015
UROBILINOGEN UA: 0.2
pH, UA: 5.5

## 2016-05-07 LAB — POCT WET + KOH PREP
Trich by wet prep: ABSENT
Yeast by KOH: ABSENT
Yeast by wet prep: ABSENT

## 2016-05-07 LAB — POC MICROSCOPIC URINALYSIS (UMFC): MUCUS RE: ABSENT

## 2016-05-07 MED ORDER — CIPROFLOXACIN HCL 500 MG PO TABS: 500.0000 mg | ORAL_TABLET | Freq: Two times a day (BID) | ORAL | Status: AC

## 2016-05-07 MED ORDER — PHENAZOPYRIDINE HCL 200 MG PO TABS: 200.0000 mg | ORAL_TABLET | Freq: Three times a day (TID) | ORAL | Status: DC | PRN

## 2016-05-07 NOTE — Progress Notes (Signed)
[facility] [address], [place] [address] [phone]  Date:  05/07/2016   Name:  Vicki Mccoy   DOB:  1981-01-13   MRN:  [id number]  PCP:  Trey Sailors, PA   Chief Complaint  Patient presents with  . Back Pain    right lower back  . Flank Pain    right side  . Abdominal Pain    History of Present Illness:  Vicki Mccoy is a 35 y.o. female patient who presents to [facility]  For cc of generalized abdominal, flank, and back pain.   She has been drinking sodas.  This pain began 5 days ago.  No dysuria.  She notied hematuria, but this stopped 4 days ago.  Frequency.  She has clear thick discharge.  There is no odor to it. No rash.  She has some constipation every other day, but states that this is common.  No diarrhea.  No blood in stool.    Patient Active Problem List   Diagnosis Date Noted  . Chronic rhinitis 09/19/2015  . Coughing 09/19/2015  . Dyspareunia, female 04/18/2012  . URI 09/20/2010  . BACK PAIN 05/09/2010  . CANDIDIASIS OF VULVA AND VAGINA 01/27/2010  . FINGER PAIN 12/08/2009  . OBESITY 11/01/2009  . VAGINAL PRURITUS 11/01/2009  . DRY SKIN 11/01/2009  . DIABETES MELLITUS 08/02/2009  . SORE THROAT 12/22/2008  . DYSURIA 02/02/2008  . ALLERGIC RHINITIS 07/08/2007  . GERD 07/08/2007  . VAGINITIS, BACTERIAL, RECURRENT 07/08/2007    Past Medical History  Diagnosis Date  . Diabetes mellitus   . Sickle cell trait ([place])   . Arthritis   . Allergy     No past surgical history on file.  Social History  Substance Use Topics  . Smoking status: Never Smoker   . Smokeless tobacco: Never Used  . Alcohol Use: No    Family History  Problem Relation Age of Onset  . Sickle cell anemia Mother   . Cerebral palsy Daughter   . Allergic rhinitis Neg Hx   . Angioedema Neg Hx   . Asthma Neg Hx   . Eczema Neg Hx   . Urticaria Neg Hx   . Immunodeficiency Neg Hx     No Known Allergies  Medication list has been reviewed and  updated.  Current Outpatient Prescriptions on File Prior to Visit  Medication Sig Dispense Refill  . metFORMIN (GLUCOPHAGE) 500 MG tablet Take 500 mg by mouth daily with breakfast.    . Norgestimate-Ethinyl Estradiol Triphasic (TRINESSA, 28,) 0.18/0.215/0.25 MG-35 MCG tablet Take 1 tablet by mouth daily. 28 tablet 11  . naproxen (NAPROSYN) 250 MG tablet Take by mouth 2 (two) times daily with a meal. Reported on 05/07/2016     No current facility-administered medications on file prior to visit.    ROS ROS otherwise unremarkable unless listed above.   Physical Examination: BP 130/82 mmHg  Pulse 93  Temp(Src) 98.2 F (36.8 C) (Oral)  Resp 16  Ht 5' (1.524 m)  Wt 205 lb 3.2 oz (93.078 kg)  BMI 40.08 kg/m2  SpO2 97%  LMP 05/02/2016 Ideal Body Weight: Weight in (lb) to have BMI = 25: 127.7  Physical Exam  Constitutional: She is oriented to person, place, and time. She appears well-developed and well-nourished. No distress.  HENT:  Head: Normocephalic and atraumatic.  Right Ear: External ear normal.  Left Ear: External ear normal.  Eyes: Conjunctivae and EOM are normal. Pupils are equal, round, and  reactive to light.  Cardiovascular: Normal rate.   Pulmonary/Chest: Effort normal. No respiratory distress.  Abdominal: Soft. Normal appearance and bowel sounds are normal. There is tenderness in the suprapubic area. There is no CVA tenderness.  Neurological: She is alert and oriented to person, place, and time.  Skin: She is not diaphoretic.  Psychiatric: She has a normal mood and affect. Her behavior is normal.    Results for orders placed or performed in visit on 05/07/16  POCT urinalysis dipstick  Result Value Ref Range   Color, UA yellow yellow   Clarity, UA clear clear   Glucose, UA negative negative   Bilirubin, UA negative negative   Ketones, POC UA negative negative   Spec Grav, UA 1.015    Blood, UA trace-intact (A) negative   pH, UA 5.5    Protein Ur, POC negative  negative   Urobilinogen, UA 0.2    Nitrite, UA Negative Negative   Leukocytes, UA Trace (A) Negative  POCT Microscopic Urinalysis ([facility])  Result Value Ref Range   WBC,UR,HPF,POC None None WBC/hpf   RBC,UR,HPF,POC None None RBC/hpf   Bacteria Few (A) None, Too numerous to count   Mucus Absent Absent   Epithelial Cells, UR Per Microscopy Moderate (A) None, Too numerous to count cells/hpf  POCT Wet + KOH Prep  Result Value Ref Range   Yeast by KOH Absent Present, Absent   Yeast by wet prep Absent Present, Absent   WBC by wet prep None None, Few, Too numerous to count   Clue Cells Wet Prep HPF POC None None, Too numerous to count   Trich by wet prep Absent Present, Absent   Bacteria Wet Prep HPF POC Few None, Few, Too numerous to count   Epithelial Cells By [employer] ([facility]) Few None, Few, Too numerous to count   RBC,UR,HPF,POC None None RBC/hpf   Assessment and Plan: Vicki Mccoy is a 35 y.o. female who is here today for cc of abdominal pain. Treat for uti at this time.   Will place culture, and discontinue abx pending result.   Generalized abdominal pain - Plan: POCT urinalysis dipstick, POCT Microscopic Urinalysis ([facility]), ciprofloxacin (CIPRO) 500 MG tablet, Urine culture  Vaginal discharge - Plan: POCT Wet + KOH Prep, ciprofloxacin (CIPRO) 500 MG tablet  Dysuria - Plan: Urine culture, phenazopyridine (PYRIDIUM) 200 MG tablet  Leukocytes in urine - Plan: ciprofloxacin (CIPRO) 500 MG tablet, Urine culture  Ivar Drape, PA-C [facility] 05/07/2016 11:16 AM

## 2016-05-07 NOTE — Patient Instructions (Addendum)
     IF you received an x-ray today, you will receive an invoice from [facility]. Please contact [facility] at [phone] with questions or concerns regarding your invoice.   IF you received labwork today, you will receive an invoice from [employer]. Please contact [facility] at [phone] with questions or concerns regarding your invoice.   Our billing staff will not be able to assist you with questions regarding bills from these companies.  You will be contacted with the lab results as soon as they are available. The fastest way to get your results is to activate your My Chart account. Instructions are located on the last page of this paperwork. If you have not heard from [place] regarding the results in 2 weeks, please contact this office.    Please take the antibiotic as prescribed.  I will have your lab result of urine culture shortly.   If it is negative, we will have to review other reasons to have the abdominal pain.

## 2016-05-08 LAB — URINE CULTURE
COLONY COUNT: NO GROWTH
Organism ID, Bacteria: NO GROWTH

## 2016-05-09 ENCOUNTER — Telehealth: Payer: Self-pay | Admitting: *Deleted

## 2016-05-09 DIAGNOSIS — J342 Deviated nasal septum: Secondary | ICD-10-CM | POA: Diagnosis not present

## 2016-05-09 DIAGNOSIS — J343 Hypertrophy of nasal turbinates: Secondary | ICD-10-CM | POA: Diagnosis not present

## 2016-05-09 DIAGNOSIS — J310 Chronic rhinitis: Secondary | ICD-10-CM | POA: Diagnosis not present

## 2016-05-09 NOTE — Telephone Encounter (Signed)
Can patient return with another urine sample.  See lab results

## 2016-05-18 ENCOUNTER — Telehealth: Payer: Self-pay

## 2016-05-18 NOTE — Telephone Encounter (Signed)
-----   Message from Joretta Bachelor, [place] sent at 05/09/2016  9:32 AM EDT ----- There was no growth on urine culture.  Please stop the abx.  At this time we should check for other causes.  I would like to test for gonorrhea/chlamydia.  This would require you to return for a urine sample.  Please let me know if this is ok, and I will order it.  Let me know how you are doing at this time as well.

## 2016-05-18 NOTE — Telephone Encounter (Signed)
LMOVM for pt to return call to clinic - advised a need for further testing and to please call us.

## 2016-05-21 ENCOUNTER — Other Ambulatory Visit: Payer: Medicare Other

## 2016-05-21 ENCOUNTER — Other Ambulatory Visit: Payer: Self-pay

## 2016-05-21 ENCOUNTER — Other Ambulatory Visit: Payer: Self-pay | Admitting: Physician Assistant

## 2016-05-21 DIAGNOSIS — R300 Dysuria: Secondary | ICD-10-CM | POA: Diagnosis not present

## 2016-05-21 NOTE — Progress Notes (Signed)
PT. Came in for lab only visit and dropped off her urine for further testing

## 2016-05-22 LAB — GC/CHLAMYDIA PROBE AMP
CT Probe RNA: NOT DETECTED
GC PROBE AMP APTIMA: NOT DETECTED

## 2016-05-23 ENCOUNTER — Encounter: Payer: Self-pay | Admitting: *Deleted

## 2016-07-02 DIAGNOSIS — E785 Hyperlipidemia, unspecified: Secondary | ICD-10-CM | POA: Diagnosis not present

## 2016-07-02 DIAGNOSIS — J301 Allergic rhinitis due to pollen: Secondary | ICD-10-CM | POA: Diagnosis not present

## 2016-07-02 DIAGNOSIS — E119 Type 2 diabetes mellitus without complications: Secondary | ICD-10-CM | POA: Diagnosis not present

## 2016-07-02 DIAGNOSIS — M545 Low back pain: Secondary | ICD-10-CM | POA: Diagnosis not present

## 2016-07-02 DIAGNOSIS — E559 Vitamin D deficiency, unspecified: Secondary | ICD-10-CM | POA: Diagnosis not present

## 2016-07-02 DIAGNOSIS — Z5181 Encounter for therapeutic drug level monitoring: Secondary | ICD-10-CM | POA: Diagnosis not present

## 2016-07-02 DIAGNOSIS — E669 Obesity, unspecified: Secondary | ICD-10-CM | POA: Diagnosis not present

## 2016-09-07 DIAGNOSIS — J3489 Other specified disorders of nose and nasal sinuses: Secondary | ICD-10-CM | POA: Diagnosis not present

## 2016-09-07 DIAGNOSIS — J343 Hypertrophy of nasal turbinates: Secondary | ICD-10-CM | POA: Diagnosis not present

## 2016-09-07 DIAGNOSIS — J342 Deviated nasal septum: Secondary | ICD-10-CM | POA: Diagnosis not present

## 2016-09-21 ENCOUNTER — Telehealth: Payer: Self-pay | Admitting: *Deleted

## 2016-09-21 DIAGNOSIS — E785 Hyperlipidemia, unspecified: Secondary | ICD-10-CM | POA: Diagnosis not present

## 2016-09-21 DIAGNOSIS — E119 Type 2 diabetes mellitus without complications: Secondary | ICD-10-CM | POA: Diagnosis not present

## 2016-09-21 DIAGNOSIS — E669 Obesity, unspecified: Secondary | ICD-10-CM | POA: Diagnosis not present

## 2016-09-21 DIAGNOSIS — Z0000 Encounter for general adult medical examination without abnormal findings: Secondary | ICD-10-CM | POA: Diagnosis not present

## 2016-09-21 DIAGNOSIS — E559 Vitamin D deficiency, unspecified: Secondary | ICD-10-CM | POA: Diagnosis not present

## 2016-09-21 DIAGNOSIS — M545 Low back pain: Secondary | ICD-10-CM | POA: Diagnosis not present

## 2016-09-21 DIAGNOSIS — J301 Allergic rhinitis due to pollen: Secondary | ICD-10-CM | POA: Diagnosis not present

## 2016-09-21 NOTE — Telephone Encounter (Signed)
Patient called needing refills on her OCP

## 2016-09-22 ENCOUNTER — Other Ambulatory Visit: Payer: Self-pay | Admitting: Obstetrics

## 2016-09-22 DIAGNOSIS — Z3041 Encounter for surveillance of contraceptive pills: Secondary | ICD-10-CM

## 2016-09-22 MED ORDER — NORGESTIM-ETH ESTRAD TRIPHASIC 0.18/0.215/0.25 MG-35 MCG PO TABS: 1.0000 | ORAL_TABLET | Freq: Every day | ORAL | 11 refills | Status: DC

## 2016-09-22 NOTE — Telephone Encounter (Signed)
OCP's Rx.

## 2016-10-19 DIAGNOSIS — E119 Type 2 diabetes mellitus without complications: Secondary | ICD-10-CM | POA: Diagnosis not present

## 2016-10-19 DIAGNOSIS — Z23 Encounter for immunization: Secondary | ICD-10-CM | POA: Diagnosis not present

## 2016-10-19 DIAGNOSIS — E785 Hyperlipidemia, unspecified: Secondary | ICD-10-CM | POA: Diagnosis not present

## 2016-10-19 DIAGNOSIS — M545 Low back pain: Secondary | ICD-10-CM | POA: Diagnosis not present

## 2016-10-19 DIAGNOSIS — E559 Vitamin D deficiency, unspecified: Secondary | ICD-10-CM | POA: Diagnosis not present

## 2016-10-19 DIAGNOSIS — J301 Allergic rhinitis due to pollen: Secondary | ICD-10-CM | POA: Diagnosis not present

## 2016-10-19 DIAGNOSIS — E669 Obesity, unspecified: Secondary | ICD-10-CM | POA: Diagnosis not present

## 2017-03-06 DIAGNOSIS — E559 Vitamin D deficiency, unspecified: Secondary | ICD-10-CM | POA: Diagnosis not present

## 2017-03-06 DIAGNOSIS — M545 Low back pain: Secondary | ICD-10-CM | POA: Diagnosis not present

## 2017-03-06 DIAGNOSIS — J301 Allergic rhinitis due to pollen: Secondary | ICD-10-CM | POA: Diagnosis not present

## 2017-03-06 DIAGNOSIS — E785 Hyperlipidemia, unspecified: Secondary | ICD-10-CM | POA: Diagnosis not present

## 2017-03-06 DIAGNOSIS — E669 Obesity, unspecified: Secondary | ICD-10-CM | POA: Diagnosis not present

## 2017-03-06 DIAGNOSIS — E119 Type 2 diabetes mellitus without complications: Secondary | ICD-10-CM | POA: Diagnosis not present

## 2017-03-12 DIAGNOSIS — J310 Chronic rhinitis: Secondary | ICD-10-CM | POA: Diagnosis not present

## 2017-03-12 DIAGNOSIS — R0982 Postnasal drip: Secondary | ICD-10-CM | POA: Diagnosis not present

## 2017-03-18 DIAGNOSIS — H524 Presbyopia: Secondary | ICD-10-CM | POA: Diagnosis not present

## 2017-03-18 DIAGNOSIS — H25013 Cortical age-related cataract, bilateral: Secondary | ICD-10-CM | POA: Diagnosis not present

## 2017-03-18 DIAGNOSIS — H5213 Myopia, bilateral: Secondary | ICD-10-CM | POA: Diagnosis not present

## 2017-03-18 DIAGNOSIS — H52223 Regular astigmatism, bilateral: Secondary | ICD-10-CM | POA: Diagnosis not present

## 2017-03-18 DIAGNOSIS — H35361 Drusen (degenerative) of macula, right eye: Secondary | ICD-10-CM | POA: Diagnosis not present

## 2017-03-18 DIAGNOSIS — Z7984 Long term (current) use of oral hypoglycemic drugs: Secondary | ICD-10-CM | POA: Diagnosis not present

## 2017-03-18 DIAGNOSIS — H04123 Dry eye syndrome of bilateral lacrimal glands: Secondary | ICD-10-CM | POA: Diagnosis not present

## 2017-03-18 DIAGNOSIS — H35373 Puckering of macula, bilateral: Secondary | ICD-10-CM | POA: Diagnosis not present

## 2017-04-02 DIAGNOSIS — E785 Hyperlipidemia, unspecified: Secondary | ICD-10-CM | POA: Diagnosis not present

## 2017-04-02 DIAGNOSIS — R609 Edema, unspecified: Secondary | ICD-10-CM | POA: Diagnosis not present

## 2017-04-02 DIAGNOSIS — J301 Allergic rhinitis due to pollen: Secondary | ICD-10-CM | POA: Diagnosis not present

## 2017-04-02 DIAGNOSIS — E119 Type 2 diabetes mellitus without complications: Secondary | ICD-10-CM | POA: Diagnosis not present

## 2017-04-02 DIAGNOSIS — E1342 Other specified diabetes mellitus with diabetic polyneuropathy: Secondary | ICD-10-CM | POA: Diagnosis not present

## 2017-04-02 DIAGNOSIS — E669 Obesity, unspecified: Secondary | ICD-10-CM | POA: Diagnosis not present

## 2017-04-02 DIAGNOSIS — N289 Disorder of kidney and ureter, unspecified: Secondary | ICD-10-CM | POA: Diagnosis not present

## 2017-04-02 DIAGNOSIS — E559 Vitamin D deficiency, unspecified: Secondary | ICD-10-CM | POA: Diagnosis not present

## 2017-04-03 ENCOUNTER — Encounter (INDEPENDENT_AMBULATORY_CARE_PROVIDER_SITE_OTHER): Payer: Medicare Other | Admitting: Ophthalmology

## 2017-04-24 ENCOUNTER — Encounter (INDEPENDENT_AMBULATORY_CARE_PROVIDER_SITE_OTHER): Payer: Medicare Other | Admitting: Ophthalmology

## 2017-04-24 DIAGNOSIS — H538 Other visual disturbances: Secondary | ICD-10-CM

## 2017-04-24 DIAGNOSIS — H43813 Vitreous degeneration, bilateral: Secondary | ICD-10-CM | POA: Diagnosis not present

## 2017-04-24 DIAGNOSIS — H35033 Hypertensive retinopathy, bilateral: Secondary | ICD-10-CM

## 2017-04-24 DIAGNOSIS — H2513 Age-related nuclear cataract, bilateral: Secondary | ICD-10-CM

## 2017-04-24 DIAGNOSIS — I10 Essential (primary) hypertension: Secondary | ICD-10-CM | POA: Diagnosis not present

## 2017-04-29 DIAGNOSIS — E669 Obesity, unspecified: Secondary | ICD-10-CM | POA: Diagnosis not present

## 2017-04-29 DIAGNOSIS — E1342 Other specified diabetes mellitus with diabetic polyneuropathy: Secondary | ICD-10-CM | POA: Diagnosis not present

## 2017-04-29 DIAGNOSIS — E119 Type 2 diabetes mellitus without complications: Secondary | ICD-10-CM | POA: Diagnosis not present

## 2017-04-29 DIAGNOSIS — R609 Edema, unspecified: Secondary | ICD-10-CM | POA: Diagnosis not present

## 2017-04-29 DIAGNOSIS — J301 Allergic rhinitis due to pollen: Secondary | ICD-10-CM | POA: Diagnosis not present

## 2017-04-29 DIAGNOSIS — N289 Disorder of kidney and ureter, unspecified: Secondary | ICD-10-CM | POA: Diagnosis not present

## 2017-04-29 DIAGNOSIS — E785 Hyperlipidemia, unspecified: Secondary | ICD-10-CM | POA: Diagnosis not present

## 2017-04-29 DIAGNOSIS — E559 Vitamin D deficiency, unspecified: Secondary | ICD-10-CM | POA: Diagnosis not present

## 2017-04-29 DIAGNOSIS — Z30011 Encounter for initial prescription of contraceptive pills: Secondary | ICD-10-CM | POA: Diagnosis not present

## 2017-06-18 DIAGNOSIS — J310 Chronic rhinitis: Secondary | ICD-10-CM | POA: Diagnosis not present

## 2017-06-18 DIAGNOSIS — R0982 Postnasal drip: Secondary | ICD-10-CM | POA: Diagnosis not present

## 2017-07-23 ENCOUNTER — Ambulatory Visit: Payer: Medicare Other | Admitting: Podiatry

## 2017-07-30 DIAGNOSIS — E1342 Other specified diabetes mellitus with diabetic polyneuropathy: Secondary | ICD-10-CM | POA: Diagnosis not present

## 2017-07-30 DIAGNOSIS — Z30011 Encounter for initial prescription of contraceptive pills: Secondary | ICD-10-CM | POA: Diagnosis not present

## 2017-07-30 DIAGNOSIS — E119 Type 2 diabetes mellitus without complications: Secondary | ICD-10-CM | POA: Diagnosis not present

## 2017-07-30 DIAGNOSIS — R609 Edema, unspecified: Secondary | ICD-10-CM | POA: Diagnosis not present

## 2017-07-30 DIAGNOSIS — J301 Allergic rhinitis due to pollen: Secondary | ICD-10-CM | POA: Diagnosis not present

## 2017-07-30 DIAGNOSIS — E785 Hyperlipidemia, unspecified: Secondary | ICD-10-CM | POA: Diagnosis not present

## 2017-07-30 DIAGNOSIS — E559 Vitamin D deficiency, unspecified: Secondary | ICD-10-CM | POA: Diagnosis not present

## 2017-07-30 DIAGNOSIS — E669 Obesity, unspecified: Secondary | ICD-10-CM | POA: Diagnosis not present

## 2017-07-30 DIAGNOSIS — Z5181 Encounter for therapeutic drug level monitoring: Secondary | ICD-10-CM | POA: Diagnosis not present

## 2017-08-07 ENCOUNTER — Encounter: Payer: Self-pay | Admitting: Podiatry

## 2017-08-07 ENCOUNTER — Ambulatory Visit (INDEPENDENT_AMBULATORY_CARE_PROVIDER_SITE_OTHER): Payer: Medicare Other | Admitting: Podiatry

## 2017-08-07 ENCOUNTER — Ambulatory Visit (INDEPENDENT_AMBULATORY_CARE_PROVIDER_SITE_OTHER): Payer: Medicare Other

## 2017-08-07 VITALS — BP 132/84 | HR 87

## 2017-08-07 DIAGNOSIS — B070 Plantar wart: Secondary | ICD-10-CM

## 2017-08-07 DIAGNOSIS — M79671 Pain in right foot: Secondary | ICD-10-CM

## 2017-08-10 NOTE — Progress Notes (Signed)
   Subjective: Patient presents today with severe pain and tenderness on the plantar aspect of the right foot secondary to a plantars wart. She rates the pain at 10/10. Patient states that the pain has been present for several weeks now. Walking increases the pain. She denies alleviating factors. She has tried OTC removal products with no significant relief. Patient denies trauma.   Past Medical History:  Diagnosis Date  . Allergy   . Arthritis   . Diabetes mellitus   . Sickle cell trait ([place])     Objective: Physical Exam General: The patient is alert and oriented x3 in no acute distress.  Dermatology: Hyperkeratotic skin lesion noted to the plantar aspect of the right foot approximately 1 cm in diameter. Pinpoint bleeding noted upon debridement. Skin is warm, dry and supple bilateral lower extremities. Negative for open lesions or macerations.  Vascular: Palpable pedal pulses bilaterally. No edema or erythema noted. Capillary refill within normal limits.  Neurological: Epicritic and protective threshold grossly intact bilaterally.   Musculoskeletal Exam: Pain on palpation to the note skin lesion. Range of motion within normal limits to all pedal and ankle joints bilateral. Muscle strength 5/5 in all groups bilateral.   Assessment: #1 plantar wart right foot #2 pain in right foot   Plan of Care:  #1 Patient was evaluated. #2 Excisional debridement of the plantar wart lesion was performed using a chisel blade. Cantharone was applied and the lesion was dressed with a dry sterile dressing. #3 patient is to return to clinic in 2 weeks.   Edrick Kins, DPM [facility]  Dr. Edrick Kins, [address]                                        [place], [place] [address]                Office [phone]  Fax [phone]

## 2017-08-21 ENCOUNTER — Encounter: Payer: Self-pay | Admitting: Podiatry

## 2017-08-21 ENCOUNTER — Ambulatory Visit (INDEPENDENT_AMBULATORY_CARE_PROVIDER_SITE_OTHER): Payer: Medicare Other | Admitting: Podiatry

## 2017-08-21 DIAGNOSIS — B070 Plantar wart: Secondary | ICD-10-CM

## 2017-08-25 NOTE — Progress Notes (Signed)
   Subjective: Patient presents today for follow-up evaluation of a plantar wart to the right lower extremity. She states her symptoms are improving. She reports pain to the area when standing for a long period of time. Patient presents today for follow-up treatment and evaluation.   Past Medical History:  Diagnosis Date  . Allergy   . Arthritis   . Diabetes mellitus   . Sickle cell trait ([place])     Objective: Physical Exam General: The patient is alert and oriented x3 in no acute distress.  Dermatology: Hyperkeratotic skin lesion noted to the plantar aspect of the right foot approximately 1 cm in diameter. Pinpoint bleeding noted upon debridement. Skin is warm, dry and supple bilateral lower extremities. Negative for open lesions or macerations.  Vascular: Palpable pedal pulses bilaterally. No edema or erythema noted. Capillary refill within normal limits.  Neurological: Epicritic and protective threshold grossly intact bilaterally.   Musculoskeletal Exam: Pain on palpation to the note skin lesion.  Range of motion within normal limits to all pedal and ankle joints bilateral. Muscle strength 5/5 in all groups bilateral.   Assessment: #1 plantar wart right foot #2 pain in right foot   Plan of Care:  #1 Patient was evaluated. #2 Cantharone applied to area. #3 return to clinic in 2 weeks.   Edrick Kins, DPM [facility]  Dr. Edrick Kins, [address]                                        [place], [place] [address]                Office [phone]  Fax [phone]

## 2017-08-28 ENCOUNTER — Telehealth: Payer: Self-pay | Admitting: *Deleted

## 2017-08-28 DIAGNOSIS — E559 Vitamin D deficiency, unspecified: Secondary | ICD-10-CM | POA: Diagnosis not present

## 2017-08-28 DIAGNOSIS — E785 Hyperlipidemia, unspecified: Secondary | ICD-10-CM | POA: Diagnosis not present

## 2017-08-28 DIAGNOSIS — E669 Obesity, unspecified: Secondary | ICD-10-CM | POA: Diagnosis not present

## 2017-08-28 DIAGNOSIS — Z30011 Encounter for initial prescription of contraceptive pills: Secondary | ICD-10-CM | POA: Diagnosis not present

## 2017-08-28 DIAGNOSIS — E119 Type 2 diabetes mellitus without complications: Secondary | ICD-10-CM | POA: Diagnosis not present

## 2017-08-28 DIAGNOSIS — J301 Allergic rhinitis due to pollen: Secondary | ICD-10-CM | POA: Diagnosis not present

## 2017-08-28 DIAGNOSIS — E1342 Other specified diabetes mellitus with diabetic polyneuropathy: Secondary | ICD-10-CM | POA: Diagnosis not present

## 2017-08-28 DIAGNOSIS — R609 Edema, unspecified: Secondary | ICD-10-CM | POA: Diagnosis not present

## 2017-08-28 NOTE — Telephone Encounter (Signed)
Pt states she was seen last week and her wart is bleeding and she wanted to know if that was normal. Pt states she keeps wiping the surgical site off and it keeps bleeding. I told pt to perform 1/4 cup epsom salt and 1 qt warm water soaks 20 minutes daily and cover area with a lightly coated antibiotic ointment dressing. Report any redness, drainage or streaking from the site. Pt states understanding.

## 2017-09-04 ENCOUNTER — Encounter: Payer: Self-pay | Admitting: Podiatry

## 2017-09-04 ENCOUNTER — Ambulatory Visit (INDEPENDENT_AMBULATORY_CARE_PROVIDER_SITE_OTHER): Payer: Medicare Other | Admitting: Podiatry

## 2017-09-04 DIAGNOSIS — B070 Plantar wart: Secondary | ICD-10-CM | POA: Diagnosis not present

## 2017-09-06 DIAGNOSIS — H5213 Myopia, bilateral: Secondary | ICD-10-CM | POA: Diagnosis not present

## 2017-09-06 DIAGNOSIS — H31013 Macula scars of posterior pole (postinflammatory) (post-traumatic), bilateral: Secondary | ICD-10-CM | POA: Diagnosis not present

## 2017-09-06 NOTE — Progress Notes (Signed)
   Subjective: Patient presents today for follow-up evaluation of a plantar wart to the right foot. She states the area looks well and she denies any pain at this time. She does reports some pain and tingling if she stands for long periods of time. She reports some neuropathy as well. She has not done anything to treat this symptoms. Patient presents today for follow-up treatment and evaluation.   Past Medical History:  Diagnosis Date  . Allergy   . Arthritis   . Diabetes mellitus   . Sickle cell trait ([place])      Objective: Physical Exam General: The patient is alert and oriented x3 in no acute distress.  Dermatology: Hyperkeratotic skin lesion noted to the plantar aspect of the right foot approximately 1 cm in diameter. Pinpoint bleeding noted upon debridement. Skin is warm, dry and supple bilateral lower extremities. Negative for open lesions or macerations.  Vascular: Palpable pedal pulses bilaterally. No edema or erythema noted. Capillary refill within normal limits.  Neurological: Epicritic and protective threshold grossly intact bilaterally.   Musculoskeletal Exam: Pain on palpation to the note skin lesion.  Range of motion within normal limits to all pedal and ankle joints bilateral. Muscle strength 5/5 in all groups bilateral.   Assessment: #1 plantar wart right foot #2 pain in right foot   Plan of Care:  #1 Patient was evaluated. #2 Excisional debridement of the plantar wart lesion was performed using a chisel blade. Salicylic acid was applied and the lesion was dressed with a dry sterile dressing. #3  Recommend Dr. Renford Dills remover #4 return to clinic  When necessary   Edrick Kins, DPM [facility]  Dr. Edrick Kins, [address]                                        [place], [place] [address]                Office [phone]  Fax [phone]

## 2017-09-23 DIAGNOSIS — E119 Type 2 diabetes mellitus without complications: Secondary | ICD-10-CM | POA: Diagnosis not present

## 2017-09-23 DIAGNOSIS — E785 Hyperlipidemia, unspecified: Secondary | ICD-10-CM | POA: Diagnosis not present

## 2017-09-23 DIAGNOSIS — E1342 Other specified diabetes mellitus with diabetic polyneuropathy: Secondary | ICD-10-CM | POA: Diagnosis not present

## 2017-09-23 DIAGNOSIS — Z0000 Encounter for general adult medical examination without abnormal findings: Secondary | ICD-10-CM | POA: Diagnosis not present

## 2017-09-23 DIAGNOSIS — Z30011 Encounter for initial prescription of contraceptive pills: Secondary | ICD-10-CM | POA: Diagnosis not present

## 2017-09-23 DIAGNOSIS — E559 Vitamin D deficiency, unspecified: Secondary | ICD-10-CM | POA: Diagnosis not present

## 2017-09-23 DIAGNOSIS — J301 Allergic rhinitis due to pollen: Secondary | ICD-10-CM | POA: Diagnosis not present

## 2017-09-23 DIAGNOSIS — R609 Edema, unspecified: Secondary | ICD-10-CM | POA: Diagnosis not present

## 2017-09-23 DIAGNOSIS — E669 Obesity, unspecified: Secondary | ICD-10-CM | POA: Diagnosis not present

## 2017-12-16 DIAGNOSIS — R609 Edema, unspecified: Secondary | ICD-10-CM | POA: Diagnosis not present

## 2017-12-16 DIAGNOSIS — E1342 Other specified diabetes mellitus with diabetic polyneuropathy: Secondary | ICD-10-CM | POA: Diagnosis not present

## 2017-12-16 DIAGNOSIS — Z30011 Encounter for initial prescription of contraceptive pills: Secondary | ICD-10-CM | POA: Diagnosis not present

## 2017-12-16 DIAGNOSIS — E669 Obesity, unspecified: Secondary | ICD-10-CM | POA: Diagnosis not present

## 2017-12-16 DIAGNOSIS — E785 Hyperlipidemia, unspecified: Secondary | ICD-10-CM | POA: Diagnosis not present

## 2017-12-16 DIAGNOSIS — E559 Vitamin D deficiency, unspecified: Secondary | ICD-10-CM | POA: Diagnosis not present

## 2017-12-16 DIAGNOSIS — J301 Allergic rhinitis due to pollen: Secondary | ICD-10-CM | POA: Diagnosis not present

## 2017-12-16 DIAGNOSIS — E119 Type 2 diabetes mellitus without complications: Secondary | ICD-10-CM | POA: Diagnosis not present

## 2018-01-10 ENCOUNTER — Emergency Department (HOSPITAL_COMMUNITY)
Admission: EM | Admit: 2018-01-10 | Discharge: 2018-01-10 | Disposition: A | Discharge status: Home / Self Care | Payer: Medicare Other | Attending: Emergency Medicine | Admitting: Emergency Medicine

## 2018-01-10 ENCOUNTER — Encounter (HOSPITAL_COMMUNITY): Payer: Self-pay | Admitting: Emergency Medicine

## 2018-01-10 ENCOUNTER — Other Ambulatory Visit: Payer: Self-pay

## 2018-01-10 DIAGNOSIS — E119 Type 2 diabetes mellitus without complications: Secondary | ICD-10-CM | POA: Insufficient documentation

## 2018-01-10 DIAGNOSIS — R112 Nausea with vomiting, unspecified: Secondary | ICD-10-CM | POA: Insufficient documentation

## 2018-01-10 DIAGNOSIS — H8110 Benign paroxysmal vertigo, unspecified ear: Secondary | ICD-10-CM | POA: Diagnosis not present

## 2018-01-10 DIAGNOSIS — Z7984 Long term (current) use of oral hypoglycemic drugs: Secondary | ICD-10-CM | POA: Insufficient documentation

## 2018-01-10 DIAGNOSIS — R42 Dizziness and giddiness: Secondary | ICD-10-CM | POA: Diagnosis not present

## 2018-01-10 LAB — COMPREHENSIVE METABOLIC PANEL
ALK PHOS: 48 U/L (ref 38–126)
ALT: 12 U/L — AB (ref 14–54)
AST: 18 U/L (ref 15–41)
Albumin: 3.6 g/dL (ref 3.5–5.0)
Anion gap: 12 (ref 5–15)
BILIRUBIN TOTAL: 0.6 mg/dL (ref 0.3–1.2)
BUN: 10 mg/dL (ref 6–20)
CALCIUM: 8.9 mg/dL (ref 8.9–10.3)
CO2: 22 mmol/L (ref 22–32)
Chloride: 107 mmol/L (ref 101–111)
Creatinine, Ser: 0.97 mg/dL (ref 0.44–1.00)
Glucose, Bld: 147 mg/dL — ABNORMAL HIGH (ref 65–99)
Potassium: 4.2 mmol/L (ref 3.5–5.1)
Sodium: 141 mmol/L (ref 135–145)
TOTAL PROTEIN: 7 g/dL (ref 6.5–8.1)

## 2018-01-10 LAB — CBC
HCT: 38 % (ref 36.0–46.0)
Hemoglobin: 12.3 g/dL (ref 12.0–15.0)
MCH: 26.8 pg (ref 26.0–34.0)
MCHC: 32.4 g/dL (ref 30.0–36.0)
MCV: 82.8 fL (ref 78.0–100.0)
PLATELETS: 280 10*3/uL (ref 150–400)
RBC: 4.59 MIL/uL (ref 3.87–5.11)
RDW: 13.3 % (ref 11.5–15.5)
WBC: 6.6 10*3/uL (ref 4.0–10.5)

## 2018-01-10 LAB — LIPASE, BLOOD: Lipase: 26 U/L (ref 11–51)

## 2018-01-10 LAB — I-STAT BETA HCG BLOOD, ED (MC, WL, AP ONLY)

## 2018-01-10 MED ORDER — DIAZEPAM 5 MG/ML IJ SOLN
5.0000 mg | Freq: Once | INTRAMUSCULAR | Status: AC
  Administered 2018-01-10: 5 mg via INTRAVENOUS
  Filled 2018-01-10: qty 2

## 2018-01-10 MED ORDER — MECLIZINE HCL 25 MG PO TABS
25.0000 mg | ORAL_TABLET | Freq: Once | ORAL | Status: AC
Start: 2018-01-10 — End: 2018-01-10
  Administered 2018-01-10: 25 mg via ORAL
  Filled 2018-01-10: qty 1

## 2018-01-10 MED ORDER — SODIUM CHLORIDE 0.9 % IV BOLUS (SEPSIS)
1000.0000 mL | Freq: Once | INTRAVENOUS | Status: AC
  Administered 2018-01-10: 1000 mL via INTRAVENOUS

## 2018-01-10 MED ORDER — MECLIZINE HCL 25 MG PO TABS: 25.0000 mg | ORAL_TABLET | Freq: Three times a day (TID) | ORAL | 0 refills | Status: DC | PRN

## 2018-01-10 MED ORDER — ONDANSETRON 4 MG PO TBDP
4.0000 mg | ORAL_TABLET | Freq: Once | ORAL | Status: AC | PRN
  Administered 2018-01-10: 4 mg via ORAL
  Filled 2018-01-10: qty 1

## 2018-01-10 NOTE — ED Notes (Signed)
ED Provider at bedside. 

## 2018-01-10 NOTE — ED Provider Notes (Signed)
[facility] EMERGENCY DEPARTMENT Provider Note   CSN: [id number] Arrival date & time: 01/10/18  0345     History   Chief Complaint Chief Complaint  Patient presents with  . Emesis    Liightheaded/Dizzy    HPI Vicki Mccoy is a 37 y.o. female.  Patient presents to the emergency department for evaluation of sudden onset dizziness with nausea and vomiting.  She reports that she went to bed around 1130 last night.  She lied down on her back on the bed and then turned to her side and suddenly felt like she was spinning.  Since then every time she moves her head she gets a sensation of spinning and has had acute onset of nausea and vomiting secondary to the symptoms.  She has not had a headache or vision change.  No numbness, tingling or weakness of any extremities.      Past Medical History:  Diagnosis Date  . Allergy   . Arthritis   . Diabetes mellitus   . Sickle cell trait [facility])     Patient Active Problem List   Diagnosis Date Noted  . Coughing 09/19/2015  . Dyspareunia, female 04/18/2012  . URI 09/20/2010  . BACK PAIN 05/09/2010  . CANDIDIASIS OF VULVA AND VAGINA 01/27/2010  . FINGER PAIN 12/08/2009  . OBESITY 11/01/2009  . VAGINAL PRURITUS 11/01/2009  . DRY SKIN 11/01/2009  . DIABETES MELLITUS 08/02/2009  . SORE THROAT 12/22/2008  . DYSURIA 02/02/2008  . ALLERGIC RHINITIS 07/08/2007  . GERD 07/08/2007  . VAGINITIS, BACTERIAL, RECURRENT 07/08/2007    History reviewed. No pertinent surgical history.  OB History    Gravida Para Term Preterm AB Living   2 2 1 1  0 1   SAB TAB Ectopic Multiple Live Births   0               Home Medications    Prior to Admission medications   Medication Sig Start Date End Date Taking? Authorizing Provider  metFORMIN (GLUCOPHAGE) 500 MG tablet Take 500 mg by mouth daily with breakfast.   Yes [provider]  Norgestimate-Ethinyl Estradiol Triphasic (TRINESSA, 28,) 0.18/0.215/0.25 MG-35 MCG tablet  Take 1 tablet by mouth daily. 09/22/16  Yes [name], MD  meclizine (ANTIVERT) 25 MG tablet Take 1 tablet (25 mg total) by mouth 3 (three) times daily as needed for dizziness. 01/10/18   Orpah Greek, MD  phenazopyridine (PYRIDIUM) 200 MG tablet Take 1 tablet (200 mg total) by mouth 3 (three) times daily as needed for pain. Patient not taking: Reported on 01/10/2018 05/07/16   Joretta Bachelor, PA    Family History Family History  Problem Relation Age of Onset  . Sickle cell anemia Mother   . Cerebral palsy Daughter   . Allergic rhinitis Neg Hx   . Angioedema Neg Hx   . Asthma Neg Hx   . Eczema Neg Hx   . Urticaria Neg Hx   . Immunodeficiency Neg Hx     Social History Social History   Tobacco Use  . Smoking status: Never Smoker  . Smokeless tobacco: Never Used  Substance Use Topics  . Alcohol use: No    Alcohol/week: 0.0 oz  . Drug use: No     Allergies   Patient has no known allergies.   Review of Systems Review of Systems  Gastrointestinal: Positive for nausea and vomiting.  Neurological: Positive for dizziness.  All other systems reviewed and are negative.  Physical Exam Updated Vital Signs BP 120/75   Pulse (!) 55   Temp 97.8 F (36.6 C) (Oral)   Resp 17   Ht 5\' 4"  (1.626 m)   Wt 81.6 kg (180 lb)   SpO2 99%   BMI 30.90 kg/m   Physical Exam  Constitutional: She is oriented to person, place, and time. She appears well-developed and well-nourished. No distress.  HENT:  Head: Normocephalic and atraumatic.  Right Ear: Hearing normal.  Left Ear: Hearing normal.  Nose: Nose normal.  Mouth/Throat: Oropharynx is clear and moist and mucous membranes are normal.  Eyes: Conjunctivae and EOM are normal. Pupils are equal, round, and reactive to light.  Neck: Normal range of motion. Neck supple.  Cardiovascular: Regular rhythm, S1 normal and S2 normal. Exam reveals no gallop and no friction rub.  No murmur heard. Pulmonary/Chest: Effort  normal and breath sounds normal. No respiratory distress. She exhibits no tenderness.  Abdominal: Soft. Normal appearance and bowel sounds are normal. There is no hepatosplenomegaly. There is no tenderness. There is no rebound, no guarding, no tenderness at McBurney's point and negative Murphy's sign. No hernia.  Musculoskeletal: Normal range of motion.  Neurological: She is alert and oriented to person, place, and time. She has normal strength. No cranial nerve deficit or sensory deficit. Coordination normal. GCS eye subscore is 4. GCS verbal subscore is 5. GCS motor subscore is 6.  Extraocular muscle movement: normal No visual field cut Pupils: equal and reactive both direct and consensual response is normal    Sensory function is intact to light touch, pinprick Proprioception intact  Grip strength 5/5 symmetric in upper extremities No pronator drift Normal finger to nose bilaterally  Lower extremity strength 5/5 against gravity Normal heel to shin bilaterally     Skin: Skin is warm, dry and intact. No rash noted. No cyanosis.  Psychiatric: She has a normal mood and affect. Her speech is normal and behavior is normal. Thought content normal.  Nursing note and vitals reviewed.    ED Treatments / Results  Labs (all labs ordered are listed, but only abnormal results are displayed) Labs Reviewed  COMPREHENSIVE METABOLIC PANEL - Abnormal; Notable for the following components:      Result Value   Glucose, Bld 147 (*)    ALT 12 (*)    All other components within normal limits  LIPASE, BLOOD  CBC  URINALYSIS, ROUTINE W REFLEX MICROSCOPIC  I-STAT BETA HCG BLOOD, ED ([facility], [facility], [facility] ONLY)    EKG  EKG Interpretation None       Radiology No results found.  Procedures Procedures (including critical care time)  Medications Ordered in ED Medications  ondansetron (ZOFRAN-ODT) disintegrating tablet 4 mg (4 mg Oral Given 01/10/18 0404)  sodium chloride 0.9 % bolus 1,000 mL (0 mLs  Intravenous Stopped 01/10/18 0703)  meclizine (ANTIVERT) tablet 25 mg (25 mg Oral Given 01/10/18 0648)  diazepam (VALIUM) injection 5 mg (5 mg Intravenous Given 01/10/18 1610)     Initial Impression / Assessment and Plan / ED Course  I have reviewed the triage vital signs and the nursing notes.  Pertinent labs & imaging results that were available during my care of the patient were reviewed by me and considered in my medical decision making (see chart for details).     Patient presents to the emergency department for evaluation of dizziness.  Patient had acute onset of vertiginous dizziness after she laid back on the bed and then rolled over.  Since then  she has been having waves of spinning sensation when she moves her head.  She has had some nausea and vomiting associated with the symptoms.  She has a normal, nonfocal neurologic examination.  No ataxia, no sensory deficit, no weakness.  Presentation is very consistent with benign positional vertigo.  Basic labs unremarkable.  I did not feel that the patient required imaging because she had significant improvement with IV fluids, IV Valium and oral meclizine.  Patient will be discharged with meclizine, return if she has any worsening of her symptoms.  Final Clinical Impressions(s) / ED Diagnoses   Final diagnoses:  Benign paroxysmal positional vertigo, unspecified laterality    ED Discharge Orders        Ordered    meclizine (ANTIVERT) 25 MG tablet  3 times daily PRN     01/10/18 0722       Orpah Greek, MD 01/10/18 [phone]

## 2018-01-10 NOTE — ED Triage Notes (Signed)
Patient reports multiple emesis with lightheaded/dizziness onset last night , denies fever or diarrhea , no abdominal pain .

## 2018-02-20 DIAGNOSIS — H5213 Myopia, bilateral: Secondary | ICD-10-CM | POA: Diagnosis not present

## 2018-02-20 DIAGNOSIS — H31013 Macula scars of posterior pole (postinflammatory) (post-traumatic), bilateral: Secondary | ICD-10-CM | POA: Diagnosis not present

## 2018-03-06 DIAGNOSIS — Z30011 Encounter for initial prescription of contraceptive pills: Secondary | ICD-10-CM | POA: Diagnosis not present

## 2018-03-06 DIAGNOSIS — E785 Hyperlipidemia, unspecified: Secondary | ICD-10-CM | POA: Diagnosis not present

## 2018-03-06 DIAGNOSIS — E119 Type 2 diabetes mellitus without complications: Secondary | ICD-10-CM | POA: Diagnosis not present

## 2018-03-06 DIAGNOSIS — N390 Urinary tract infection, site not specified: Secondary | ICD-10-CM | POA: Diagnosis not present

## 2018-03-06 DIAGNOSIS — E1342 Other specified diabetes mellitus with diabetic polyneuropathy: Secondary | ICD-10-CM | POA: Diagnosis not present

## 2018-03-06 DIAGNOSIS — J301 Allergic rhinitis due to pollen: Secondary | ICD-10-CM | POA: Diagnosis not present

## 2018-03-06 DIAGNOSIS — E782 Mixed hyperlipidemia: Secondary | ICD-10-CM | POA: Diagnosis not present

## 2018-03-06 DIAGNOSIS — E559 Vitamin D deficiency, unspecified: Secondary | ICD-10-CM | POA: Diagnosis not present

## 2018-03-06 DIAGNOSIS — E669 Obesity, unspecified: Secondary | ICD-10-CM | POA: Diagnosis not present

## 2018-03-06 DIAGNOSIS — R609 Edema, unspecified: Secondary | ICD-10-CM | POA: Diagnosis not present

## 2018-03-17 DIAGNOSIS — Z30011 Encounter for initial prescription of contraceptive pills: Secondary | ICD-10-CM | POA: Diagnosis not present

## 2018-03-17 DIAGNOSIS — E669 Obesity, unspecified: Secondary | ICD-10-CM | POA: Diagnosis not present

## 2018-03-17 DIAGNOSIS — E1342 Other specified diabetes mellitus with diabetic polyneuropathy: Secondary | ICD-10-CM | POA: Diagnosis not present

## 2018-03-17 DIAGNOSIS — E559 Vitamin D deficiency, unspecified: Secondary | ICD-10-CM | POA: Diagnosis not present

## 2018-03-17 DIAGNOSIS — E785 Hyperlipidemia, unspecified: Secondary | ICD-10-CM | POA: Diagnosis not present

## 2018-03-17 DIAGNOSIS — R609 Edema, unspecified: Secondary | ICD-10-CM | POA: Diagnosis not present

## 2018-03-17 DIAGNOSIS — E119 Type 2 diabetes mellitus without complications: Secondary | ICD-10-CM | POA: Diagnosis not present

## 2018-03-17 DIAGNOSIS — J301 Allergic rhinitis due to pollen: Secondary | ICD-10-CM | POA: Diagnosis not present

## 2018-07-08 DIAGNOSIS — Z30011 Encounter for initial prescription of contraceptive pills: Secondary | ICD-10-CM | POA: Diagnosis not present

## 2018-07-08 DIAGNOSIS — E669 Obesity, unspecified: Secondary | ICD-10-CM | POA: Diagnosis not present

## 2018-07-08 DIAGNOSIS — E1342 Other specified diabetes mellitus with diabetic polyneuropathy: Secondary | ICD-10-CM | POA: Diagnosis not present

## 2018-07-08 DIAGNOSIS — J301 Allergic rhinitis due to pollen: Secondary | ICD-10-CM | POA: Diagnosis not present

## 2018-07-08 DIAGNOSIS — M545 Low back pain: Secondary | ICD-10-CM | POA: Diagnosis not present

## 2018-07-08 DIAGNOSIS — R609 Edema, unspecified: Secondary | ICD-10-CM | POA: Diagnosis not present

## 2018-07-08 DIAGNOSIS — E559 Vitamin D deficiency, unspecified: Secondary | ICD-10-CM | POA: Diagnosis not present

## 2018-07-08 DIAGNOSIS — E119 Type 2 diabetes mellitus without complications: Secondary | ICD-10-CM | POA: Diagnosis not present

## 2018-07-08 DIAGNOSIS — E785 Hyperlipidemia, unspecified: Secondary | ICD-10-CM | POA: Diagnosis not present

## 2018-09-09 DIAGNOSIS — T162XXA Foreign body in left ear, initial encounter: Secondary | ICD-10-CM | POA: Diagnosis not present

## 2018-09-24 DIAGNOSIS — E785 Hyperlipidemia, unspecified: Secondary | ICD-10-CM | POA: Diagnosis not present

## 2018-09-24 DIAGNOSIS — Z30011 Encounter for initial prescription of contraceptive pills: Secondary | ICD-10-CM | POA: Diagnosis not present

## 2018-09-24 DIAGNOSIS — E1342 Other specified diabetes mellitus with diabetic polyneuropathy: Secondary | ICD-10-CM | POA: Diagnosis not present

## 2018-09-24 DIAGNOSIS — E559 Vitamin D deficiency, unspecified: Secondary | ICD-10-CM | POA: Diagnosis not present

## 2018-09-24 DIAGNOSIS — Z0001 Encounter for general adult medical examination with abnormal findings: Secondary | ICD-10-CM | POA: Diagnosis not present

## 2018-09-24 DIAGNOSIS — J301 Allergic rhinitis due to pollen: Secondary | ICD-10-CM | POA: Diagnosis not present

## 2018-09-24 DIAGNOSIS — R609 Edema, unspecified: Secondary | ICD-10-CM | POA: Diagnosis not present

## 2018-09-24 DIAGNOSIS — E119 Type 2 diabetes mellitus without complications: Secondary | ICD-10-CM | POA: Diagnosis not present

## 2018-09-24 DIAGNOSIS — E669 Obesity, unspecified: Secondary | ICD-10-CM | POA: Diagnosis not present

## 2018-12-24 DIAGNOSIS — E119 Type 2 diabetes mellitus without complications: Secondary | ICD-10-CM | POA: Diagnosis not present

## 2018-12-24 DIAGNOSIS — E669 Obesity, unspecified: Secondary | ICD-10-CM | POA: Diagnosis not present

## 2018-12-24 DIAGNOSIS — E559 Vitamin D deficiency, unspecified: Secondary | ICD-10-CM | POA: Diagnosis not present

## 2018-12-24 DIAGNOSIS — E785 Hyperlipidemia, unspecified: Secondary | ICD-10-CM | POA: Diagnosis not present

## 2018-12-24 DIAGNOSIS — E1342 Other specified diabetes mellitus with diabetic polyneuropathy: Secondary | ICD-10-CM | POA: Diagnosis not present

## 2018-12-24 DIAGNOSIS — Z30011 Encounter for initial prescription of contraceptive pills: Secondary | ICD-10-CM | POA: Diagnosis not present

## 2018-12-24 DIAGNOSIS — R609 Edema, unspecified: Secondary | ICD-10-CM | POA: Diagnosis not present

## 2018-12-24 DIAGNOSIS — J301 Allergic rhinitis due to pollen: Secondary | ICD-10-CM | POA: Diagnosis not present

## 2018-12-30 DIAGNOSIS — H538 Other visual disturbances: Secondary | ICD-10-CM | POA: Diagnosis not present

## 2018-12-30 DIAGNOSIS — E119 Type 2 diabetes mellitus without complications: Secondary | ICD-10-CM | POA: Diagnosis not present

## 2018-12-30 DIAGNOSIS — H5213 Myopia, bilateral: Secondary | ICD-10-CM | POA: Diagnosis not present

## 2019-04-27 ENCOUNTER — Ambulatory Visit (INDEPENDENT_AMBULATORY_CARE_PROVIDER_SITE_OTHER): Payer: Medicare HMO | Admitting: Podiatry

## 2019-04-27 ENCOUNTER — Encounter: Payer: Self-pay | Admitting: Podiatry

## 2019-04-27 ENCOUNTER — Other Ambulatory Visit: Payer: Self-pay

## 2019-04-27 DIAGNOSIS — B070 Plantar wart: Secondary | ICD-10-CM

## 2019-04-29 NOTE — Progress Notes (Signed)
   Subjective: 38 year old female with PMHx of DM presenting today with a chief complaint of a plantar wart of the right foot that reappeared about three weeks ago. She states it has become increasingly painful in the last week. She has tried scraping the lesion herself for treatment. Bearing weight and ambulation increases the pain. Patient is here for further evaluation and treatment.   Past Medical History:  Diagnosis Date  . Allergy   . Arthritis   . Diabetes mellitus   . Sickle cell trait ([place])      Objective: Physical Exam General: The patient is alert and oriented x3 in no acute distress.  Dermatology: Hyperkeratotic skin lesion noted to the plantar aspect of the right foot approximately 1 cm in diameter. Pinpoint bleeding noted upon debridement. Skin is warm, dry and supple bilateral lower extremities. Negative for open lesions or macerations.  Vascular: Palpable pedal pulses bilaterally. No edema or erythema noted. Capillary refill within normal limits.  Neurological: Epicritic and protective threshold diminished bilaterally.   Musculoskeletal Exam: Pain on palpation to the note skin lesion.  Range of motion within normal limits to all pedal and ankle joints bilateral. Muscle strength 5/5 in all groups bilateral.   Assessment: #1 plantar wart right foot #2 pain in right foot   Plan of Care:  #1 Patient was evaluated. #2 Excisional debridement of the plantar wart lesion was performed using a chisel blade. Cantharone was applied and the lesion was dressed with a dry sterile dressing. #3 Return to clinic in 2 weeks.    Edrick Kins, DPM [facility]  Dr. Edrick Kins, [address]                                        [place], [place] [address]                Office [phone]  Fax [phone]

## 2019-05-11 ENCOUNTER — Telehealth: Payer: Self-pay | Admitting: Podiatry

## 2019-05-11 ENCOUNTER — Ambulatory Visit (INDEPENDENT_AMBULATORY_CARE_PROVIDER_SITE_OTHER): Payer: Medicare HMO | Admitting: Podiatry

## 2019-05-11 ENCOUNTER — Encounter: Payer: Self-pay | Admitting: Podiatry

## 2019-05-11 ENCOUNTER — Other Ambulatory Visit: Payer: Self-pay

## 2019-05-11 VITALS — Temp 97.6°F

## 2019-05-11 DIAGNOSIS — B070 Plantar wart: Secondary | ICD-10-CM | POA: Diagnosis not present

## 2019-05-11 NOTE — Telephone Encounter (Signed)
Pt is scheduled today at 2:15pm.

## 2019-05-11 NOTE — Telephone Encounter (Signed)
Pt is getting treated for plantars warts. She is stating that she has pain after Dr. Amalia Hailey puts a medication on the bottom of her foot and she is wondering if she should have someone drive her today for her appointment this afternoon. Please call patient

## 2019-05-13 NOTE — Progress Notes (Signed)
   Subjective: 38 year old female with PMHx of DM presenting today for follow up evaluation of a plantar wart of the right foot. She states she is doing well and denies any significant pain. She has been staying off the foot which has helped alleviate her symptoms. Patient is here for further evaluation and treatment.   Past Medical History:  Diagnosis Date  . Allergy   . Arthritis   . Diabetes mellitus   . Sickle cell trait ([place])      Objective: Physical Exam General: The patient is alert and oriented x3 in no acute distress.  Dermatology: Hyperkeratotic skin lesion noted to the plantar aspect of the right foot approximately 1 cm in diameter. Pinpoint bleeding noted upon debridement. Skin is warm, dry and supple bilateral lower extremities. Negative for open lesions or macerations.  Vascular: Palpable pedal pulses bilaterally. No edema or erythema noted. Capillary refill within normal limits.  Neurological: Epicritic and protective threshold diminished bilaterally.   Musculoskeletal Exam: Pain on palpation to the note skin lesion.  Range of motion within normal limits to all pedal and ankle joints bilateral. Muscle strength 5/5 in all groups bilateral.   Assessment: #1 plantar wart right foot   Plan of Care:  #1 Patient was evaluated. #2 Light debridement of the plantar wart lesion was performed using a chisel blade. Salinocaine was applied and the lesion was dressed with a dry sterile dressing. #3 Return to clinic as needed.    Edrick Kins, DPM [facility]  Dr. Edrick Kins, [address]                                        [place], [place] [address]                Office [phone]  Fax [phone]

## 2019-06-24 DIAGNOSIS — E559 Vitamin D deficiency, unspecified: Secondary | ICD-10-CM | POA: Diagnosis not present

## 2019-06-24 DIAGNOSIS — E1342 Other specified diabetes mellitus with diabetic polyneuropathy: Secondary | ICD-10-CM | POA: Diagnosis not present

## 2019-06-24 DIAGNOSIS — R609 Edema, unspecified: Secondary | ICD-10-CM | POA: Diagnosis not present

## 2019-06-24 DIAGNOSIS — E119 Type 2 diabetes mellitus without complications: Secondary | ICD-10-CM | POA: Diagnosis not present

## 2019-06-24 DIAGNOSIS — Z0000 Encounter for general adult medical examination without abnormal findings: Secondary | ICD-10-CM | POA: Diagnosis not present

## 2019-06-24 DIAGNOSIS — Z136 Encounter for screening for cardiovascular disorders: Secondary | ICD-10-CM | POA: Diagnosis not present

## 2019-06-24 DIAGNOSIS — J301 Allergic rhinitis due to pollen: Secondary | ICD-10-CM | POA: Diagnosis not present

## 2019-06-24 DIAGNOSIS — Z0100 Encounter for examination of eyes and vision without abnormal findings: Secondary | ICD-10-CM | POA: Diagnosis not present

## 2019-06-24 DIAGNOSIS — E785 Hyperlipidemia, unspecified: Secondary | ICD-10-CM | POA: Diagnosis not present

## 2019-09-28 DIAGNOSIS — E119 Type 2 diabetes mellitus without complications: Secondary | ICD-10-CM | POA: Diagnosis not present

## 2019-09-28 DIAGNOSIS — E559 Vitamin D deficiency, unspecified: Secondary | ICD-10-CM | POA: Diagnosis not present

## 2019-09-28 DIAGNOSIS — R609 Edema, unspecified: Secondary | ICD-10-CM | POA: Diagnosis not present

## 2019-09-28 DIAGNOSIS — Z0001 Encounter for general adult medical examination with abnormal findings: Secondary | ICD-10-CM | POA: Diagnosis not present

## 2019-09-28 DIAGNOSIS — J301 Allergic rhinitis due to pollen: Secondary | ICD-10-CM | POA: Diagnosis not present

## 2019-09-28 DIAGNOSIS — E1342 Other specified diabetes mellitus with diabetic polyneuropathy: Secondary | ICD-10-CM | POA: Diagnosis not present

## 2019-09-28 DIAGNOSIS — E785 Hyperlipidemia, unspecified: Secondary | ICD-10-CM | POA: Diagnosis not present

## 2019-09-28 DIAGNOSIS — E669 Obesity, unspecified: Secondary | ICD-10-CM | POA: Diagnosis not present

## 2019-09-28 DIAGNOSIS — Z30011 Encounter for initial prescription of contraceptive pills: Secondary | ICD-10-CM | POA: Diagnosis not present

## 2019-11-20 DIAGNOSIS — E559 Vitamin D deficiency, unspecified: Secondary | ICD-10-CM | POA: Diagnosis not present

## 2019-11-20 DIAGNOSIS — E669 Obesity, unspecified: Secondary | ICD-10-CM | POA: Diagnosis not present

## 2019-11-20 DIAGNOSIS — J301 Allergic rhinitis due to pollen: Secondary | ICD-10-CM | POA: Diagnosis not present

## 2019-11-20 DIAGNOSIS — R609 Edema, unspecified: Secondary | ICD-10-CM | POA: Diagnosis not present

## 2019-11-20 DIAGNOSIS — B070 Plantar wart: Secondary | ICD-10-CM | POA: Diagnosis not present

## 2019-11-20 DIAGNOSIS — E1342 Other specified diabetes mellitus with diabetic polyneuropathy: Secondary | ICD-10-CM | POA: Diagnosis not present

## 2019-11-20 DIAGNOSIS — E119 Type 2 diabetes mellitus without complications: Secondary | ICD-10-CM | POA: Diagnosis not present

## 2019-11-20 DIAGNOSIS — E785 Hyperlipidemia, unspecified: Secondary | ICD-10-CM | POA: Diagnosis not present

## 2019-11-23 ENCOUNTER — Ambulatory Visit: Payer: Medicare HMO | Admitting: Podiatry

## 2019-12-07 ENCOUNTER — Ambulatory Visit (INDEPENDENT_AMBULATORY_CARE_PROVIDER_SITE_OTHER): Payer: Medicare HMO | Admitting: Podiatry

## 2019-12-07 ENCOUNTER — Other Ambulatory Visit: Payer: Self-pay

## 2019-12-07 DIAGNOSIS — B070 Plantar wart: Secondary | ICD-10-CM

## 2019-12-10 NOTE — Progress Notes (Signed)
   Subjective: 39 y.o. female presenting today with a chief complaint of tingling, burning pain and pressure to the right fourth digit secondary to a plantar wart that has been present for over a year. She reports associated swelling of the area. Walking increases the pain. She has been using OTC wart removal medication for treatment with little relief. Patient is here for further evaluation and treatment.    Past Medical History:  Diagnosis Date  . Allergy   . Arthritis   . Diabetes mellitus   . Sickle cell trait ([place])     Objective: Physical Exam General: The patient is alert and oriented x3 in no acute distress.   Dermatology: Hyperkeratotic skin lesion(s) noted to the plantar aspect of the right foot approximately 1 cm in diameter. Pinpoint bleeding noted upon debridement. Skin is warm, dry and supple bilateral lower extremities. Negative for open lesions or macerations.   Vascular: Palpable pedal pulses bilaterally. No edema or erythema noted. Capillary refill within normal limits.   Neurological: Epicritic and protective threshold grossly intact bilaterally.    Musculoskeletal Exam: Pain on palpation to the noted skin lesion(s).  Range of motion within normal limits to all pedal and ankle joints bilateral. Muscle strength 5/5 in all groups bilateral.    Assessment: #1 plantar wart right sub-fourth MPJ     Plan of Care:  #1 Patient was evaluated. #2 Excisional debridement of the plantar wart lesion(s) was performed using a chisel blade. Salinocaine was applied and the lesion(s) was dressed with a dry sterile dressing. #3 recommended OTC wart remover daily.  #4 patient is to return to clinic in 4 weeks.     Edrick Kins, DPM [facility]  Dr. Edrick Kins, [address]                                        [place], [place] [address]                Office [phone]  Fax [phone]

## 2019-12-11 DIAGNOSIS — Z20828 Contact with and (suspected) exposure to other viral communicable diseases: Secondary | ICD-10-CM | POA: Diagnosis not present

## 2019-12-11 DIAGNOSIS — Z20822 Contact with and (suspected) exposure to covid-19: Secondary | ICD-10-CM | POA: Diagnosis not present

## 2019-12-18 DIAGNOSIS — E559 Vitamin D deficiency, unspecified: Secondary | ICD-10-CM | POA: Diagnosis not present

## 2019-12-18 DIAGNOSIS — E1342 Other specified diabetes mellitus with diabetic polyneuropathy: Secondary | ICD-10-CM | POA: Diagnosis not present

## 2019-12-18 DIAGNOSIS — E119 Type 2 diabetes mellitus without complications: Secondary | ICD-10-CM | POA: Diagnosis not present

## 2019-12-18 DIAGNOSIS — R609 Edema, unspecified: Secondary | ICD-10-CM | POA: Diagnosis not present

## 2019-12-18 DIAGNOSIS — J301 Allergic rhinitis due to pollen: Secondary | ICD-10-CM | POA: Diagnosis not present

## 2019-12-18 DIAGNOSIS — M25561 Pain in right knee: Secondary | ICD-10-CM | POA: Diagnosis not present

## 2019-12-18 DIAGNOSIS — E785 Hyperlipidemia, unspecified: Secondary | ICD-10-CM | POA: Diagnosis not present

## 2019-12-18 DIAGNOSIS — E669 Obesity, unspecified: Secondary | ICD-10-CM | POA: Diagnosis not present

## 2019-12-23 DIAGNOSIS — M2242 Chondromalacia patellae, left knee: Secondary | ICD-10-CM | POA: Diagnosis not present

## 2019-12-23 DIAGNOSIS — M2241 Chondromalacia patellae, right knee: Secondary | ICD-10-CM | POA: Diagnosis not present

## 2020-01-04 ENCOUNTER — Ambulatory Visit: Payer: Medicare HMO | Admitting: Podiatry

## 2020-01-11 ENCOUNTER — Ambulatory Visit (INDEPENDENT_AMBULATORY_CARE_PROVIDER_SITE_OTHER): Payer: Medicare HMO | Admitting: Podiatry

## 2020-01-11 ENCOUNTER — Other Ambulatory Visit: Payer: Self-pay

## 2020-01-11 DIAGNOSIS — B070 Plantar wart: Secondary | ICD-10-CM | POA: Diagnosis not present

## 2020-01-14 NOTE — Progress Notes (Signed)
   Subjective: 39 y.o. female presenting today for follow up evaluation of a plantar wart of the right sub-fourth MPJ. She states she is doing well and has improved. She denies any pain or swelling. She has been using the OTC wart remover as directed. Patient is here for further evaluation and treatment.    Past Medical History:  Diagnosis Date  . Allergy   . Arthritis   . Diabetes mellitus   . Sickle cell trait ([place])     Objective: Physical Exam General: The patient is alert and oriented x3 in no acute distress.   Dermatology: Hyperkeratotic skin lesion(s) noted to the plantar aspect of the right foot approximately 1 cm in diameter. Pinpoint bleeding noted upon debridement. Skin is warm, dry and supple bilateral lower extremities. Negative for open lesions or macerations.   Vascular: Palpable pedal pulses bilaterally. No edema or erythema noted. Capillary refill within normal limits.   Neurological: Epicritic and protective threshold grossly intact bilaterally.    Musculoskeletal Exam: Pain on palpation to the noted skin lesion(s).  Range of motion within normal limits to all pedal and ankle joints bilateral. Muscle strength 5/5 in all groups bilateral.    Assessment: #1 plantar wart right sub-fourth MPJ     Plan of Care:  #1 Patient was evaluated. #2 Excisional debridement of the plantar wart lesion(s) was performed using a chisel blade. Salinocaine was applied and the lesion(s) was dressed with a dry sterile dressing. #3 recommended OTC wart remover daily.  #4 patient is to return to clinic as needed.     Edrick Kins, DPM [facility]  Dr. Edrick Kins, [address]                                        [place],  [address]                Office [phone]  Fax [phone]

## 2020-04-19 DIAGNOSIS — E1342 Other specified diabetes mellitus with diabetic polyneuropathy: Secondary | ICD-10-CM | POA: Diagnosis not present

## 2020-04-19 DIAGNOSIS — J301 Allergic rhinitis due to pollen: Secondary | ICD-10-CM | POA: Diagnosis not present

## 2020-04-19 DIAGNOSIS — E559 Vitamin D deficiency, unspecified: Secondary | ICD-10-CM | POA: Diagnosis not present

## 2020-04-19 DIAGNOSIS — E119 Type 2 diabetes mellitus without complications: Secondary | ICD-10-CM | POA: Diagnosis not present

## 2020-04-19 DIAGNOSIS — E785 Hyperlipidemia, unspecified: Secondary | ICD-10-CM | POA: Diagnosis not present

## 2020-04-19 DIAGNOSIS — E669 Obesity, unspecified: Secondary | ICD-10-CM | POA: Diagnosis not present

## 2020-04-19 DIAGNOSIS — R609 Edema, unspecified: Secondary | ICD-10-CM | POA: Diagnosis not present

## 2020-04-19 DIAGNOSIS — R300 Dysuria: Secondary | ICD-10-CM | POA: Diagnosis not present

## 2020-06-27 DIAGNOSIS — E119 Type 2 diabetes mellitus without complications: Secondary | ICD-10-CM | POA: Diagnosis not present

## 2020-06-27 DIAGNOSIS — E1342 Other specified diabetes mellitus with diabetic polyneuropathy: Secondary | ICD-10-CM | POA: Diagnosis not present

## 2020-06-27 DIAGNOSIS — E559 Vitamin D deficiency, unspecified: Secondary | ICD-10-CM | POA: Diagnosis not present

## 2020-06-27 DIAGNOSIS — Z0000 Encounter for general adult medical examination without abnormal findings: Secondary | ICD-10-CM | POA: Diagnosis not present

## 2020-06-27 DIAGNOSIS — R609 Edema, unspecified: Secondary | ICD-10-CM | POA: Diagnosis not present

## 2020-06-27 DIAGNOSIS — Z0110 Encounter for examination of ears and hearing without abnormal findings: Secondary | ICD-10-CM | POA: Diagnosis not present

## 2020-06-27 DIAGNOSIS — E785 Hyperlipidemia, unspecified: Secondary | ICD-10-CM | POA: Diagnosis not present

## 2020-06-27 DIAGNOSIS — J301 Allergic rhinitis due to pollen: Secondary | ICD-10-CM | POA: Diagnosis not present

## 2020-06-27 DIAGNOSIS — E669 Obesity, unspecified: Secondary | ICD-10-CM | POA: Diagnosis not present

## 2020-09-14 DIAGNOSIS — E785 Hyperlipidemia, unspecified: Secondary | ICD-10-CM | POA: Diagnosis not present

## 2020-09-14 DIAGNOSIS — E1342 Other specified diabetes mellitus with diabetic polyneuropathy: Secondary | ICD-10-CM | POA: Diagnosis not present

## 2020-09-14 DIAGNOSIS — E669 Obesity, unspecified: Secondary | ICD-10-CM | POA: Diagnosis not present

## 2020-09-14 DIAGNOSIS — E559 Vitamin D deficiency, unspecified: Secondary | ICD-10-CM | POA: Diagnosis not present

## 2020-09-14 DIAGNOSIS — R609 Edema, unspecified: Secondary | ICD-10-CM | POA: Diagnosis not present

## 2020-09-14 DIAGNOSIS — K5909 Other constipation: Secondary | ICD-10-CM | POA: Diagnosis not present

## 2020-09-14 DIAGNOSIS — J301 Allergic rhinitis due to pollen: Secondary | ICD-10-CM | POA: Diagnosis not present

## 2020-09-14 DIAGNOSIS — E119 Type 2 diabetes mellitus without complications: Secondary | ICD-10-CM | POA: Diagnosis not present

## 2020-12-15 ENCOUNTER — Other Ambulatory Visit: Payer: Medicaid Other

## 2020-12-15 DIAGNOSIS — Z20822 Contact with and (suspected) exposure to covid-19: Secondary | ICD-10-CM

## 2020-12-16 LAB — SARS-COV-2, NAA 2 DAY TAT

## 2020-12-16 LAB — NOVEL CORONAVIRUS, NAA: SARS-CoV-2, NAA: NOT DETECTED

## 2022-01-11 ENCOUNTER — Encounter: Payer: Self-pay | Admitting: Neurology

## 2022-02-26 DIAGNOSIS — E119 Type 2 diabetes mellitus without complications: Secondary | ICD-10-CM | POA: Diagnosis not present

## 2022-04-12 DIAGNOSIS — E1142 Type 2 diabetes mellitus with diabetic polyneuropathy: Secondary | ICD-10-CM | POA: Diagnosis not present

## 2022-04-12 DIAGNOSIS — J301 Allergic rhinitis due to pollen: Secondary | ICD-10-CM | POA: Diagnosis not present

## 2022-04-12 DIAGNOSIS — E782 Mixed hyperlipidemia: Secondary | ICD-10-CM | POA: Diagnosis not present

## 2022-04-12 DIAGNOSIS — E1165 Type 2 diabetes mellitus with hyperglycemia: Secondary | ICD-10-CM | POA: Diagnosis not present

## 2022-04-12 DIAGNOSIS — G43019 Migraine without aura, intractable, without status migrainosus: Secondary | ICD-10-CM | POA: Diagnosis not present

## 2022-04-12 DIAGNOSIS — Z6830 Body mass index (BMI) 30.0-30.9, adult: Secondary | ICD-10-CM | POA: Diagnosis not present

## 2022-04-12 DIAGNOSIS — E6609 Other obesity due to excess calories: Secondary | ICD-10-CM | POA: Diagnosis not present

## 2022-05-02 NOTE — Progress Notes (Signed)
NEUROLOGY CONSULTATION NOTE  Vicki Mccoy MRN: [id number] DOB: April 14, 1981  Referring provider: Mliss Fritz, PA Primary care provider: Benito Mccreedy, MD  Reason for consult:  worsening migraines  Assessment/Plan:   Menstrual migraine, intractable  Take zolmitriptan 2.'5mg'$  twice daily for 7 days, beginning 2 days before start of menses. Discontinue sumatriptan. Keep headache diary Follow up 4 months.   Subjective:  Vicki Mccoy is a 41 year old female with DM 2 with polyneuropathy, arthritis and Sickle cell trait who presents for worsening migraines.  History supplemented by referring provider's note.  Onset:  Menstrual migraines as a teenager  They subsequently resolved but returned about 2 months ago.   Location:  left temple radiating into eye Quality:  throbbing Intensity:  10/10 Aura:  absent Prodrome:  absent Associated symptoms:  Photophobia, sometimes blurred vision.  She denies associated nausea, vomiting, phonophobia, osmophobia, autonomic symptoms, unilateral numbness or weakness. Duration:  3-4 days.  Aborts quickly with [facility] but pain will returns 8-9 hours later Frequency:  Occurs with her cycle.  Starts one day before menses Triggers:  Menses Relieving factors:  BC powder Activity:  aggravates  Current NSAIDS/analgesics:  BC powder Current triptans:  sumatriptan '25mg'$  (ineffective) Current ergotamine:  none Current anti-emetic:  none Current muscle relaxants:  none Current Antihypertensive medications:  none Current Antidepressant medications:  none Current Anticonvulsant medications:  none Current anti-CGRP:  none Current Vitamins/Herbal/Supplements:  D Current Antihistamines/Decongestants:  none Other therapy:  none Birth control/hormone:  estradiol   Past NSAIDS/analgesics:  naproxen, ibuprofen, Tylenol, Excedrin igraine Past abortive triptans:  none Past abortive ergotamine:  none Past muscle relaxants:  cyclobenzaprine Past anti-emetic:   none Past antihypertensive medications:  furosemide Past antidepressant medications:  none Past anticonvulsant medications:  gabapentin Past anti-CGRP:  none Past vitamins/Herbal/Supplements:  none Past antihistamines/decongestants:  meclizine, Zyrtec, Flonase Other past therapies:  none  Caffeine:  coffee occasionally.  Occasional soda Alcohol:  no Smoker:  no Diet:  5-6 bottles water daily.  Rarely soda.   Exercise:  walks Depression:  no; Anxiety:  no Other pain:  no Sleep hygiene:  good Family history of headache:        PAST MEDICAL HISTORY: Past Medical History:  Diagnosis Date   Allergy    Arthritis    Diabetes mellitus    Sickle cell trait ([place])     PAST SURGICAL HISTORY: No past surgical history on file.  MEDICATIONS: Current Outpatient Medications on File Prior to Visit  Medication Sig Dispense Refill   estradiol (ESTRACE) 0.5 MG tablet      metFORMIN (GLUCOPHAGE) 500 MG tablet Take 500 mg by mouth daily with breakfast.     SUMAtriptan (IMITREX) 25 MG tablet Take by mouth.     No current facility-administered medications on file prior to visit.     ALLERGIES: No Known Allergies  FAMILY HISTORY: Family History  Problem Relation Age of Onset   Sickle cell anemia Mother    Cerebral palsy Daughter    Allergic rhinitis Neg Hx    Angioedema Neg Hx    Asthma Neg Hx    Eczema Neg Hx    Urticaria Neg Hx    Immunodeficiency Neg Hx     Objective:  Blood pressure 117/73, pulse 84, height '5\' 5"'$  (1.651 m), weight 163 lb 6.4 oz (74.1 kg), SpO2 98 %. General: No acute distress.  Patient appears well-groomed.   Head:  Normocephalic/atraumatic Eyes:  fundi examined but not visualized Neck: supple, no paraspinal tenderness, full  range of motion Back: No paraspinal tenderness Heart: regular rate and rhythm Neurological Exam: Mental status: alert and oriented to person, place, and time, recent and remote memory intact, fund of knowledge intact, attention and  concentration intact, speech fluent and not dysarthric, language intact. Cranial nerves: CN I: not tested CN II: pupils equal, round and reactive to light, visual fields intact CN III, IV, VI:  full range of motion, no nystagmus, no ptosis CN V: facial sensation intact. CN VII: upper and lower face symmetric CN VIII: hearing intact CN IX, X: gag intact, uvula midline CN XI: sternocleidomastoid and trapezius muscles intact CN XII: tongue midline Bulk & Tone: normal, no fasciculations. Motor:  muscle strength 5/5 throughout Sensation:  Pinprick, temperature and vibratory sensation intact. Deep Tendon Reflexes:  2+ throughout,  toes downgoing.   Finger to nose testing:  Without dysmetria.   Gait:  Normal station and stride.  Romberg negative.    Thank you for allowing me to take part in the care of this patient.  Metta Clines, DO  CC: Raelyn Number, PA

## 2022-05-03 ENCOUNTER — Encounter: Payer: Self-pay | Admitting: Neurology

## 2022-05-03 ENCOUNTER — Ambulatory Visit (INDEPENDENT_AMBULATORY_CARE_PROVIDER_SITE_OTHER): Payer: Medicare HMO | Admitting: Neurology

## 2022-05-03 ENCOUNTER — Other Ambulatory Visit: Payer: Self-pay | Admitting: Neurology

## 2022-05-03 VITALS — BP 117/73 | HR 84 | Ht 65.0 in | Wt 163.4 lb

## 2022-05-03 DIAGNOSIS — G43839 Menstrual migraine, intractable, without status migrainosus: Secondary | ICD-10-CM | POA: Diagnosis not present

## 2022-05-03 DIAGNOSIS — E1159 Type 2 diabetes mellitus with other circulatory complications: Secondary | ICD-10-CM | POA: Diagnosis not present

## 2022-05-03 DIAGNOSIS — E119 Type 2 diabetes mellitus without complications: Secondary | ICD-10-CM | POA: Diagnosis not present

## 2022-05-03 MED ORDER — ZOLMITRIPTAN 2.5 MG PO TABS: ORAL_TABLET | ORAL | 5 refills | Status: DC

## 2022-05-03 NOTE — Patient Instructions (Signed)
   Take zolmitriptan 2.'5mg'$  tablet - take 1 tablet twice daily for 7 days beginning 2 days before first day of your period Limit use of pain relievers to no more than 2 days out of the week.  These medications include acetaminophen, NSAIDs (ibuprofen/Advil/Motrin, naproxen/Aleve, triptans (Imitrex/sumatriptan), Excedrin, and narcotics.  This will help reduce risk of rebound headaches. Be aware of common food triggers:  - Caffeine:  coffee, black tea, cola, Mt. Dew  - Chocolate  - Dairy:  aged cheeses (brie, blue, cheddar, gouda, [place], provolone, [place], Swiss, etc), chocolate milk, buttermilk, sour cream, limit eggs and yogurt  - Nuts, peanut butter  - Alcohol  - Cereals/grains:  FRESH breads (fresh bagels, sourdough, doughnuts), yeast productions  - Processed/canned/aged/cured meats (pre-packaged deli meats, hotdogs)  - MSG/glutamate:  soy sauce, flavor enhancer, pickled/preserved/marinated foods  - Sweeteners:  aspartame (Equal, Nutrasweet).  Sugar and Splenda are okay  - Vegetables:  legumes (lima beans, lentils, snow peas, fava beans, pinto peans, peas, garbanzo beans), sauerkraut, onions, olives, pickles  - Fruit:  avocados, bananas, citrus fruit (orange, lemon, grapefruit), mango  - Other:  Frozen meals, macaroni and cheese Routine exercise Stay adequately hydrated (aim for 64 oz water daily) Keep headache diary Maintain proper stress management Maintain proper sleep hygiene Do not skip meals Consider supplements:  magnesium citrate '400mg'$  daily, riboflavin '400mg'$  daily, coenzyme Q10 '100mg'$  three times daily.

## 2022-05-09 ENCOUNTER — Telehealth (HOSPITAL_COMMUNITY): Payer: Self-pay | Admitting: Pharmacy Technician

## 2022-05-09 ENCOUNTER — Other Ambulatory Visit (HOSPITAL_COMMUNITY): Payer: Self-pay

## 2022-05-09 NOTE — Telephone Encounter (Signed)
Patient Advocate Encounter   Received notification that prior authorization for ZOLMitriptan 2.'5MG'$  tablets is required.   PA submitted on 05/09/2022 Key [id number] Status is pending       Lyndel Safe, [place] Patient Advocate Specialist [place] Patient Advocate Team Direct Number: [phone]  Fax: [phone]

## 2022-05-10 NOTE — Telephone Encounter (Signed)
Patient Advocate Encounter  Received notification that the request for prior authorization for ZOLMitriptan 2.'5MG'$  tablets has been denied due to The preferred drug(s), you may not have tried, are: naratriptan tablet and rizatriptan tablet. Your provider needs to give [place] medical reasons why the preferred drug(s) would not work for you and/or would have bad side effects.       Lyndel Safe, [place] Patient Advocate Specialist [place] Patient Advocate Team Direct Number: [phone]  Fax: [phone]

## 2022-05-11 NOTE — Telephone Encounter (Signed)
Patient Advocate Encounter  Prior Authorization for  ZOLMitriptan 2.'5MG'$  tablets  has been approved.    PA# [id number] Effective dates: 05/11/2022 through 11/04/2022      Lyndel Safe, [place] Patient Advocate Specialist [place] Patient Advocate Team Direct Number: [phone]  Fax: [phone]

## 2022-05-11 NOTE — Telephone Encounter (Signed)
Appeal has been submitted  Lyndel Safe, [place] Patient Advocate Specialist [place] Patient Advocate Team Direct Number: [phone]  Fax: [phone]

## 2022-05-11 NOTE — Telephone Encounter (Signed)
Patient and Pharmacy advised of PA approval.  No medication in stock. Will order and have refill ready Monday per Pharmacy rep.

## 2022-05-28 ENCOUNTER — Encounter (HOSPITAL_BASED_OUTPATIENT_CLINIC_OR_DEPARTMENT_OTHER): Payer: Self-pay | Admitting: Emergency Medicine

## 2022-05-28 ENCOUNTER — Emergency Department (HOSPITAL_BASED_OUTPATIENT_CLINIC_OR_DEPARTMENT_OTHER)
Admission: EM | Admit: 2022-05-28 | Discharge: 2022-05-28 | Disposition: A | Discharge status: Home / Self Care | Payer: Medicare HMO | Attending: Emergency Medicine | Admitting: Emergency Medicine

## 2022-05-28 ENCOUNTER — Emergency Department (HOSPITAL_BASED_OUTPATIENT_CLINIC_OR_DEPARTMENT_OTHER): Payer: Medicare HMO

## 2022-05-28 ENCOUNTER — Other Ambulatory Visit: Payer: Self-pay

## 2022-05-28 DIAGNOSIS — W01198A Fall on same level from slipping, tripping and stumbling with subsequent striking against other object, initial encounter: Secondary | ICD-10-CM | POA: Insufficient documentation

## 2022-05-28 DIAGNOSIS — R519 Headache, unspecified: Secondary | ICD-10-CM | POA: Diagnosis not present

## 2022-05-28 DIAGNOSIS — R55 Syncope and collapse: Secondary | ICD-10-CM | POA: Diagnosis not present

## 2022-05-28 DIAGNOSIS — R42 Dizziness and giddiness: Secondary | ICD-10-CM | POA: Diagnosis not present

## 2022-05-28 DIAGNOSIS — W19XXXA Unspecified fall, initial encounter: Secondary | ICD-10-CM

## 2022-05-28 LAB — CBC
HCT: 37 % (ref 36.0–46.0)
Hemoglobin: 12.6 g/dL (ref 12.0–15.0)
MCH: 28 pg (ref 26.0–34.0)
MCHC: 34.1 g/dL (ref 30.0–36.0)
MCV: 82.2 fL (ref 80.0–100.0)
Platelets: 280 10*3/uL (ref 150–400)
RBC: 4.5 MIL/uL (ref 3.87–5.11)
RDW: 13.2 % (ref 11.5–15.5)
WBC: 5.5 10*3/uL (ref 4.0–10.5)
nRBC: 0 % (ref 0.0–0.2)

## 2022-05-28 LAB — BASIC METABOLIC PANEL
Anion gap: 7 (ref 5–15)
BUN: 12 mg/dL (ref 6–20)
CO2: 23 mmol/L (ref 22–32)
Calcium: 9.2 mg/dL (ref 8.9–10.3)
Chloride: 107 mmol/L (ref 98–111)
Creatinine, Ser: 0.95 mg/dL (ref 0.44–1.00)
GFR, Estimated: >60 mL/min (ref >60)
Glucose, Bld: 98 mg/dL (ref 70–99)
Potassium: 3.7 mmol/L (ref 3.5–5.1)
Sodium: 137 mmol/L (ref 135–145)

## 2022-05-28 LAB — URINALYSIS, ROUTINE W REFLEX MICROSCOPIC
Bilirubin Urine: NEGATIVE
Glucose, UA: NEGATIVE mg/dL
Hgb urine dipstick: NEGATIVE
Ketones, ur: NEGATIVE mg/dL
Leukocytes,Ua: NEGATIVE
Nitrite: NEGATIVE
Protein, ur: NEGATIVE mg/dL
Specific Gravity, Urine: <=1.005 (ref 1.005–1.030)
pH: 6 (ref 5.0–8.0)

## 2022-05-28 LAB — CBG MONITORING, ED: Glucose-Capillary: 102 mg/dL — ABNORMAL HIGH (ref 70–99)

## 2022-05-28 LAB — PREGNANCY, URINE: Preg Test, Ur: NEGATIVE

## 2022-05-28 MED ORDER — LIDOCAINE 5 % EX PTCH: 1.0000 | MEDICATED_PATCH | CUTANEOUS | 0 refills | Status: DC

## 2022-05-28 NOTE — Discharge Instructions (Signed)
I would encourage rest, hydration.  Tylenol as needed for headache  Follow-up with your primary care provider  Return for new or worsening symptoms.

## 2022-05-28 NOTE — ED Triage Notes (Addendum)
Pt reports mechanical fall in shower today, hitting the back of her head on faucet. States she felt dizzy prior to fall.  Endorses "pressure" in posterior head.  Reports hx of vertigo. Concerned she is dehydrated.  Denies LOC, blood thinners, neck/back pain.  Denies dizziness at this time.

## 2022-05-28 NOTE — ED Provider Notes (Signed)
[facility] Provider Note   CSN: [id number] Arrival date & time: 05/28/22  1429    History  Chief Complaint  Patient presents with   Fall   Head Injury    Vicki Mccoy is a 41 y.o. female past medical history here for evaluation after fall.  Patient states that she was in a warm shower.  She went to get out when she her legs she felt lightheaded subsequently fell.  She denies any syncope.  Hit the posterior aspect of her head on the counter.  Has had persistent headache.  Denies LOC, anticoagulation.  No chest pain, shortness of breath, vision changes, neck pain, abdominal pain, numbness or weakness.  She has been ambulatory since.  Does states she has not been drinking much is normal so she was concerned for dehydration.  Denies chance of pregnancy.  HPI     Home Medications Prior to Admission medications   Medication Sig Start Date End Date Taking? Authorizing Provider  lidocaine (LIDODERM) 5 % Place 1 patch onto the skin daily. Remove & Discard patch within 12 hours or as directed by MD 05/28/22  Yes Abdulah Iqbal A, PA-C  estradiol (ESTRACE) 0.5 MG tablet  12/22/19   [provider]  metFORMIN (GLUCOPHAGE) 500 MG tablet Take 500 mg by mouth daily with breakfast.    [provider]  ZOLMitriptan (ZOMIG) 2.5 MG tablet Take 1 tablet twice daily for 7 days beginning 2 days before first day of menses. 05/03/22   Pieter Partridge, DO      Allergies    Patient has no known allergies.    Review of Systems   Review of Systems  Constitutional: Negative.   HENT: Negative.    Respiratory: Negative.    Cardiovascular: Negative.   Gastrointestinal: Negative.   Genitourinary: Negative.   Musculoskeletal: Negative.   Skin: Negative.   Neurological:  Positive for dizziness (resolved PTA) and headaches. Negative for tremors, seizures, syncope, facial asymmetry, speech difficulty, weakness, light-headedness and numbness.  All other  systems reviewed and are negative.   Physical Exam Updated Vital Signs BP 114/72 (BP Location: Right Arm)   Pulse (!) 51   Temp 98.2 F (36.8 C) (Oral)   Resp 18   Ht '5\' 4"'$  (1.626 m)   Wt 73.9 kg   LMP 05/15/2022 (Exact Date)   SpO2 98%   BMI 27.98 kg/m  Physical Exam Physical Exam  Constitutional: Pt is oriented to person, place, and time. Pt appears well-developed and well-nourished. No distress.  HENT:  Head: Normocephalic and atraumatic.  Mouth/Throat: Oropharynx is clear and moist.  Eyes: Conjunctivae and EOM are normal. Pupils are equal, round, and reactive to light. No scleral icterus.  No horizontal, vertical or rotational nystagmus  Neck: Normal range of motion. Neck supple.  Full active and passive ROM without pain No midline or paraspinal tenderness No nuchal rigidity or meningeal signs  Cardiovascular: Normal rate, regular rhythm and intact distal pulses.   Pulmonary/Chest: Effort normal and breath sounds normal. No respiratory distress. Pt has no wheezes. No rales.  Abdominal: Soft. Bowel sounds are normal. There is no tenderness. There is no rebound and no guarding.  Musculoskeletal: Normal range of motion.  Lymphadenopathy:    No cervical adenopathy.  Neurological: Pt. is alert and oriented to person, place, and time. He has normal reflexes. No cranial nerve deficit.  Exhibits normal muscle tone. Coordination normal.  Mental Status:  Alert, oriented, thought content appropriate. Speech fluent  without evidence of aphasia. Able to follow 2 step commands without difficulty.  Cranial Nerves:  II:  Peripheral visual fields grossly normal, pupils equal, round, reactive to light III,IV, VI: ptosis not present, extra-ocular motions intact bilaterally  V,VII: smile symmetric, facial light touch sensation equal VIII: hearing grossly normal bilaterally  IX,X: midline uvula rise  XI: bilateral shoulder shrug equal and strong XII: midline tongue extension  Motor:  5/5  in upper and lower extremities bilaterally including strong and equal grip strength and dorsiflexion/plantar flexion Sensory: Pinprick and light touch normal in all extremities.  Cerebellar: normal finger-to-nose with bilateral upper extremities Gait: normal gait and balance CV: distal pulses palpable throughout   Skin: Skin is warm and dry. No rash noted. Pt is not diaphoretic.  Psychiatric: Pt has a normal mood and affect. Behavior is normal. Judgment and thought content normal.  Nursing note and vitals reviewed.  ED Results / Procedures / Treatments   Labs (all labs ordered are listed, but only abnormal results are displayed) Labs Reviewed  URINALYSIS, ROUTINE W REFLEX MICROSCOPIC - Abnormal; Notable for the following components:      Result Value   Color, Urine STRAW (*)    All other components within normal limits  CBG MONITORING, ED - Abnormal; Notable for the following components:   Glucose-Capillary 102 (*)    All other components within normal limits  BASIC METABOLIC PANEL  CBC  PREGNANCY, URINE  CBG MONITORING, ED    EKG EKG Interpretation  Date/Time:  Monday May 28 2022 14:43:12 EDT Ventricular Rate:  61 PR Interval:  150 QRS Duration: 122 QT Interval:  410 QTC Calculation: 412 R Axis:   93 Text Interpretation: Normal sinus rhythm Rightward axis Non-specific intra-ventricular conduction delay Borderline ECG No previous ECGs available Confirmed by Campbell Stall ([id number]) on 1/54/0086 5:56:18 PM  Radiology CT Head Wo Contrast  Result Date: 05/28/2022 CLINICAL DATA:  Headache, new or worsening, post traumatic (Age 33-49y) dizzy, post traumatic HA EXAM: CT HEAD WITHOUT CONTRAST TECHNIQUE: Contiguous axial images were obtained from the base of the skull through the vertex without intravenous contrast. RADIATION DOSE REDUCTION: This exam was performed according to the departmental dose-optimization program which includes automated exposure control, adjustment of the mA and/or  kV according to patient size and/or use of iterative reconstruction technique. COMPARISON:  CT head December 12, 2001. FINDINGS: Brain: No evidence of acute infarction, hemorrhage, hydrocephalus, extra-axial collection or mass lesion/mass effect. Vascular: No hyperdense vessel identified. Skull: No acute fracture. Sinuses/Orbits: Clear sinuses.  No acute orbital findings. Other: No mastoid effusions. IMPRESSION: No evidence of acute intracranial abnormality Electronically Signed   By: Margaretha Sheffield M.D.   On: 05/28/2022 16:58    Procedures Procedures    Medications Ordered in ED Medications - No data to display  ED Course/ Medical Decision Making/ A&P    41 year old here for evaluation of what sounds like near syncopal episode after taking a hot shower subsequently falling backwards hitting her head on the back of the sink.  By the time patient arrived she has mild posterior aspect headache however no dizziness.  She is ambulatory but difficulty.  Has a nonfocal neuro exam without deficits.  Actively drinking water in room.  Suspect she likely had a vasovagal episode while in the hot shower.  She is currently asymptomatic.  Labs and imaging personally viewed and interpreted  UA negative for infection Pregnancy test negative CBC without leukocytosis, hemoglobin 76.1 Metabolic panel without significant abnormality CT head without bleed,  stroke, fx  EKG without ischemic changes  Discussed results with patient in room.  Encouraged Tylenol as needed for headache, push fluids, rest at home.  She is agreeable.  Low suspicion for arrhythmia/cardiac etiology of her near syncope, stroke, SAH.  No evidence of traumatic injury on exam such as fracture, dislocation.  DC home symptomatic management  The patient has been appropriately medically screened and/or stabilized in the ED. I have low suspicion for any other emergent medical condition which would require further screening, evaluation or  treatment in the ED or require inpatient management.  Patient is hemodynamically stable and in no acute distress.  Patient able to ambulate in department prior to ED.  Evaluation does not show acute pathology that would require ongoing or additional emergent interventions while in the emergency department or further inpatient treatment.  I have discussed the diagnosis with the patient and answered all questions.  Pain is been managed while in the emergency department and patient has no further complaints prior to discharge.  Patient is comfortable with plan discussed in room and is stable for discharge at this time.  I have discussed strict return precautions for returning to the emergency department.  Patient was encouraged to follow-up with PCP/specialist refer to at discharge.                            Medical Decision Making Amount and/or Complexity of Data Reviewed External Data Reviewed: labs, radiology, ECG and notes. Labs: ordered. Decision-making details documented in ED Course. Radiology: ordered and independent interpretation performed. Decision-making details documented in ED Course.  Risk OTC drugs. Decision regarding hospitalization. Diagnosis or treatment significantly limited by social determinants of health.          Final Clinical Impression(s) / ED Diagnoses Final diagnoses:  Fall, initial encounter  Near syncope    Rx / DC Orders ED Discharge Orders          Ordered    lidocaine (LIDODERM) 5 %  Every 24 hours        05/28/22 1816              Nanci Lakatos A, PA-C 44/31/54 0086    Lianne Cure, DO 76/19/50 2351

## 2022-06-06 DIAGNOSIS — R5383 Other fatigue: Secondary | ICD-10-CM | POA: Diagnosis not present

## 2022-06-06 DIAGNOSIS — E782 Mixed hyperlipidemia: Secondary | ICD-10-CM | POA: Diagnosis not present

## 2022-06-06 DIAGNOSIS — E6609 Other obesity due to excess calories: Secondary | ICD-10-CM | POA: Diagnosis not present

## 2022-06-06 DIAGNOSIS — R0602 Shortness of breath: Secondary | ICD-10-CM | POA: Diagnosis not present

## 2022-06-06 DIAGNOSIS — E1165 Type 2 diabetes mellitus with hyperglycemia: Secondary | ICD-10-CM | POA: Diagnosis not present

## 2022-06-06 DIAGNOSIS — J301 Allergic rhinitis due to pollen: Secondary | ICD-10-CM | POA: Diagnosis not present

## 2022-06-06 DIAGNOSIS — R55 Syncope and collapse: Secondary | ICD-10-CM | POA: Diagnosis not present

## 2022-06-06 DIAGNOSIS — E1142 Type 2 diabetes mellitus with diabetic polyneuropathy: Secondary | ICD-10-CM | POA: Diagnosis not present

## 2022-06-06 DIAGNOSIS — G43019 Migraine without aura, intractable, without status migrainosus: Secondary | ICD-10-CM | POA: Diagnosis not present

## 2022-06-07 ENCOUNTER — Telehealth: Payer: Self-pay | Admitting: Neurology

## 2022-06-07 DIAGNOSIS — G43839 Menstrual migraine, intractable, without status migrainosus: Secondary | ICD-10-CM

## 2022-06-07 NOTE — Telephone Encounter (Signed)
Tried calling patient no answer. LMOVm for her to call us back.

## 2022-06-07 NOTE — Telephone Encounter (Signed)
Pt called in stating she was having a lot of side effects from the Zomig. She was having dizziness, shortness of breath, fainting, and low energy. She stopped taking it yesterday. She would like to know what to do from here?

## 2022-06-08 MED ORDER — TOPIRAMATE 25 MG PO TABS: 25.0000 mg | ORAL_TABLET | Freq: Every day | ORAL | 0 refills | Status: DC

## 2022-06-08 MED ORDER — RIZATRIPTAN BENZOATE 5 MG PO TABS: 5.0000 mg | ORAL_TABLET | ORAL | 0 refills | Status: DC | PRN

## 2022-06-08 NOTE — Telephone Encounter (Signed)
Pt advised of note Dr.Jaffe, Instead, I would start a daily preventative.  I recommend topiramate '25mg'$  at bedtime.  If no improvement in 4 weeks, contact us and we can increase dose.  She should note that side effects may be numbness and tingling sensation (involving anywhere of the body), so if she experiences this, not to worry.  Drink plenty of water.  Take precautions not to get pregnant as it is a medication that can't be taken during pregnancy.     For rescue medication (when she gets a migraine), please prescribe rizatriptan '5mg'$  - 1 tablet as needed.  May repeat after 2 hours.  Maximum 2 tablets in 24 hours.    Per patient the shortness of breathe was only in the morning, She if feeling much.   Patient agrees to start  Topiramate 25 mg and Rizatriptan 5 mg.

## 2022-06-30 ENCOUNTER — Other Ambulatory Visit: Payer: Self-pay | Admitting: Neurology

## 2022-06-30 DIAGNOSIS — G43839 Menstrual migraine, intractable, without status migrainosus: Secondary | ICD-10-CM

## 2022-07-05 ENCOUNTER — Other Ambulatory Visit: Payer: Self-pay | Admitting: Neurology

## 2022-07-05 DIAGNOSIS — G43839 Menstrual migraine, intractable, without status migrainosus: Secondary | ICD-10-CM

## 2022-07-12 DIAGNOSIS — J301 Allergic rhinitis due to pollen: Secondary | ICD-10-CM | POA: Diagnosis not present

## 2022-07-12 DIAGNOSIS — G43019 Migraine without aura, intractable, without status migrainosus: Secondary | ICD-10-CM | POA: Diagnosis not present

## 2022-07-12 DIAGNOSIS — E1165 Type 2 diabetes mellitus with hyperglycemia: Secondary | ICD-10-CM | POA: Diagnosis not present

## 2022-07-12 DIAGNOSIS — E6609 Other obesity due to excess calories: Secondary | ICD-10-CM | POA: Diagnosis not present

## 2022-07-12 DIAGNOSIS — Z6830 Body mass index (BMI) 30.0-30.9, adult: Secondary | ICD-10-CM | POA: Diagnosis not present

## 2022-07-12 DIAGNOSIS — E782 Mixed hyperlipidemia: Secondary | ICD-10-CM | POA: Diagnosis not present

## 2022-07-12 DIAGNOSIS — Z0000 Encounter for general adult medical examination without abnormal findings: Secondary | ICD-10-CM | POA: Diagnosis not present

## 2022-07-12 DIAGNOSIS — E1142 Type 2 diabetes mellitus with diabetic polyneuropathy: Secondary | ICD-10-CM | POA: Diagnosis not present

## 2022-07-26 DIAGNOSIS — J301 Allergic rhinitis due to pollen: Secondary | ICD-10-CM | POA: Diagnosis not present

## 2022-07-26 DIAGNOSIS — E1142 Type 2 diabetes mellitus with diabetic polyneuropathy: Secondary | ICD-10-CM | POA: Diagnosis not present

## 2022-07-26 DIAGNOSIS — G43019 Migraine without aura, intractable, without status migrainosus: Secondary | ICD-10-CM | POA: Diagnosis not present

## 2022-07-26 DIAGNOSIS — E782 Mixed hyperlipidemia: Secondary | ICD-10-CM | POA: Diagnosis not present

## 2022-07-26 DIAGNOSIS — E1165 Type 2 diabetes mellitus with hyperglycemia: Secondary | ICD-10-CM | POA: Diagnosis not present

## 2022-07-26 DIAGNOSIS — Z6830 Body mass index (BMI) 30.0-30.9, adult: Secondary | ICD-10-CM | POA: Diagnosis not present

## 2022-07-26 DIAGNOSIS — E6609 Other obesity due to excess calories: Secondary | ICD-10-CM | POA: Diagnosis not present

## 2022-08-08 ENCOUNTER — Other Ambulatory Visit: Payer: Self-pay | Admitting: Neurology

## 2022-08-08 DIAGNOSIS — G43839 Menstrual migraine, intractable, without status migrainosus: Secondary | ICD-10-CM

## 2022-08-21 ENCOUNTER — Encounter (HOSPITAL_BASED_OUTPATIENT_CLINIC_OR_DEPARTMENT_OTHER): Payer: Self-pay | Admitting: Emergency Medicine

## 2022-08-21 ENCOUNTER — Ambulatory Visit: Admit: 2022-08-21 | Disposition: A | Discharge status: Other Acute Inpatient Hospital | Payer: Medicare HMO

## 2022-08-21 ENCOUNTER — Inpatient Hospital Stay (HOSPITAL_BASED_OUTPATIENT_CLINIC_OR_DEPARTMENT_OTHER)
Admission: EM | Admit: 2022-08-21 | Discharge: 2022-08-23 | DRG: 277 | Disposition: A | Discharge status: Home / Self Care | Payer: Medicare HMO | Attending: Internal Medicine | Admitting: Internal Medicine

## 2022-08-21 ENCOUNTER — Encounter (HOSPITAL_COMMUNITY): Payer: Self-pay

## 2022-08-21 ENCOUNTER — Emergency Department (HOSPITAL_BASED_OUTPATIENT_CLINIC_OR_DEPARTMENT_OTHER): Payer: Medicare HMO | Admitting: Radiology

## 2022-08-21 ENCOUNTER — Other Ambulatory Visit: Payer: Self-pay

## 2022-08-21 DIAGNOSIS — D573 Sickle-cell trait: Secondary | ICD-10-CM | POA: Diagnosis not present

## 2022-08-21 DIAGNOSIS — Z1152 Encounter for screening for COVID-19: Secondary | ICD-10-CM

## 2022-08-21 DIAGNOSIS — D8685 Sarcoid myocarditis: Secondary | ICD-10-CM | POA: Diagnosis not present

## 2022-08-21 DIAGNOSIS — R55 Syncope and collapse: Secondary | ICD-10-CM

## 2022-08-21 DIAGNOSIS — E119 Type 2 diabetes mellitus without complications: Secondary | ICD-10-CM | POA: Diagnosis not present

## 2022-08-21 DIAGNOSIS — R079 Chest pain, unspecified: Secondary | ICD-10-CM | POA: Diagnosis not present

## 2022-08-21 DIAGNOSIS — K449 Diaphragmatic hernia without obstruction or gangrene: Secondary | ICD-10-CM | POA: Diagnosis not present

## 2022-08-21 DIAGNOSIS — Z7984 Long term (current) use of oral hypoglycemic drugs: Secondary | ICD-10-CM

## 2022-08-21 DIAGNOSIS — I442 Atrioventricular block, complete: Secondary | ICD-10-CM | POA: Diagnosis not present

## 2022-08-21 DIAGNOSIS — Z832 Family history of diseases of the blood and blood-forming organs and certain disorders involving the immune mechanism: Secondary | ICD-10-CM | POA: Diagnosis not present

## 2022-08-21 DIAGNOSIS — D869 Sarcoidosis, unspecified: Secondary | ICD-10-CM | POA: Diagnosis not present

## 2022-08-21 DIAGNOSIS — R001 Bradycardia, unspecified: Secondary | ICD-10-CM | POA: Diagnosis present

## 2022-08-21 DIAGNOSIS — J90 Pleural effusion, not elsewhere classified: Secondary | ICD-10-CM | POA: Diagnosis not present

## 2022-08-21 DIAGNOSIS — J9811 Atelectasis: Secondary | ICD-10-CM | POA: Diagnosis not present

## 2022-08-21 LAB — BASIC METABOLIC PANEL
Anion gap: 10 (ref 5–15)
BUN: 14 mg/dL (ref 6–20)
CO2: 23 mmol/L (ref 22–32)
Calcium: 9.7 mg/dL (ref 8.9–10.3)
Chloride: 106 mmol/L (ref 98–111)
Creatinine, Ser: 0.79 mg/dL (ref 0.44–1.00)
GFR, Estimated: >60 mL/min (ref >60)
Glucose, Bld: 92 mg/dL (ref 70–99)
Potassium: 4.2 mmol/L (ref 3.5–5.1)
Sodium: 139 mmol/L (ref 135–145)

## 2022-08-21 LAB — URINALYSIS, ROUTINE W REFLEX MICROSCOPIC
Bilirubin Urine: NEGATIVE
Glucose, UA: NEGATIVE mg/dL
Ketones, ur: NEGATIVE mg/dL
Leukocytes,Ua: NEGATIVE
Nitrite: NEGATIVE
Protein, ur: NEGATIVE mg/dL
Specific Gravity, Urine: 1.009 (ref 1.005–1.030)
pH: 5.5 (ref 5.0–8.0)

## 2022-08-21 LAB — PREGNANCY, URINE: Preg Test, Ur: NEGATIVE

## 2022-08-21 LAB — CBC
HCT: 39.7 % (ref 36.0–46.0)
Hemoglobin: 13.2 g/dL (ref 12.0–15.0)
MCH: 27.5 pg (ref 26.0–34.0)
MCHC: 33.2 g/dL (ref 30.0–36.0)
MCV: 82.7 fL (ref 80.0–100.0)
Platelets: 328 10*3/uL (ref 150–400)
RBC: 4.8 MIL/uL (ref 3.87–5.11)
RDW: 13.8 % (ref 11.5–15.5)
WBC: 4.5 10*3/uL (ref 4.0–10.5)
nRBC: 0 % (ref 0.0–0.2)

## 2022-08-21 LAB — T4, FREE: Free T4: 1 ng/dL (ref 0.61–1.12)

## 2022-08-21 LAB — TSH: TSH: 0.765 u[IU]/mL (ref 0.350–4.500)

## 2022-08-21 LAB — HCG, SERUM, QUALITATIVE: Preg, Serum: NEGATIVE

## 2022-08-21 LAB — TROPONIN I (HIGH SENSITIVITY)
Troponin I (High Sensitivity): 22 ng/L — ABNORMAL HIGH (ref <18)
Troponin I (High Sensitivity): 30 ng/L — ABNORMAL HIGH (ref <18)

## 2022-08-21 LAB — D-DIMER, QUANTITATIVE: D-Dimer, Quant: 0.45 ug/mL-FEU (ref 0.00–0.50)

## 2022-08-21 LAB — RESP PANEL BY RT-PCR (FLU A&B, COVID) ARPGX2
Influenza A by PCR: NEGATIVE
Influenza B by PCR: NEGATIVE
SARS Coronavirus 2 by RT PCR: NEGATIVE

## 2022-08-21 MED ORDER — INSULIN ASPART 100 UNIT/ML IJ SOLN: 0.0000 [IU] | Freq: Three times a day (TID) | INTRAMUSCULAR | Status: DC

## 2022-08-21 MED ORDER — ACETAMINOPHEN 325 MG PO TABS
650.0000 mg | ORAL_TABLET | ORAL | Status: DC | PRN
  Administered 2022-08-23: 650 mg via ORAL
  Filled 2022-08-21: qty 2

## 2022-08-21 MED ORDER — ONDANSETRON HCL 4 MG/2ML IJ SOLN
4.0000 mg | Freq: Four times a day (QID) | INTRAMUSCULAR | Status: DC | PRN
  Filled 2022-08-21: qty 2

## 2022-08-21 NOTE — ED Provider Notes (Signed)
[facility] Provider Note   CSN: [id number] Arrival date & time: 08/21/22  1205     History  Chief Complaint  Patient presents with   Loss of Consciousness    Vicki Mccoy is a 41 y.o. female.   Loss of Consciousness    41 year old female with medical history significant for diabetes mellitus on metformin who presents to the emergency department after syncopal episode.  The patient states that she has had 3 syncopal episodes over the past 2 days.  She states that she syncopized twice yesterday and once this morning.  Syncope events occurred at rest with no exertion.  She endorses lightheadedness, syncope for unknown duration.  No tongue biting or urinary incontinence.  She denies any chest pain or shortness of breath.  She denies any lower extremity swelling.  She denies any recent infectious symptoms.  No recent tick bites.  Home Medications Prior to Admission medications   Medication Sig Start Date End Date Taking? Authorizing Provider  metFORMIN (GLUCOPHAGE) 500 MG tablet Take 500 mg by mouth daily with breakfast.   Yes [provider]  lidocaine (LIDODERM) 5 % Place 1 patch onto the skin daily. Remove & Discard patch within 12 hours or as directed by MD Patient not taking: Reported on 08/21/2022 05/28/22   Henderly, Britni A, PA-C  rizatriptan (MAXALT) 5 MG tablet TAKE 1 TABLET BY MOUTH AS NEEDED FOR MIGRAINE. MAY REPEAT IN 2 HOURS IF NEEDED Patient not taking: Reported on 08/21/2022 08/08/22   Pieter Partridge, DO  topiramate (TOPAMAX) 25 MG tablet Take 1 tablet (25 mg total) by mouth daily. Patient not taking: Reported on 08/21/2022 06/08/22   Pieter Partridge, DO  ZOLMitriptan (ZOMIG) 2.5 MG tablet Take 1 tablet twice daily for 7 days beginning 2 days before first day of menses. Patient not taking: Reported on 08/21/2022 05/03/22   Pieter Partridge, DO      Allergies    Patient has no known allergies.    Review of Systems   Review of Systems   Cardiovascular:  Positive for syncope.  Neurological:  Positive for syncope and light-headedness.  All other systems reviewed and are negative.   Physical Exam Updated Vital Signs BP (!) 140/56   Pulse (!) 36   Temp 98.4 F (36.9 C) (Oral)   Resp 19   Ht '5\' 4"'$  (1.626 m)   Wt 74.4 kg   SpO2 96%   BMI 28.15 kg/m  Physical Exam Vitals and nursing note reviewed.  Constitutional:      General: She is not in acute distress.    Appearance: She is well-developed.  HENT:     Head: Normocephalic and atraumatic.  Eyes:     Conjunctiva/sclera: Conjunctivae normal.  Cardiovascular:     Rate and Rhythm: Regular rhythm. Bradycardia present.  Pulmonary:     Effort: Pulmonary effort is normal. No respiratory distress.     Breath sounds: Normal breath sounds.  Abdominal:     Palpations: Abdomen is soft.     Tenderness: There is no abdominal tenderness.  Musculoskeletal:        General: No swelling.     Cervical back: Neck supple.  Skin:    General: Skin is warm and dry.     Capillary Refill: Capillary refill takes less than 2 seconds.  Neurological:     General: No focal deficit present.     Mental Status: She is alert and oriented to person, place, and time. Mental status  is at baseline.     Cranial Nerves: No cranial nerve deficit.     Sensory: No sensory deficit.     Motor: No weakness.  Psychiatric:        Mood and Affect: Mood normal.     ED Results / Procedures / Treatments   Labs (all labs ordered are listed, but only abnormal results are displayed) Labs Reviewed  URINALYSIS, ROUTINE W REFLEX MICROSCOPIC - Abnormal; Notable for the following components:      Result Value   APPearance HAZY (*)    Hgb urine dipstick SMALL (*)    Bacteria, UA RARE (*)    All other components within normal limits  TROPONIN I (HIGH SENSITIVITY) - Abnormal; Notable for the following components:   Troponin I (High Sensitivity) 22 (*)    All other components within normal limits   TROPONIN I (HIGH SENSITIVITY) - Abnormal; Notable for the following components:   Troponin I (High Sensitivity) 30 (*)    All other components within normal limits  RESP PANEL BY RT-PCR (FLU A&B, COVID) ARPGX2  BASIC METABOLIC PANEL  CBC  PREGNANCY, URINE  TSH  T4, FREE  D-DIMER, QUANTITATIVE  HCG, SERUM, QUALITATIVE  LYME DISEASE SEROLOGY W/REFLEX  ROCKY MTN SPOTTED FVR ABS PNL(IGG+IGM)    EKG EKG Interpretation  Date/Time:  Tuesday August 21 2022 14:27:10 EDT Ventricular Rate:  36 PR Interval:    QRS Duration: 128 QT Interval:  649 QTC Calculation: 503 R Axis:   95 Text Interpretation: AV block, complete (third degree) Nonspecific intraventricular conduction delay Confirmed by Regan Lemming ([id number]) on 08/21/2022 2:38:10 PM Also confirmed by Regan Lemming ([address] [place]in [phone])  on 08/21/2022 2:38:43 PM  Radiology DG Chest 1 View  Result Date: 08/21/2022 CLINICAL DATA:  Multiple syncopal episodes. EXAM: CHEST  1 VIEW COMPARISON:  None Available. FINDINGS: Numerous EKG leads and defibrillator pads overlie the chest. The cardiomediastinal silhouette is within normal limits. No airspace consolidation, edema, sizable pleural effusion, or pneumothorax is identified. Mild S-shaped thoracic scoliosis is noted. IMPRESSION: No active disease. Electronically Signed   By: Logan Bores M.D.   On: 08/21/2022 14:45    Procedures .Critical Care  Performed by: Regan Lemming, MD Authorized by: Regan Lemming, MD   Critical care provider statement:    Critical care time (minutes):  30   Critical care was necessary to treat or prevent imminent or life-threatening deterioration of the following conditions:  Cardiac failure   Critical care was time spent personally by me on the following activities:  Development of treatment plan with patient or surrogate, discussions with consultants, evaluation of patient's response to treatment, examination of patient, ordering and review of  laboratory studies, ordering and review of radiographic studies, ordering and performing treatments and interventions, pulse oximetry, re-evaluation of patient's condition and review of old charts   Care discussed with: admitting provider       Medications Ordered in ED Medications - No data to display  ED Course/ Medical Decision Making/ A&P Clinical Course as of 08/21/22 2128  Tue Aug 21, 2022  1417 Pulse Rate(!): 36 [JL]    Clinical Course User Index [JL] Regan Lemming, MD                           Medical Decision Making Amount and/or Complexity of Data Reviewed Labs: ordered. Radiology: ordered.  Risk Decision regarding hospitalization.      41 year old female with medical history significant  for diabetes mellitus on metformin who presents to the emergency department after syncopal episode.  The patient states that she has had 3 syncopal episodes over the past 2 days.  She states that she syncopized twice yesterday and once this morning.  Syncope events occurred at rest with no exertion.  She endorses lightheadedness, syncope for unknown duration.  No tongue biting or urinary incontinence.  She denies any chest pain or shortness of breath.  She denies any lower extremity swelling.  She denies any recent infectious symptoms.  No recent tick bites.  On arrival, the patient was afebrile, bradycardic heart rates in the 30s, blood pressure 150/76, saturating 100% on room air.  Sinus bradycardia noted on cardiac telemetry.  Physical exam generally unremarkable with a normal neurologic exam.  An EKG was performed and the patient was found to be in complete heart block.  This was confirmed on repeat EKG, EKG read complete heart block third-degree, ventricular rate 3 6, no ischemic changes noted.  Chest x-ray is which revealed abnormal cardiomediastinal silhouette, no airspace consolidation, edema or pleural effusion or pneumothorax.  No active disease noted.  Pacer pads were placed on  the patient.  She remained hemodynamically stable and mentating appropriately.  Laboratory evaluation significant for a D-dimer normal, hCG normal, COVID-19 influenza PCR testing negative, CBC without leukocytosis or anemia, BMP unremarkable, TSH normal.  Urinalysis with rare bacteria otherwise negative for UTI, initial troponin 22, repeat troponin pending.  I spoke with on-call cardiology, Dr. Oval Linsey who accepted the patient in admission for further care and monitoring.  The patient remained hemodynamically stable throughout her time in the emergency department prior to admission.   Final Clinical Impression(s) / ED Diagnoses Final diagnoses:  Complete heart block ([place])  Syncope, unspecified syncope type    Rx / DC Orders ED Discharge Orders     None         Regan Lemming, MD 08/21/22 2128

## 2022-08-21 NOTE — ED Triage Notes (Signed)
Pt via pov from home with multiple syncopal episodes over the last 2 days. Pt reports that she had similar episodes in the past after a head injury, but that hasn't happened in a while. Pt reports dizziness, syncope, awakening. She lives alone with disabled child, so has no way of knowing how long she is out. She does wake up on the floor. Pt alert & oriented, nad noted.

## 2022-08-21 NOTE — H&P (Signed)
Cardiology Admission History and Physical   Patient ID: Vicki Mccoy MRN: [id number]; DOB: Dec 28, 1980   Admission date: 08/21/2022  PCP:  Trey Sailors, PA   [facility] Providers Cardiologist:  None        Chief Complaint:  Syncope  Patient Profile:   Vicki Mccoy is a 41 y.o. female with a PMHx of T2DM who is being seen 08/21/2022 for the evaluation of syncope and CHB.  History of Present Illness:   Vicki Mccoy states she was at the baseline of her health yesterday. While she was taking care of her daughter who has cerebral palsy (?), she had two episodes of syncopal events that she suddenly felt lightheaded and dizzy, and then she lost her consciousness. She says she woke up in a few seconds without any injury. She had another episode today which made her go to local ER. She was found to have a CHB and transferred here for further evaluation. Denies changes in position or dehydration during those episodes. Denies fever, chills, headache, chest discomfort, heart palpitations, dyspnea, nausea, vomiting, abdominal fullness, dysuria, diarrhea, pedal edema or any bleeding events. Currently she is on room air, still in CHB with normal BP, no acute distress and talking to her sister over the phone.   She presented to local ED this July for a similar episode that she passed out while taking a shower.  Denies hx of tobacco, alcohol or illicit drug use. Denies recent travel or any tick contact history. Denies family of sudden cardiac death. Reports her mother has a pacemaker. Reports started a new herbal medication (probiotics) approximately 2 weeks   Past Medical History:  Diagnosis Date   Allergy    Arthritis    Diabetes mellitus    Sickle cell trait ([place])     History reviewed. No pertinent surgical history.   Medications Prior to Admission: Prior to Admission medications   Medication Sig Start Date End Date Taking? Authorizing Provider  metFORMIN (GLUCOPHAGE)  500 MG tablet Take 500 mg by mouth daily with breakfast.   Yes [provider]  lidocaine (LIDODERM) 5 % Place 1 patch onto the skin daily. Remove & Discard patch within 12 hours or as directed by MD Patient not taking: Reported on 08/21/2022 05/28/22   Henderly, Britni A, PA-C  rizatriptan (MAXALT) 5 MG tablet TAKE 1 TABLET BY MOUTH AS NEEDED FOR MIGRAINE. MAY REPEAT IN 2 HOURS IF NEEDED Patient not taking: Reported on 08/21/2022 08/08/22   Pieter Partridge, DO  topiramate (TOPAMAX) 25 MG tablet Take 1 tablet (25 mg total) by mouth daily. Patient not taking: Reported on 08/21/2022 06/08/22   Pieter Partridge, DO  ZOLMitriptan (ZOMIG) 2.5 MG tablet Take 1 tablet twice daily for 7 days beginning 2 days before first day of menses. Patient not taking: Reported on 08/21/2022 05/03/22   Pieter Partridge, DO     Allergies:   No Known Allergies  Social History:   Social History   Socioeconomic History   Marital status: Single    Spouse name: n/a   Number of children: 1   Years of education: 12   Highest education level: Not on file  Occupational History   Occupation: Administrator, Civil Service: UNEMPLOYED    Comment: housekeeping  Tobacco Use   Smoking status: Never   Smokeless tobacco: Never  Substance and Sexual Activity   Alcohol use: No    Alcohol/week: 0.0 standard drinks of alcohol   Drug  use: No   Sexual activity: Not Currently    Partners: Male    Birth control/protection: Pill  Other Topics Concern   Not on file  Social History Narrative   Lives with her daughter.  Family is nearby.   Social Determinants of Health   Financial Resource Strain: Not on file  Food Insecurity: Not on file  Transportation Needs: Not on file  Physical Activity: Not on file  Stress: Not on file  Social Connections: Not on file  Intimate Partner Violence: Not on file    Family History:   The patient's family history includes Cerebral palsy in her daughter; Multiple sclerosis in her sister; Sickle  cell anemia in her mother. There is no history of Allergic rhinitis, Angioedema, Asthma, Eczema, Urticaria, or Immunodeficiency.    ROS:  Please see the history of present illness.  All other ROS reviewed and negative.     Physical Exam/Data:   Vitals:   08/21/22 1900 08/21/22 2000 08/21/22 2100 08/21/22 2158  BP: (!) 142/72 133/62 (!) 140/56 (!) 151/62  Pulse: (!) 36 (!) 37 (!) 36 (!) 49  Resp: '16 15 19 17  '$ Temp:  98.4 F (36.9 C)  (!) 97.5 F (36.4 C)  TempSrc:  Oral  Oral  SpO2: 100% 100% 96% 100%  Weight:      Height:        Intake/Output Summary (Last 24 hours) at 08/21/2022 2233 Last data filed at 08/21/2022 1623 Gross per 24 hour  Intake --  Output 275 ml  Net -275 ml      08/21/2022    1:05 PM 05/28/2022    2:36 PM 05/03/2022    9:33 AM  Last 3 Weights  Weight (lbs) 164 lb 163 lb 163 lb 6.4 oz  Weight (kg) 74.39 kg 73.936 kg 74.118 kg     Body mass index is 28.15 kg/m.  General:  Well nourished, well developed, in no acute distress HEENT: normal Neck: no JVD Vascular: No carotid bruits; Distal pulses 2+ bilaterally   Cardiac:  normal S1, S2; RRR; no murmur  Lungs:  clear to auscultation bilaterally, no wheezing, rhonchi or rales  Abd: soft, nontender, no hepatomegaly  Ext: no edema Musculoskeletal:  No deformities, BUE and BLE strength normal and equal Skin: warm and dry  Neuro:  CNs 2-12 intact, no focal abnormalities noted Psych:  Normal affect    Relevant CV Studies:   Laboratory Data:  High Sensitivity Troponin:   Recent Labs  Lab 08/21/22 1320 08/21/22 1726  TROPONINIHS 22* 30*      Chemistry Recent Labs  Lab 08/21/22 1320  NA 139  K 4.2  CL 106  CO2 23  GLUCOSE 92  BUN 14  CREATININE 0.79  CALCIUM 9.7  GFRNONAA >60  ANIONGAP 10    No results for input(s): "PROT", "ALBUMIN", "AST", "ALT", "ALKPHOS", "BILITOT" in the last 168 hours. Lipids No results for input(s): "CHOL", "TRIG", "HDL", "LABVLDL", "LDLCALC", "CHOLHDL" in the  last 168 hours. Hematology Recent Labs  Lab 08/21/22 1320  WBC 4.5  RBC 4.80  HGB 13.2  HCT 39.7  MCV 82.7  MCH 27.5  MCHC 33.2  RDW 13.8  PLT 328   Thyroid  Recent Labs  Lab 08/21/22 1433  TSH 0.765  FREET4 1.00   BNPNo results for input(s): "BNP", "PROBNP" in the last 168 hours.  DDimer  Recent Labs  Lab 08/21/22 1440  DDIMER 0.45     Radiology/Studies:  DG Chest 1 View  Result  Date: 08/21/2022 CLINICAL DATA:  Multiple syncopal episodes. EXAM: CHEST  1 VIEW COMPARISON:  None Available. FINDINGS: Numerous EKG leads and defibrillator pads overlie the chest. The cardiomediastinal silhouette is within normal limits. No airspace consolidation, edema, sizable pleural effusion, or pneumothorax is identified. Mild S-shaped thoracic scoliosis is noted. IMPRESSION: No active disease. Electronically Signed   By: Logan Bores M.D.   On: 08/21/2022 14:45     Assessment and Plan:   #Syncope #Complete heart block -unknown etiology, denies family hx of SCD or tick contact. ECG on admission demonstrated sinus tachycardia with junctional bradycardia. Fortunately her BP currently is normal/high. -Monitor on telemetry, place zoll patch -Obtain TTE. If not revealing, would recommend a cMRI given her age. -keep atropine at bedside for unstable bradycardia.  Avoid other medications could potentially cause bradycardia. If bradycardia persists can try low dose dopamine gtt if clinically appropriate, though it may not be helpful given high grade conduction disease.  -Given the burden of symptomatic CHB, she is a candidate for PPM -keep her NPO p MN  #T2DM -insulin sliding scale  Risk Assessment/Risk Scores:    Severity of Illness: The appropriate patient status for this patient is INPATIENT. Inpatient status is judged to be reasonable and necessary in order to provide the required intensity of service to ensure the patient's safety. The patient's presenting symptoms, physical exam  findings, and initial radiographic and laboratory data in the context of their chronic comorbidities is felt to place them at high risk for further clinical deterioration. Furthermore, it is not anticipated that the patient will be medically stable for discharge from the hospital within 2 midnights of admission.   * I certify that at the point of admission it is my clinical judgment that the patient will require inpatient hospital care spanning beyond 2 midnights from the point of admission due to high intensity of service, high risk for further deterioration and high frequency of surveillance required.*   For questions or updates, please contact [place] Please consult [email or web address] for contact info under     Signed, Laurice Record, MD  08/21/2022 10:33 PM

## 2022-08-22 ENCOUNTER — Encounter (HOSPITAL_COMMUNITY)
Admission: EM | Disposition: A | Discharge status: Home / Self Care | Payer: Self-pay | Source: Home / Self Care | Attending: Internal Medicine

## 2022-08-22 ENCOUNTER — Inpatient Hospital Stay (HOSPITAL_COMMUNITY): Payer: Medicare HMO

## 2022-08-22 DIAGNOSIS — I442 Atrioventricular block, complete: Secondary | ICD-10-CM

## 2022-08-22 DIAGNOSIS — D8685 Sarcoid myocarditis: Secondary | ICD-10-CM | POA: Diagnosis not present

## 2022-08-22 DIAGNOSIS — R001 Bradycardia, unspecified: Secondary | ICD-10-CM | POA: Diagnosis not present

## 2022-08-22 HISTORY — PX: ICD IMPLANT: EP1208

## 2022-08-22 LAB — SURGICAL PCR SCREEN
MRSA, PCR: NEGATIVE
Staphylococcus aureus: NEGATIVE

## 2022-08-22 LAB — ECHOCARDIOGRAM COMPLETE
AR max vel: 2.54 cm2
AV Area VTI: 2.56 cm2
AV Area mean vel: 2.5 cm2
AV Mean grad: 4 mmHg
AV Peak grad: 7.7 mmHg
Ao pk vel: 1.39 m/s
Area-P 1/2: 3.37 cm2
Height: 64 in
MV M vel: 4.64 m/s
MV Peak grad: 86 mmHg
S' Lateral: 3.4 cm
Weight: 2624 oz

## 2022-08-22 LAB — GLUCOSE, CAPILLARY
Glucose-Capillary: 85 mg/dL (ref 70–99)
Glucose-Capillary: 93 mg/dL (ref 70–99)
Glucose-Capillary: 99 mg/dL (ref 70–99)

## 2022-08-22 LAB — HEMOGLOBIN A1C
Hgb A1c MFr Bld: 5.7 % — ABNORMAL HIGH (ref 4.8–5.6)
Mean Plasma Glucose: 116.89 mg/dL

## 2022-08-22 LAB — ROCKY MTN SPOTTED FVR ABS PNL(IGG+IGM)
RMSF IgG: NEGATIVE
RMSF IgM: 0.38 index (ref 0.00–0.89)

## 2022-08-22 LAB — HIV ANTIBODY (ROUTINE TESTING W REFLEX): HIV Screen 4th Generation wRfx: NONREACTIVE

## 2022-08-22 LAB — LYME DISEASE SEROLOGY W/REFLEX: Lyme Total Antibody EIA: NEGATIVE

## 2022-08-22 SURGERY — ICD IMPLANT

## 2022-08-22 MED ORDER — GADOBUTROL 1 MMOL/ML IV SOLN
10.0000 mL | Freq: Once | INTRAVENOUS | Status: AC | PRN
  Administered 2022-08-22: 10 mL via INTRAVENOUS

## 2022-08-22 MED ORDER — LIDOCAINE HCL 1 % IJ SOLN
INTRAMUSCULAR | Status: AC
  Filled 2022-08-22: qty 60

## 2022-08-22 MED ORDER — CEFAZOLIN SODIUM-DEXTROSE 2-4 GM/100ML-% IV SOLN
INTRAVENOUS | Status: AC
  Filled 2022-08-22: qty 100

## 2022-08-22 MED ORDER — HEPARIN (PORCINE) IN NACL 1000-0.9 UT/500ML-% IV SOLN
INTRAVENOUS | Status: DC | PRN
  Administered 2022-08-22: 500 mL

## 2022-08-22 MED ORDER — SODIUM CHLORIDE 0.9 % IV SOLN: INTRAVENOUS | Status: DC

## 2022-08-22 MED ORDER — LIDOCAINE HCL (PF) 1 % IJ SOLN
INTRAMUSCULAR | Status: DC | PRN
  Administered 2022-08-22: 60 mL

## 2022-08-22 MED ORDER — SODIUM CHLORIDE 0.9 % IV SOLN
INTRAVENOUS | Status: AC
  Filled 2022-08-22: qty 2

## 2022-08-22 MED ORDER — FENTANYL CITRATE (PF) 100 MCG/2ML IJ SOLN
INTRAMUSCULAR | Status: DC | PRN
  Administered 2022-08-22: 50 ug via INTRAVENOUS

## 2022-08-22 MED ORDER — PREDNISONE 20 MG PO TABS
40.0000 mg | ORAL_TABLET | Freq: Every day | ORAL | Status: DC
  Administered 2022-08-23: 40 mg via ORAL
  Filled 2022-08-22: qty 2

## 2022-08-22 MED ORDER — CHLORHEXIDINE GLUCONATE 4 % EX LIQD
60.0000 mL | Freq: Once | CUTANEOUS | Status: AC
  Administered 2022-08-22: 4 via TOPICAL
  Filled 2022-08-22: qty 60

## 2022-08-22 MED ORDER — SODIUM CHLORIDE 0.9 % IV SOLN
80.0000 mg | INTRAVENOUS | Status: AC
  Administered 2022-08-22: 80 mg
  Filled 2022-08-22: qty 2

## 2022-08-22 MED ORDER — MIDAZOLAM HCL 5 MG/5ML IJ SOLN
INTRAMUSCULAR | Status: AC
  Filled 2022-08-22: qty 5

## 2022-08-22 MED ORDER — HEPARIN (PORCINE) IN NACL 1000-0.9 UT/500ML-% IV SOLN
INTRAVENOUS | Status: AC
  Filled 2022-08-22: qty 500

## 2022-08-22 MED ORDER — CEFAZOLIN SODIUM-DEXTROSE 1-4 GM/50ML-% IV SOLN
1.0000 g | Freq: Four times a day (QID) | INTRAVENOUS | Status: AC
  Administered 2022-08-22 – 2022-08-23 (×3): 1 g via INTRAVENOUS
  Filled 2022-08-22 (×4): qty 50

## 2022-08-22 MED ORDER — CEFAZOLIN SODIUM-DEXTROSE 2-4 GM/100ML-% IV SOLN
2.0000 g | INTRAVENOUS | Status: AC
  Administered 2022-08-22: 2 g via INTRAVENOUS

## 2022-08-22 MED ORDER — MIDAZOLAM HCL 5 MG/5ML IJ SOLN
INTRAMUSCULAR | Status: DC | PRN
  Administered 2022-08-22: 1 mg via INTRAVENOUS

## 2022-08-22 MED ORDER — FENTANYL CITRATE (PF) 100 MCG/2ML IJ SOLN
INTRAMUSCULAR | Status: AC
  Filled 2022-08-22: qty 2

## 2022-08-22 MED ORDER — CHLORHEXIDINE GLUCONATE 4 % EX LIQD
60.0000 mL | Freq: Once | CUTANEOUS | Status: DC
  Filled 2022-08-22: qty 60

## 2022-08-22 SURGICAL SUPPLY — 8 items
CABLE SURGICAL S-101-97-12 (CABLE) ×1 IMPLANT
ICD GALLANT DR CDDRA500Q (ICD Generator) IMPLANT
KIT MICROPUNCTURE NIT STIFF (SHEATH) IMPLANT
LEAD DURATA 7122Q-58CM (Lead) IMPLANT
LEAD ULTIPACE 52 LPA1231/52 (Lead) IMPLANT
PAD DEFIB RADIO PHYSIO CONN (PAD) ×1 IMPLANT
SHEATH 7FR PRELUDE SNAP 13 (SHEATH) IMPLANT
TRAY PACEMAKER INSERTION (PACKS) ×1 IMPLANT

## 2022-08-22 NOTE — Consult Note (Signed)
NAME:  Vicki Mccoy, MRN:  [id number], DOB:  [birth year], LOS: 1 ADMISSION DATE:  08/21/2022, CONSULTATION DATE:  10/18 REFERRING MD:  Mealor, CHIEF COMPLAINT:  sarcoidosis    History of Present Illness:  41 year old female patient was admitted on 10/17 after having episode of syncope at home.  In the emergency room found to be in complete heart block.  Apparently leading up to events was at home, had sudden onset of lightheadedness, dizzy, then lost consciousness falling to the floor.  No these episodes were witnessed.  Her daughter is nonverbal with cerebral palsy.  She had a another episode the day of presentation which prompted her to visit the emergency room.  She was admitted to the cardiac service.  She underwent cardiac MRI on 10/18 which showed normal LV size with EF 63% normal RV size there was appearance of nodular and circumscribed areas of enhancement concerning for cardiac sarcoidosis.  Because of this potential diagnosis pulmonary was asked to evaluate.  Pertinent  Medical History  Type 2 diabetes arthritis sickle cell trait seasonal allergies  Significant Hospital Events: Including procedures, antibiotic start and stop dates in addition to other pertinent events   10/17 admitted for CHB.  10/18 cardiac MRI suggesting possibly cardiac sarcoid.   Interim History / Subjective:  No distress feels "fine"  Objective   Blood pressure (Abnormal) 122/59, pulse (Abnormal) 36, temperature 98 F (36.7 C), temperature source Oral, resp. rate 14, height '5\' 4"'$  (1.626 m), weight 74.4 kg, SpO2 100 %.        Intake/Output Summary (Last 24 hours) at 08/22/2022 1413 Last data filed at 08/22/2022 1023 Gross per 24 hour  Intake no documentation  Output 675 ml  Net -675 ml   Filed Weights   08/21/22 1305  Weight: 74.4 kg    Examination: General: 41 year old female resting in bed No distress  HENT: NCAT no JVD  Lungs: clear no accessory use  Cardiovascular: RRR Abdomen: soft not  tender  Extremities: warm and dry  Neuro: intact  GU: voids   Resolved Hospital Problem list     Assessment & Plan:  Cardiac Sarcoid w/ associated CHB.  At this point EF 63%  No radiographic pulm involvement from plain CXR Plan For PPM today Will f/u CT chest Will go ahead and initiate pred 0.'5mg'$ /kg/d (pred '40mg'$ ) at week 4 can decrease by '10mg'$ /d every 4 weeks until at '10mg'$ /d vs transition to steroid sparing agent   Best Practice (right click and "Reselect all SmartList Selections" daily)  Per primary   Labs   CBC: Recent Labs  Lab 08/21/22 1320  WBC 4.5  HGB 13.2  HCT 39.7  MCV 82.7  PLT 409    Basic Metabolic Panel: Recent Labs  Lab 08/21/22 1320  NA 139  K 4.2  CL 106  CO2 23  GLUCOSE 92  BUN 14  CREATININE 0.79  CALCIUM 9.7   GFR: Estimated Creatinine Clearance: 89.6 mL/min (by C-G formula based on SCr of 0.79 mg/dL). Recent Labs  Lab 08/21/22 1320  WBC 4.5    Liver Function Tests: No results for input(s): "AST", "ALT", "ALKPHOS", "BILITOT", "PROT", "ALBUMIN" in the last 168 hours. No results for input(s): "LIPASE", "AMYLASE" in the last 168 hours. No results for input(s): "AMMONIA" in the last 168 hours.  ABG No results found for: "PHART", "PCO2ART", "PO2ART", "HCO3", "TCO2", "ACIDBASEDEF", "O2SAT"   Coagulation Profile: No results for input(s): "INR", "PROTIME" in the last 168 hours.  Cardiac Enzymes: No results  for input(s): "CKTOTAL", "CKMB", "CKMBINDEX", "TROPONINI" in the last 168 hours.  HbA1C: Hgb A1c MFr Bld  Date/Time Value Ref Range Status  08/22/2022 12:45 AM 5.7 (H) 4.8 - 5.6 % Final    Comment:    REPEATED TO VERIFY (NOTE) Pre diabetes:          5.7%-6.4%  Diabetes:              >6.4%  Glycemic control for   <7.0% adults with diabetes   09/20/2010 11:28 AM 6.1 %     CBG: Recent Labs  Lab 08/22/22 0659  GLUCAP 85    Review of Systems:   Review of Systems  Constitutional: Negative.   HENT: Negative.    Eyes:   Positive for blurred vision.  Respiratory: Negative.    Cardiovascular:  Negative for chest pain and leg swelling.  Gastrointestinal: Negative.   Genitourinary: Negative.   Musculoskeletal: Negative.   Skin:  Negative for rash.  Endo/Heme/Allergies: Negative.      Past Medical History:  She,  has a past medical history of Allergy, Arthritis, Diabetes mellitus, and Sickle cell trait ([place]).   Surgical History:  History reviewed. No pertinent surgical history.   Social History:   reports that she has never smoked. She has never used smokeless tobacco. She reports that she does not drink alcohol and does not use drugs.   Family History:  Her family history includes Cerebral palsy in her daughter; Multiple sclerosis in her sister; Sickle cell anemia in her mother. There is no history of Allergic rhinitis, Angioedema, Asthma, Eczema, Urticaria, or Immunodeficiency.   Allergies No Known Allergies   Home Medications  Prior to Admission medications   Medication Sig Start Date End Date Taking? Authorizing Provider  metFORMIN (GLUCOPHAGE) 500 MG tablet Take 500 mg by mouth daily with breakfast.   Yes [provider]  lidocaine (LIDODERM) 5 % Place 1 patch onto the skin daily. Remove & Discard patch within 12 hours or as directed by MD Patient not taking: Reported on 08/21/2022 05/28/22   Henderly, Britni A, PA-C  rizatriptan (MAXALT) 5 MG tablet TAKE 1 TABLET BY MOUTH AS NEEDED FOR MIGRAINE. MAY REPEAT IN 2 HOURS IF NEEDED Patient not taking: Reported on 08/21/2022 08/08/22   Pieter Partridge, DO  topiramate (TOPAMAX) 25 MG tablet Take 1 tablet (25 mg total) by mouth daily. Patient not taking: Reported on 08/21/2022 06/08/22   Pieter Partridge, DO  ZOLMitriptan (ZOMIG) 2.5 MG tablet Take 1 tablet twice daily for 7 days beginning 2 days before first day of menses. Patient not taking: Reported on 08/21/2022 05/03/22   Pieter Partridge, DO     Critical care time: NA    Erick Colace  ACNP-BC [place] Pager # [phone] OR # [phone] if no answer

## 2022-08-22 NOTE — Consult Note (Signed)
ELECTROPHYSIOLOGY CONSULT NOTE    Patient ID: Vicki Mccoy MRN: [id number], DOB/AGE: [birth year] 41 y.o.  Admit date: 08/21/2022 Date of Consult: 08/22/2022  Primary Physician: Trey Sailors, PA Primary Cardiologist: None  Electrophysiologist: New   Referring Provider: Dr. Alfred Levins  Patient Profile: Vicki Mccoy is a 41 y.o. female with a history of DM2 who is being seen today for the evaluation of CHB at the request of Dr. Alfred Levins.  HPI:  Vicki Mccoy is a 41 y.o. female with medical history as above.   She had syncopal episode in July with normal work up; was felt to be vasovagal in setting of hot shower.   She presented to [facility] ED with reports of syncope, and was found to have CHB on the monitor.  Pt lives at home with her daughter who has cerebral palsy and is completely dependent on her (dressing, bathing, feeding).  She had several episodes  10/16 where she suddenly felt lightheadedness and dizzy, and then lost consciousness, falling to the floor. Neither episode was witnessed, and her daughter is non-verbal, so she is not sure how long she was out.    She had another episode 10/17 which prompted evaluation.   Potassium4.2 (10/17 1320) Creatinine, ser  0.79 (10/17 1320) PLT  328 (10/17 1320) HGB  13.2 (10/17 1320) WBC 4.5 (10/17 1320) Troponin I (High Sensitivity)30* (10/17 1726).    Today, she is feeling OK at rest. She denies fevers, chills, headache, recent illness, chest discomfort, heart palpitations, SOB, N/V, or bleeding.  She states her mother has a PPM but she is not sure of the circumstances. Denies family history of sudden cardiac death.  Grandmother is also present in the room. She denies known tick contact or recent travel.  Past Medical History:  Diagnosis Date   Allergy    Arthritis    Diabetes mellitus    Sickle cell trait ([place])      Surgical History: History reviewed. No pertinent surgical history.   Medications Prior to Admission   Medication Sig Dispense Refill Last Dose   metFORMIN (GLUCOPHAGE) 500 MG tablet Take 500 mg by mouth daily with breakfast.   08/21/2022   lidocaine (LIDODERM) 5 % Place 1 patch onto the skin daily. Remove & Discard patch within 12 hours or as directed by MD (Patient not taking: Reported on 08/21/2022) 30 patch 0 Not Taking   rizatriptan (MAXALT) 5 MG tablet TAKE 1 TABLET BY MOUTH AS NEEDED FOR MIGRAINE. MAY REPEAT IN 2 HOURS IF NEEDED (Patient not taking: Reported on 08/21/2022) 10 tablet 0 Not Taking   topiramate (TOPAMAX) 25 MG tablet Take 1 tablet (25 mg total) by mouth daily. (Patient not taking: Reported on 08/21/2022) 30 tablet 0 Not Taking   ZOLMitriptan (ZOMIG) 2.5 MG tablet Take 1 tablet twice daily for 7 days beginning 2 days before first day of menses. (Patient not taking: Reported on 08/21/2022) 14 tablet 5 Not Taking    Inpatient Medications:   insulin aspart  0-9 Units Subcutaneous TID WC    Allergies: No Known Allergies  Social History   Socioeconomic History   Marital status: Single    Spouse name: n/a   Number of children: 1   Years of education: 12   Highest education level: Not on file  Occupational History   Occupation: Administrator, Civil Service: UNEMPLOYED    Comment: housekeeping  Tobacco Use   Smoking status: Never   Smokeless tobacco: Never  Substance and Sexual Activity  Alcohol use: No    Alcohol/week: 0.0 standard drinks of alcohol   Drug use: No   Sexual activity: Not Currently    Partners: Male    Birth control/protection: Pill  Other Topics Concern   Not on file  Social History Narrative   Lives with her daughter.  Family is nearby.   Social Determinants of Health   Financial Resource Strain: Not on file  Food Insecurity: Not on file  Transportation Needs: Not on file  Physical Activity: Not on file  Stress: Not on file  Social Connections: Not on file  Intimate Partner Violence: Not on file     Family History  Problem Relation Age of  Onset   Sickle cell anemia Mother    Multiple sclerosis Sister    Cerebral palsy Daughter    Allergic rhinitis Neg Hx    Angioedema Neg Hx    Asthma Neg Hx    Eczema Neg Hx    Urticaria Neg Hx    Immunodeficiency Neg Hx      Review of Systems: All other systems reviewed and are otherwise negative except as noted above.  Physical Exam: Vitals:   08/21/22 2158 08/21/22 2346 08/22/22 0356 08/22/22 0752  BP: (!) 151/62 112/69 114/70 126/63  Pulse: (!) 49 (!) 48 (!) 35 (!) 36  Resp: '17 17 17 17  '$ Temp: (!) 97.5 F (36.4 C) 97.9 F (36.6 C) 97.8 F (36.6 C) 97.6 F (36.4 C)  TempSrc: Oral Oral Oral Oral  SpO2: 100% 100% 98% 100%  Weight:      Height:        GEN- The patient is well appearing, alert and oriented x 3 today.   HEENT: normocephalic, atraumatic; sclera clear, conjunctiva pink; hearing intact; oropharynx clear; neck supple Lungs- Clear to ausculation bilaterally, normal work of breathing.  No wheezes, rales, rhonchi Heart- Regular rate and rhythm, no murmurs, rubs or gallops GI- soft, non-tender, non-distended, bowel sounds present Extremities- no clubbing, cyanosis, or edema; DP/PT/radial pulses 2+ bilaterally MS- no significant deformity or atrophy Skin- warm and dry, no rash or lesion Psych- euthymic mood, full affect Neuro- strength and sensation are intact  Labs:   Lab Results  Component Value Date   WBC 4.5 08/21/2022   HGB 13.2 08/21/2022   HCT 39.7 08/21/2022   MCV 82.7 08/21/2022   PLT 328 08/21/2022    Recent Labs  Lab 08/21/22 1320  NA 139  K 4.2  CL 106  CO2 23  BUN 14  CREATININE 0.79  CALCIUM 9.7  GLUCOSE 92      Radiology/Studies: DG Chest 1 View  Result Date: 08/21/2022 CLINICAL DATA:  Multiple syncopal episodes. EXAM: CHEST  1 VIEW COMPARISON:  None Available. FINDINGS: Numerous EKG leads and defibrillator pads overlie the chest. The cardiomediastinal silhouette is within normal limits. No airspace consolidation, edema,  sizable pleural effusion, or pneumothorax is identified. Mild S-shaped thoracic scoliosis is noted. IMPRESSION: No active disease. Electronically Signed   By: Logan Bores M.D.   On: 08/21/2022 14:45    EKG: on arrival shows CHB in 30s with relatively narrow escape ~128 ms (personally reviewed)  TELEMETRY: CHB 30-40s (personally reviewed)  Assessment/Plan: 1.  CHB No clear cause at this time Lyme serologies pending. Echo pending Would recommend cMRI given her young age.  If EF down, potentially will need to consider ischemic work up, though no s/s ischemia thus far.  Explained risks, benefits, and alternatives to PPM implantation, including but not limited to  bleeding, infection, pneumothorax, pericardial effusion, lead dislodgement, heart attack, stroke, or death.  Pt verbalized understanding and agrees to proceed if indicated.  2. DM2 Hgb A1c 5.7 on metformin. SSI ordered.  Dr. Myles Gip to see.  We have arranged for cMRI, hopefully this morning.    For questions or updates, please contact [place] Please consult [email or web address] for contact info under Cardiology/STEMI.  Jacalyn Lefevre, PA-C  08/22/2022 9:42 AM

## 2022-08-22 NOTE — TOC Progression Note (Addendum)
Transition of Care [facility]) - Progression Note    Patient Details  Name: Vicki Mccoy MRN: [id number] Date of Birth: 07/12/1981  Transition of Care [facility]) CM/SW Contact  Zenon Mayo, RN Phone Number: 08/22/2022, 2:19 PM  Clinical Narrative:    From home, her  43 yo daughter has cerebral palsy,  patient in with syncopal episode at home, npo for cardiac MRI today, may do a pace maker after that, indep.  TOC following.        Expected Discharge Plan and Services                                                 Social Determinants of Health (SDOH) Interventions    Readmission Risk Interventions     No data to display

## 2022-08-22 NOTE — Progress Notes (Signed)
NS '@50ml'$ /hr set up on bilateral arms per PA request.

## 2022-08-22 NOTE — Progress Notes (Signed)
   08/22/22 0752  Assess: MEWS Score  Temp 97.6 F (36.4 C)  BP 126/63  MAP (mmHg) 79  Pulse Rate (!) 36  ECG Heart Rate (!) 39  Resp 17  SpO2 100 %  O2 Device Room Air  Patient Activity (if Appropriate) In bed  Assess: MEWS Score  MEWS Temp 0  MEWS Systolic 0  MEWS Pulse 2  MEWS RR 0  MEWS LOC 0  MEWS Score 2  MEWS Score Color Yellow  Assess: if the MEWS score is Yellow or Red  Were vital signs taken at a resting state? Yes  Focused Assessment No change from prior assessment  Does the patient meet 2 or more of the SIRS criteria? No  MEWS guidelines implemented *See Row Information* Yes  Treat  MEWS Interventions Other (Comment) (EP at bedside)  Pain Scale 0-10  Pain Score 0  Take Vital Signs  Increase Vital Sign Frequency  Yellow: Q 2hr X 2 then Q 4hr X 2, if remains yellow, continue Q 4hrs  Escalate  MEWS: Escalate Yellow: discuss with charge nurse/RN and consider discussing with provider and RRT  Notify: Charge Nurse/RN  Name of Charge Nurse/RN Notified Glory Buff  Date Charge Nurse/RN Notified 08/22/22  Time Charge Nurse/RN Notified 0810  Document  Patient Outcome Other (Comment) (EP at bedside; patient stable)  Assess: SIRS CRITERIA  SIRS Temperature  0  SIRS Pulse 0  SIRS Respirations  0  SIRS WBC 1  SIRS Score Sum  1

## 2022-08-23 ENCOUNTER — Inpatient Hospital Stay (HOSPITAL_COMMUNITY): Payer: Medicare HMO

## 2022-08-23 ENCOUNTER — Encounter (HOSPITAL_COMMUNITY): Payer: Self-pay | Admitting: Cardiovascular Disease

## 2022-08-23 ENCOUNTER — Telehealth: Payer: Self-pay | Admitting: Pulmonary Disease

## 2022-08-23 LAB — GLUCOSE, CAPILLARY
Glucose-Capillary: 68 mg/dL — ABNORMAL LOW (ref 70–99)
Glucose-Capillary: 115 mg/dL — ABNORMAL HIGH (ref 70–99)
Glucose-Capillary: 97 mg/dL (ref 70–99)

## 2022-08-23 MED ORDER — LORATADINE 10 MG PO TABS
10.0000 mg | ORAL_TABLET | Freq: Every day | ORAL | Status: DC
  Administered 2022-08-23: 10 mg via ORAL
  Filled 2022-08-23: qty 1

## 2022-08-23 MED ORDER — ACETAMINOPHEN 325 MG PO TABS: 650.0000 mg | ORAL_TABLET | ORAL | Status: AC | PRN

## 2022-08-23 MED ORDER — PREDNISONE 20 MG PO TABS: ORAL_TABLET | ORAL | 0 refills | Status: AC

## 2022-08-23 NOTE — Consult Note (Signed)
   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   For additional questions or referrals please contact:    Plan: Will sign off.    Natividad Brood, RN BSN [place]  [phone] business mobile phone Toll free office [phone]  *[place]  [phone] Fax number: [phone] [email or web address]

## 2022-08-23 NOTE — Telephone Encounter (Signed)
Recall placed as a reminder to call patient when [facility] schedule is made.

## 2022-08-23 NOTE — Telephone Encounter (Signed)
PCCM:  Please schedule appt with Dr. Loanne Drilling  Next available consult slot   Garner Nash, DO [facility] 08/23/2022 11:35 AM

## 2022-08-23 NOTE — Discharge Instructions (Signed)
After Your ICD (Implantable Cardiac Defibrillator)   You have a Abbott ICD  ACTIVITY Do not lift your arm above shoulder height for 1 week after your procedure. After 7 days, you may progress as below.  You should remove your sling 24 hours after your procedure, unless otherwise instructed by your provider.     Thursday August 30, 2022  Friday August 31, 2022 Saturday September 01, 2022 Sunday September 02, 2022   Do not lift, push, pull, or carry anything over 10 pounds with the affected arm until 6 weeks (Thursday October 04, 2022 ) after your procedure.   You may drive AFTER your wound check, unless you have been told otherwise by your provider.   Ask your healthcare provider when you can go back to work   INCISION/Dressing If you are on a blood thinner such as Coumadin, Xarelto, Eliquis, Plavix, or Pradaxa please confirm with your provider when this should be resumed.   If large square, outer bandage is left in place, this can be removed after 24 hours from your procedure. Do not remove steri-strips or glue as below.   Monitor your defibrillator site for redness, swelling, and drainage. Call the device clinic at [phone] if you experience these symptoms or fever/chills.  If your incision is sealed with Steri-strips or staples, you may shower 7 days after your procedure or when told by your provider. Do not remove the steri-strips or let the shower hit directly on your site. You may wash around your site with soap and water.    If you were discharged in a sling, please do not wear this during the day more than 48 hours after your surgery unless otherwise instructed. This may increase the risk of stiffness and soreness in your shoulder.   Avoid lotions, ointments, or perfumes over your incision until it is well-healed.  You may use a hot tub or a pool AFTER your wound check appointment if the incision is completely closed.  Your ICD is designed to protect you from life  threatening heart rhythms. Because of this, you may receive a shock.   1 shock with no symptoms:  Call the office during business hours. 1 shock with symptoms (chest pain, chest pressure, dizziness, lightheadedness, shortness of breath, overall feeling unwell):  Call 911. If you experience 2 or more shocks in 24 hours:  Call 911. If you receive a shock, you should not drive for 6 months per the [place] DMV IF you receive appropriate therapy from your ICD.   ICD Alerts:  Some alerts are vibratory and others beep. These are NOT emergencies. Please call our office to let us know. If this occurs at night or on weekends, it can wait until the next business day. Send a remote transmission.  If your device is capable of reading fluid status (for heart failure), you will be offered monthly monitoring to review this with you.   DEVICE MANAGEMENT Remote monitoring is used to monitor your ICD from home. This monitoring is scheduled every 91 days by our office. It allows [place] to keep an eye on the functioning of your device to ensure it is working properly. You will routinely see your Electrophysiologist annually (more often if necessary).   You should receive your ID card for your new device in 4-8 weeks. Keep this card with you at all times once received. Consider wearing a medical alert bracelet or necklace.  Your ICD  may be MRI compatible. This will be discussed at your next  office visit/wound check.  You should avoid contact with strong electric or magnetic fields.   Do not use amateur (ham) radio equipment or electric (arc) welding torches. MP3 player headphones with magnets should not be used. Some devices are safe to use if held at least 12 inches (30 cm) from your defibrillator. These include power tools, lawn mowers, and speakers. If you are unsure if something is safe to use, ask your health care provider.  When using your cell phone, hold it to the ear that is on the opposite side from the  defibrillator. Do not leave your cell phone in a pocket over the defibrillator.  You may safely use electric blankets, heating pads, computers, and microwave ovens.  Call the office right away if: You have chest pain. You feel more than one shock. You feel more short of breath than you have felt before. You feel more light-headed than you have felt before. Your incision starts to open up.  This information is not intended to replace advice given to you by your health care provider. Make sure you discuss any questions you have with your health care provider.

## 2022-08-23 NOTE — Progress Notes (Signed)
  Pt seen this am and wound care and arm restrictions reviewed.   CXR appears stable. Of note, she is not currently dependent on her device.   High res Chest CT pending.   Pending pulm recommendations, potentially plan discharge later today.   Full note pending disposition.   Legrand Como [address]" [place], PA-C  08/23/2022 8:19 AM

## 2022-08-23 NOTE — Discharge Summary (Signed)
ELECTROPHYSIOLOGY PROCEDURE DISCHARGE SUMMARY    Patient ID: VIA ROSADO,  MRN: [id number], DOB/AGE: 1981/11/05 41 y.o.  Admit date: 08/21/2022 Discharge date: 08/23/2022  Primary Care Physician: Trey Sailors, PA  Primary Cardiologist: None  Electrophysiologist: Dr. Myles Gip    Primary Diagnosis:  CHB Sarcoidosis, new diagnosis  Secondary Diagnosis: DM2  No Known Allergies   Procedures This Admission:  Echocardiogram 08/22/22 showed LVEF 55% and normal RV function.  Cardiac MRI 08/22/2022 showed LVEF 63%, RVEF 63%, Nodular mid-wall LGE in the mid septum and mid anterior wall concerning for sarcoidosis, with borderline elevated T2 signal in the mid septum Implantation of a Abbott dual chamber ICD on 08/22/2022 by Dr. Myles Gip.  The patient received a Abbott Gallant [id number] (dual)  with Abbott Ultipace 1231-52 right atrial lead and Abbott Durata 7122 right ventricular lead. Pt did not require an LV lead. DFTs were deferred at time of implant There were no post procedure complications CXR on 70/96/28 demonstrated no pneumothorax status post device implantation.      Brief HPI: Vicki Mccoy is a 41 y.o. female was admitted for syncope and found to have CHB. Consulted EP for consideration of pacemaker implantation, Given young age, Echo and cMRI were performed as below. Past medical history includes above.  The patient has CHB and cMRI findings consistent with sarcoidosis. Risks, benefits, and alternatives to ICD implantation were reviewed with the patient who wished to proceed.   Hospital Course:  The patient was admitted with CHB. Work up as above revealed sarcoidosis with cardiac manifestations. She underwent implantation of a Abbott dual chamber ICD with details as outlined above. Pulmonology was consulted who recommend CT chest and close follow up, as well as steroid taper given presentation. Patient was monitored on telemetry overnight which demonstrated  only  occasionally pacing and some return of her conduction  .  Left chest was without hematoma or ecchymosis.  The device was interrogated and found to be functioning normally.  CXR was obtained and demonstrated no pneumothorax status post device implantation..  Wound care, arm mobility, and restrictions were reviewed with the patient.  The patient was examined and considered stable for discharge to home.   The patient does not have LV dysfunction and does not currently have an indication for ACE-I/ARB/ARNI or beta blocker.   Anticoagulation resumption This patient is not on anticoagulation.  Physical Exam: Vitals:   08/22/22 2030 08/23/22 0258 08/23/22 0622 08/23/22 1131  BP: 134/75  133/67 137/61  Pulse: (!) 58  (!) 55   Resp: '19  14 20  '$ Temp: 98.1 F (36.7 C)  98.1 F (36.7 C) 98.3 F (36.8 C)  TempSrc: Oral  Oral Oral  SpO2: 99%  99%   Weight:  70.5 kg    Height:        GEN- The patient is well appearing, alert and oriented x 3 today.   HEENT: normocephalic, atraumatic; sclera clear, conjunctiva pink; hearing intact; oropharynx clear; neck supple, no JVP Lymph- no cervical lymphadenopathy Lungs- Clear to ausculation bilaterally, normal work of breathing.  No wheezes, rales, rhonchi Heart- Regular rate and rhythm, no murmurs, rubs or gallops, PMI not laterally displaced GI- soft, non-tender, non-distended, bowel sounds present, no hepatosplenomegaly Extremities- no clubbing, cyanosis, or edema; DP/PT/radial pulses 2+ bilaterally MS- no significant deformity or atrophy Skin- warm and dry, no rash or lesion. ICD site stable. Psych- euthymic mood, full affect Neuro- strength and sensation are intact   Labs:   Lab Results  Component Value Date   WBC 4.5 08/21/2022   HGB 13.2 08/21/2022   HCT 39.7 08/21/2022   MCV 82.7 08/21/2022   PLT 328 08/21/2022    Recent Labs  Lab 08/21/22 1320  NA 139  K 4.2  CL 106  CO2 23  BUN 14  CREATININE 0.79  CALCIUM 9.7  GLUCOSE 92     Discharge Medications:  Allergies as of 08/23/2022   No Known Allergies      Medication List     TAKE these medications    acetaminophen 325 MG tablet Commonly known as: TYLENOL Take 2 tablets (650 mg total) by mouth every 4 (four) hours as needed for headache or mild pain.   lidocaine 5 % Commonly known as: Lidoderm Place 1 patch onto the skin daily. Remove & Discard patch within 12 hours or as directed by MD   metFORMIN 500 MG tablet Commonly known as: GLUCOPHAGE Take 500 mg by mouth daily with breakfast.   predniSONE 20 MG tablet Commonly known as: DELTASONE Take 2 tablets (40 mg total) by mouth daily with breakfast for 30 days, THEN 1.5 tablets (30 mg total) daily with breakfast. Start taking on: August 24, 2022   rizatriptan 5 MG tablet Commonly known as: MAXALT TAKE 1 TABLET BY MOUTH AS NEEDED FOR MIGRAINE. MAY REPEAT IN 2 HOURS IF NEEDED   topiramate 25 MG tablet Commonly known as: TOPAMAX Take 1 tablet (25 mg total) by mouth daily.   ZOLMitriptan 2.5 MG tablet Commonly known as: Zomig Take 1 tablet twice daily for 7 days beginning 2 days before first day of menses.        Disposition:    Follow-up Information     Baldwin Jamaica, PA-C Follow up.   Specialty: Cardiology Why: on 11/1 at 1005 am for post ICD check. Contact information: [address] [place] [place] [employer] [address] [phone]         Trey Sailors, [place]. Go on 09/05/2022.   Specialty: Physician Assistant Why: '@1'$ :20pm Contact information: [place] [address] [phone]                 Duration of Discharge Encounter: Greater than 30 minutes including physician time.  Jacalyn Lefevre, PA-C  08/23/2022 1:02 PM

## 2022-08-23 NOTE — TOC Transition Note (Addendum)
Transition of Care [facility]) - CM/SW Discharge Note   Patient Details  Name: Vicki Mccoy MRN: [id number] Date of Birth: Oct 04, 1981  [facility] CM/SW Contact:  Zenon Mayo, RN Phone Number: 08/23/2022, 12:30 PM   Clinical Narrative:    Patient is for possible dc today, she states her sister will be transporting her home today by car.  Meds sent to [facility] on [address].  She has no needs.           Patient Goals and CMS Choice        Discharge Placement                       Discharge Plan and Services                                     Social Determinants of Health (SDOH) Interventions     Readmission Risk Interventions     No data to display

## 2022-08-23 NOTE — Plan of Care (Signed)
  Problem: Education: Goal: Knowledge of General Education information will improve Description: Including pain rating scale, medication(s)/side effects and non-pharmacologic comfort measures Outcome: Adequate for Discharge   Problem: Health Behavior/Discharge Planning: Goal: Ability to manage health-related needs will improve Outcome: Adequate for Discharge   Problem: Clinical Measurements: Goal: Ability to maintain clinical measurements within normal limits will improve Outcome: Adequate for Discharge Goal: Will remain free from infection Outcome: Adequate for Discharge Goal: Diagnostic test results will improve Outcome: Adequate for Discharge Goal: Respiratory complications will improve Outcome: Adequate for Discharge Goal: Cardiovascular complication will be avoided Outcome: Adequate for Discharge   Problem: Activity: Goal: Risk for activity intolerance will decrease Outcome: Adequate for Discharge   Problem: Coping: Goal: Level of anxiety will decrease Outcome: Adequate for Discharge   Problem: Elimination: Goal: Will not experience complications related to bowel motility Outcome: Adequate for Discharge Goal: Will not experience complications related to urinary retention Outcome: Adequate for Discharge   Problem: Pain Managment: Goal: General experience of comfort will improve Outcome: Adequate for Discharge   Problem: Safety: Goal: Ability to remain free from injury will improve Outcome: Adequate for Discharge   Problem: Skin Integrity: Goal: Risk for impaired skin integrity will decrease Outcome: Adequate for Discharge   Problem: Education: Goal: Ability to describe self-care measures that may prevent or decrease complications (Diabetes Survival Skills Education) will improve Outcome: Adequate for Discharge Goal: Individualized Educational Video(s) Outcome: Adequate for Discharge   Problem: Coping: Goal: Ability to adjust to condition or change in health  will improve Outcome: Adequate for Discharge   Problem: Fluid Volume: Goal: Ability to maintain a balanced intake and output will improve Outcome: Adequate for Discharge   Problem: Health Behavior/Discharge Planning: Goal: Ability to identify and utilize available resources and services will improve Outcome: Adequate for Discharge Goal: Ability to manage health-related needs will improve Outcome: Adequate for Discharge   Problem: Metabolic: Goal: Ability to maintain appropriate glucose levels will improve Outcome: Adequate for Discharge   Problem: Nutritional: Goal: Maintenance of adequate nutrition will improve Outcome: Adequate for Discharge Goal: Progress toward achieving an optimal weight will improve Outcome: Adequate for Discharge   Problem: Skin Integrity: Goal: Risk for impaired skin integrity will decrease Outcome: Adequate for Discharge   Problem: Tissue Perfusion: Goal: Adequacy of tissue perfusion will improve Outcome: Adequate for Discharge   Problem: Education: Goal: Knowledge of cardiac device and self-care will improve Outcome: Adequate for Discharge Goal: Ability to safely manage health related needs after discharge will improve Outcome: Adequate for Discharge Goal: Individualized Educational Video(s) Outcome: Adequate for Discharge   Problem: Cardiac: Goal: Ability to achieve and maintain adequate cardiopulmonary perfusion will improve Outcome: Adequate for Discharge

## 2022-09-03 NOTE — Progress Notes (Unsigned)
Cardiology Office Note Date:  09/03/2022  Patient ID:  Mckenzey, Parcell [birth year], MRN [id number] PCP:  Trey Sailors, PA  Electrophysiologist: Dr. Myles Gip  ***refresh   Chief Complaint: *** wound check  History of Present Illness: Vicki Mccoy is a 41 y.o. female with history of DM, suspect sardoidosis, CHB   Admitted 08/21/22, she presented to [facility] ED with reports of syncope, and was found to have CHB on the monitor.  Pt lives at home with her daughter who has cerebral palsy and is completely dependent on her (dressing, bathing, feeding).  She had several episodes  10/16 where she suddenly felt lightheadedness and dizzy, and then lost consciousness, falling to the floor. EP consulted, c.MRI obtained given CHB in young patient. C.MRI highly suspicious for sarcoid and would need a PET though given her CHB, implant not delayed and planned for ICD. Pulmonary was brought on board, started on prednisone to continue until seen again with plans for further w/u in the office LVEF preserved Discharged 08/23/22, noting device dependent.  *** site *** acute outputs *** restrictions *** pulm   Device information Abbott dual chamber ICD implanted 08/22/22   Past Medical History:  Diagnosis Date   Allergy    Arthritis    Diabetes mellitus    Sickle cell trait ([place])     Past Surgical History:  Procedure Laterality Date   ICD IMPLANT N/A 08/22/2022   Procedure: ICD IMPLANT;  Surgeon: Melida Quitter, MD;  Location: [place] [facility];  Service: Cardiovascular;  Laterality: N/A;    Current Outpatient Medications  Medication Sig Dispense Refill   acetaminophen (TYLENOL) 325 MG tablet Take 2 tablets (650 mg total) by mouth every 4 (four) hours as needed for headache or mild pain.     lidocaine (LIDODERM) 5 % Place 1 patch onto the skin daily. Remove & Discard patch within 12 hours or as directed by MD (Patient not taking: Reported on 08/21/2022) 30 patch 0    metFORMIN (GLUCOPHAGE) 500 MG tablet Take 500 mg by mouth daily with breakfast.     predniSONE (DELTASONE) 20 MG tablet Take 2 tablets (40 mg total) by mouth daily with breakfast for 30 days, THEN 1.5 tablets (30 mg total) daily with breakfast. 105 tablet 0   rizatriptan (MAXALT) 5 MG tablet TAKE 1 TABLET BY MOUTH AS NEEDED FOR MIGRAINE. MAY REPEAT IN 2 HOURS IF NEEDED (Patient not taking: Reported on 08/21/2022) 10 tablet 0   topiramate (TOPAMAX) 25 MG tablet Take 1 tablet (25 mg total) by mouth daily. (Patient not taking: Reported on 08/21/2022) 30 tablet 0   ZOLMitriptan (ZOMIG) 2.5 MG tablet Take 1 tablet twice daily for 7 days beginning 2 days before first day of menses. (Patient not taking: Reported on 08/21/2022) 14 tablet 5   No current facility-administered medications for this visit.    Allergies:   Patient has no known allergies.   Social History:  The patient  reports that she has never smoked. She has never used smokeless tobacco. She reports that she does not drink alcohol and does not use drugs.   Family History:  The patient's family history includes Cerebral palsy in her daughter; Multiple sclerosis in her sister; Sickle cell anemia in her mother.  ROS:  Please see the history of present illness.    All other systems are reviewed and otherwise negative.   PHYSICAL EXAM:  VS:  There were no vitals taken for this visit. BMI: There is  no height or weight on file to calculate BMI. Well nourished, well developed, in no acute distress HEENT: normocephalic, atraumatic Neck: no JVD, carotid bruits or masses Cardiac:  *** RRR; no significant murmurs, no rubs, or gallops Lungs:  *** CTA b/l, no wheezing, rhonchi or rales Abd: soft, nontender MS: no deformity or *** atrophy Ext: *** no edema Skin: warm and dry, no rash Neuro:  No gross deficits appreciated Psych: euthymic mood, full affect  *** ICD site is stable, no tethering or discomfort   EKG:  not done today  Device  interrogation done today and reviewed by myself:  ***   08/22/22: c.MRI IMPRESSION: 1.  Normal LV size with EF 63%, normal wall motion.   2.  Normal RV size with EF 63%.   3. Nodular mid-wall LGE in the mid septum and the mid anterior wall (smaller).   4.  Nonspecific mild elevation in extracellular volume percentage.   5.  Borderline elevated T2 signal in the mid septum (54).   The appearance of LGE (nodular and circumscribed) is concerning for cardiac sarcoidosis.   08/22/22: TTE  1. Left ventricular ejection fraction, by estimation, is 55%. The left  ventricle has normal function. The left ventricle has no regional wall  motion abnormalities. Left ventricular diastolic parameters are  indeterminate.   2. Right ventricular systolic function is normal. The right ventricular  size is normal. There is normal pulmonary artery systolic pressure. The  estimated right ventricular systolic pressure is 66.4 mmHg.   3. Left atrial size was mildly dilated.   4. The mitral valve is normal in structure. Mild mitral valve  regurgitation. No evidence of mitral stenosis.   5. The aortic valve is tricuspid. Aortic valve regurgitation is not  visualized. No aortic stenosis is present.   6. The inferior vena cava is normal in size with greater than 50%  respiratory variability, suggesting right atrial pressure of 3 mmHg.   7. The patient was in complete heart block.   Recent Labs: 08/21/2022: BUN 14; Creatinine, Ser 0.79; Hemoglobin 13.2; Platelets 328; Potassium 4.2; Sodium 139; TSH 0.765  No results found for requested labs within last 365 days.   Estimated Creatinine Clearance: 87.3 mL/min (by C-G formula based on SCr of 0.79 mg/dL).   Wt Readings from Last 3 Encounters:  08/23/22 155 lb 6.4 oz (70.5 kg)  05/28/22 163 lb (73.9 kg)  05/03/22 163 lb 6.4 oz (74.1 kg)     Other studies reviewed: Additional studies/records reviewed today include: summarized above  ASSESSMENT AND  PLAN:  ICD ***  CHB Suspect cardiac sarcoidosis Pulmonary follow up is in place for ongoing work up and management ***  Disposition: F/u with ***  Current medicines are reviewed at length with the patient today.  The patient did not have any concerns regarding medicines.  Venetia Night, PA-C 09/03/2022 7:31 AM     [address] [place] [address] [phone] (office)  [phone] (fax)

## 2022-09-05 ENCOUNTER — Ambulatory Visit: Payer: Medicare HMO | Attending: Physician Assistant | Admitting: Physician Assistant

## 2022-09-05 ENCOUNTER — Ambulatory Visit: Payer: Medicare HMO | Admitting: Nurse Practitioner

## 2022-09-05 ENCOUNTER — Encounter: Payer: Self-pay | Admitting: Physician Assistant

## 2022-09-05 VITALS — BP 130/82 | HR 92 | Ht 64.0 in | Wt 152.0 lb

## 2022-09-05 DIAGNOSIS — D869 Sarcoidosis, unspecified: Secondary | ICD-10-CM

## 2022-09-05 DIAGNOSIS — R6889 Other general symptoms and signs: Secondary | ICD-10-CM | POA: Diagnosis not present

## 2022-09-05 DIAGNOSIS — Z95810 Presence of automatic (implantable) cardiac defibrillator: Secondary | ICD-10-CM | POA: Diagnosis not present

## 2022-09-05 DIAGNOSIS — Z5189 Encounter for other specified aftercare: Secondary | ICD-10-CM

## 2022-09-05 LAB — CUP PACEART INCLINIC DEVICE CHECK
Battery Remaining Longevity: 106 mo
Brady Statistic RA Percent Paced: 6.6 %
Brady Statistic RV Percent Paced: 14 %
Date Time Interrogation Session: 20231101125234
HighPow Impedance: 64.125
Implantable Lead Connection Status: 753985
Implantable Lead Connection Status: 753985
Implantable Lead Implant Date: 20231018
Implantable Lead Implant Date: 20231018
Implantable Lead Location: 753859
Implantable Lead Location: 753860
Implantable Pulse Generator Implant Date: 20231018
Lead Channel Impedance Value: 437.5 Ohm
Lead Channel Impedance Value: 462.5 Ohm
Lead Channel Pacing Threshold Amplitude: 0.625 V
Lead Channel Pacing Threshold Amplitude: 0.75 V
Lead Channel Pacing Threshold Pulse Width: 0.5 ms
Lead Channel Pacing Threshold Pulse Width: 0.5 ms
Lead Channel Sensing Intrinsic Amplitude: 10.2 mV
Lead Channel Sensing Intrinsic Amplitude: 2.8 mV
Lead Channel Setting Pacing Amplitude: 1 V
Lead Channel Setting Pacing Amplitude: 1.625
Lead Channel Setting Pacing Pulse Width: 0.5 ms
Lead Channel Setting Sensing Sensitivity: 0.5 mV
Pulse Gen Serial Number: 211001606
Zone Setting Status: 755011

## 2022-09-05 NOTE — Progress Notes (Deleted)
NEUROLOGY FOLLOW UP OFFICE NOTE  KEARSTYN AVITIA [id number]  Assessment/Plan:   Menstrual migraine, intractable   Migraine prevention:  Topiramate '25mg'$  at bedtime *** Migraine rescue:  Due to heart block, would discontinue rizatriptan.  Instead, will try Ubrelvy *** Limit use of pain relievers to no more than 2 days out of week to prevent risk of rebound or medication-overuse headache. Keep headache diary Follow up ***      Subjective:  Onelia Cadmus is a 41 year old female with DM 2 with polyneuropathy, arthritis and Sickle cell trait who follows up for migraines.  UPDATE: Last visit, prescribed zolmitriptan as a perimenstrual prophylaxis.  It caused dizziness and shortness of breath, so it was discontinued and she was started on topiramate '25mg'$  at bedtime as well as rizatriptan for abortive therapy.  She presented to the hospital a couple of weeks ago (10/17) after a syncopal spell and was found to have complete heart block.  Etiology was found to be due to sarcoidosis and she underwent implantation of an Abbot dual chamber ICD.  ***  Intensity:  *** Duration:  *** Frequency:  *** Frequency of abortive medication: *** Current NSAIDS/analgesics:  none Current triptans:  rizatriptan '10mg'$  Current ergotamine:  none Current anti-emetic:  none Current muscle relaxants:  none Current Antihypertensive medications:  none Current Antidepressant medications:  none Current Anticonvulsant medications:  topiramate '25mg'$  at bedtime Current anti-CGRP:  none Current Vitamins/Herbal/Supplements:  D Current Antihistamines/Decongestants:  none Other therapy:  none   Caffeine:  coffee occasionally.  Occasional soda Alcohol:  no Smoker:  no Diet:  5-6 bottles water daily.  Rarely soda.   Exercise:  walks Depression:  no; Anxiety:  no Other pain:  no Sleep hygiene:  good  HISTORY:  Onset:  Menstrual migraines as a teenager  They subsequently resolved but returned about 2 months ago.    Location:  left temple radiating into eye Quality:  throbbing Intensity:  10/10 Aura:  absent Prodrome:  absent Associated symptoms:  Photophobia, sometimes blurred vision.  She denies associated nausea, vomiting, phonophobia, osmophobia, autonomic symptoms, unilateral numbness or weakness. Duration:  3-4 days.  Aborts quickly with [facility] but pain will returns 8-9 hours later Frequency:  Occurs with her cycle.  Starts one day before menses Triggers:  Menses Relieving factors:  BC powder Activity:  aggravates     Past NSAIDS/analgesics:  naproxen, ibuprofen, Tylenol, Excedrin igraine Past abortive triptans:  sumatriptan tab, zolmitriptan (dizziness) Past abortive ergotamine:  none Past muscle relaxants:  cyclobenzaprine Past anti-emetic:  none Past antihypertensive medications:  furosemide Past antidepressant medications:  none Past anticonvulsant medications:  gabapentin Past anti-CGRP:  none Past vitamins/Herbal/Supplements:  none Past antihistamines/decongestants:  meclizine, Zyrtec, Flonase Other past therapies:  none    Family history of headache:      PAST MEDICAL HISTORY: Past Medical History:  Diagnosis Date   Allergy    Arthritis    Diabetes mellitus    Sickle cell trait (HCC)     MEDICATIONS: Current Outpatient Medications on File Prior to Visit  Medication Sig Dispense Refill   acetaminophen (TYLENOL) 325 MG tablet Take 2 tablets (650 mg total) by mouth every 4 (four) hours as needed for headache or mild pain.     lidocaine (LIDODERM) 5 % Place 1 patch onto the skin daily. Remove & Discard patch within 12 hours or as directed by MD (Patient not taking: Reported on 08/21/2022) 30 patch 0   metFORMIN (GLUCOPHAGE) 500 MG tablet Take 500 mg by  mouth daily with breakfast.     predniSONE (DELTASONE) 20 MG tablet Take 2 tablets (40 mg total) by mouth daily with breakfast for 30 days, THEN 1.5 tablets (30 mg total) daily with breakfast. 105 tablet 0   rizatriptan  (MAXALT) 5 MG tablet TAKE 1 TABLET BY MOUTH AS NEEDED FOR MIGRAINE. MAY REPEAT IN 2 HOURS IF NEEDED (Patient not taking: Reported on 08/21/2022) 10 tablet 0   topiramate (TOPAMAX) 25 MG tablet Take 1 tablet (25 mg total) by mouth daily. (Patient not taking: Reported on 08/21/2022) 30 tablet 0   ZOLMitriptan (ZOMIG) 2.5 MG tablet Take 1 tablet twice daily for 7 days beginning 2 days before first day of menses. (Patient not taking: Reported on 08/21/2022) 14 tablet 5   No current facility-administered medications on file prior to visit.    ALLERGIES: No Known Allergies  FAMILY HISTORY: Family History  Problem Relation Age of Onset   Sickle cell anemia Mother    Multiple sclerosis Sister    Cerebral palsy Daughter    Allergic rhinitis Neg Hx    Angioedema Neg Hx    Asthma Neg Hx    Eczema Neg Hx    Urticaria Neg Hx    Immunodeficiency Neg Hx       Objective:  *** General: No acute distress.  Patient appears ***-groomed.   Head:  Normocephalic/atraumatic Eyes:  Fundi examined but not visualized Neck: supple, no paraspinal tenderness, full range of motion Heart:  Regular rate and rhythm Lungs:  Clear to auscultation bilaterally Back: No paraspinal tenderness Neurological Exam: alert and oriented to person, place, and time.  Speech fluent and not dysarthric, language intact.  CN II-XII intact. Bulk and tone normal, muscle strength 5/5 throughout.  Sensation to light touch intact.  Deep tendon reflexes 2+ throughout, toes downgoing.  Finger to nose testing intact.  Gait normal, Romberg negative.   Metta Clines, DO  CC: ***

## 2022-09-05 NOTE — Patient Instructions (Signed)
Medication Instructions:    Your physician recommends that you continue on your current medications as directed. Please refer to the Current Medication list given to you today.  *If you need a refill on your cardiac medications before your next appointment, please call your pharmacy*   Lab Work: [place]   If you have labs (blood work) drawn today and your tests are completely normal, you will receive your results only by: [place] (if you have MyChart) OR A paper copy in the mail If you have any lab test that is abnormal or we need to change your treatment, we will call you to review the results.   Testing/Procedures: NONE ORDERED  TODAY     Follow-Up: At [facility], you and your health needs are our priority.  As part of our continuing mission to provide you with exceptional heart care, we have created designated Provider Care Teams.  These Care Teams include your primary Cardiologist (physician) and Advanced Practice Providers (APPs -  Physician Assistants and Nurse Practitioners) who all work together to provide you with the care you need, when you need it.  We recommend signing up for the patient portal called "MyChart".  Sign up information is provided on this After Visit Summary.  MyChart is used to connect with patients for Virtual Visits (Telemedicine).  Patients are able to view lab/test results, encounter notes, upcoming appointments, etc.  Non-urgent messages can be sent to your provider as well.   To learn more about what you can do with MyChart, go to [email or web address].    Your next appointment:   2 month(s)  The format for your next appointment:   In Person  Provider:   Doralee Albino, MD    Other Instructions  Please contact  [facility] in [place], [place] Get online care: [email or web address] Located in: [place] [facility] Address: [address],  [place], [place] [address] Hours:  Open ? Closes 5?PM Phone: [phone]  Important Information About Sugar

## 2022-09-07 ENCOUNTER — Ambulatory Visit: Payer: Medicare HMO | Admitting: Neurology

## 2022-09-10 ENCOUNTER — Encounter: Payer: Self-pay | Admitting: Neurology

## 2022-09-10 ENCOUNTER — Ambulatory Visit (INDEPENDENT_AMBULATORY_CARE_PROVIDER_SITE_OTHER): Payer: Medicare HMO | Admitting: Neurology

## 2022-09-10 VITALS — BP 149/89 | HR 92 | Ht 64.0 in | Wt 152.0 lb

## 2022-09-10 DIAGNOSIS — G43829 Menstrual migraine, not intractable, without status migrainosus: Secondary | ICD-10-CM | POA: Diagnosis not present

## 2022-09-10 NOTE — Patient Instructions (Addendum)
Continue topiramate '25mg'$  at bedtime Stop rizatriptan.  If you get a migraine, take Ubrelvy.  May repeat after 2 hours.  Let me know if effective Follow up 5 months.

## 2022-09-10 NOTE — Progress Notes (Signed)
Medication Samples have been provided to the patient.  Drug name: Tiffany Kocher       Strength: 100 mg        Qty: 4  LOT: [id number]  Exp.Date: 07/2024  Dosing instructions: as needed  The patient has been instructed regarding the correct time, dose, and frequency of taking this medication, including desired effects and most common side effects.   Venetia Night 10:58 AM 09/10/2022

## 2022-09-10 NOTE — Progress Notes (Signed)
NEUROLOGY FOLLOW UP OFFICE NOTE  Vicki Mccoy [id number]  Assessment/Plan:   Menstrual migraine, stable   Migraine prevention:  Topiramate '25mg'$  at bedtime  Due to recent heart block, would like to avoid triptans.  Will have her try samples of Ubrelvy.  Stop rizatriptan Limit use of pain relievers to no more than 2 days out of week to prevent risk of rebound or medication-overuse headache. Keep headache diary Follow up 5 months      Subjective:  Vicki Mccoy is a 41 year old female with DM 2 with polyneuropathy, arthritis and Sickle cell trait who follows up for migraines.  UPDATE: Last visit, prescribed zolmitriptan as a perimenstrual prophylaxis.  It caused dizziness and shortness of breath, so it was discontinued and she was started on topiramate '25mg'$  at bedtime as well as rizatriptan for abortive therapy.  Her last migraine was a couple of months ago.  She hasn't yet tried rizatriptan.    She presented to the hospital a couple of weeks ago (10/17) after a syncopal spell and was found to have complete heart block.  Etiology was found to be due to sarcoidosis and she underwent implantation of an Abbot dual chamber ICD.  She has since been doing better.    Current NSAIDS/analgesics:  none Current triptans:  rizatriptan '10mg'$  Current ergotamine:  none Current anti-emetic:  none Current muscle relaxants:  none Current Antihypertensive medications:  none Current Antidepressant medications:  none Current Anticonvulsant medications:  topiramate '25mg'$  at bedtime Current anti-CGRP:  none Current Vitamins/Herbal/Supplements:  D Current Antihistamines/Decongestants:  none Other therapy:  none   Caffeine:  coffee occasionally.  Occasional soda Alcohol:  no Smoker:  no Diet:  5-6 bottles water daily.  Rarely soda.   Exercise:  walks Depression:  no; Anxiety:  no Other pain:  no Sleep hygiene:  good  HISTORY:  Onset:  Menstrual migraines as a teenager  They subsequently  resolved but returned about 2 months ago.   Location:  left temple radiating into eye Quality:  throbbing Intensity:  10/10 Aura:  absent Prodrome:  absent Associated symptoms:  Photophobia, sometimes blurred vision.  She denies associated nausea, vomiting, phonophobia, osmophobia, autonomic symptoms, unilateral numbness or weakness. Duration:  3-4 days.  Aborts quickly with [facility] but pain will returns 8-9 hours later Frequency:  Occurs with her cycle.  Starts one day before menses Triggers:  Menses Relieving factors:  BC powder Activity:  aggravates     Past NSAIDS/analgesics:  naproxen, ibuprofen, Tylenol, Excedrin igraine Past abortive triptans:  sumatriptan tab, zolmitriptan (dizziness) Past abortive ergotamine:  none Past muscle relaxants:  cyclobenzaprine Past anti-emetic:  none Past antihypertensive medications:  furosemide Past antidepressant medications:  none Past anticonvulsant medications:  gabapentin Past anti-CGRP:  none Past vitamins/Herbal/Supplements:  none Past antihistamines/decongestants:  meclizine, Zyrtec, Flonase Other past therapies:  none    Family history of headache:      PAST MEDICAL HISTORY: Past Medical History:  Diagnosis Date   Allergy    Arthritis    Diabetes mellitus    Sickle cell trait (HCC)     MEDICATIONS: Current Outpatient Medications on File Prior to Visit  Medication Sig Dispense Refill   acetaminophen (TYLENOL) 325 MG tablet Take 2 tablets (650 mg total) by mouth every 4 (four) hours as needed for headache or mild pain.     lidocaine (LIDODERM) 5 % Place 1 patch onto the skin daily. Remove & Discard patch within 12 hours or as directed by MD 30 patch 0  metFORMIN (GLUCOPHAGE) 500 MG tablet Take 250 mg by mouth daily with breakfast.     predniSONE (DELTASONE) 20 MG tablet Take 2 tablets (40 mg total) by mouth daily with breakfast for 30 days, THEN 1.5 tablets (30 mg total) daily with breakfast. 105 tablet 0   rizatriptan (MAXALT)  5 MG tablet TAKE 1 TABLET BY MOUTH AS NEEDED FOR MIGRAINE. MAY REPEAT IN 2 HOURS IF NEEDED 10 tablet 0   topiramate (TOPAMAX) 25 MG tablet Take 1 tablet (25 mg total) by mouth daily. 30 tablet 0   No current facility-administered medications on file prior to visit.    ALLERGIES: No Known Allergies  FAMILY HISTORY: Family History  Problem Relation Age of Onset   Sickle cell anemia Mother    Multiple sclerosis Sister    Cerebral palsy Daughter    Allergic rhinitis Neg Hx    Angioedema Neg Hx    Asthma Neg Hx    Eczema Neg Hx    Urticaria Neg Hx    Immunodeficiency Neg Hx       Objective:  Blood pressure (!) 149/89, pulse 92, height '5\' 4"'$  (1.626 m), weight 152 lb (68.9 kg), SpO2 100 %. General: No acute distress.  Patient appears well-groomed.   Head:  Normocephalic/atraumatic Eyes:  Fundi examined but not visualized Neck: supple, no paraspinal tenderness, full range of motion Heart:  Regular rate and rhythm Lungs:  Clear to auscultation bilaterally Back: No paraspinal tenderness Neurological Exam: alert and oriented to person, place, and time.  Speech fluent and not dysarthric, language intact.  CN II-XII intact. Bulk and tone normal, muscle strength 5/5 throughout.  Sensation to light touch intact.  Deep tendon reflexes 2+ throughout, toes downgoing.  Finger to nose testing intact.  Gait normal, Romberg negative.   Metta Clines, DO  CC: Raelyn Number, PA

## 2022-09-11 DIAGNOSIS — E6609 Other obesity due to excess calories: Secondary | ICD-10-CM | POA: Diagnosis not present

## 2022-09-11 DIAGNOSIS — Z6830 Body mass index (BMI) 30.0-30.9, adult: Secondary | ICD-10-CM | POA: Diagnosis not present

## 2022-09-11 DIAGNOSIS — G43019 Migraine without aura, intractable, without status migrainosus: Secondary | ICD-10-CM | POA: Diagnosis not present

## 2022-09-11 DIAGNOSIS — E782 Mixed hyperlipidemia: Secondary | ICD-10-CM | POA: Diagnosis not present

## 2022-09-11 DIAGNOSIS — E1142 Type 2 diabetes mellitus with diabetic polyneuropathy: Secondary | ICD-10-CM | POA: Diagnosis not present

## 2022-09-11 DIAGNOSIS — J301 Allergic rhinitis due to pollen: Secondary | ICD-10-CM | POA: Diagnosis not present

## 2022-09-11 DIAGNOSIS — E1165 Type 2 diabetes mellitus with hyperglycemia: Secondary | ICD-10-CM | POA: Diagnosis not present

## 2022-09-19 ENCOUNTER — Institutional Professional Consult (permissible substitution) (HOSPITAL_BASED_OUTPATIENT_CLINIC_OR_DEPARTMENT_OTHER): Payer: Medicare HMO | Admitting: Pulmonary Disease

## 2022-10-02 ENCOUNTER — Other Ambulatory Visit (HOSPITAL_BASED_OUTPATIENT_CLINIC_OR_DEPARTMENT_OTHER): Payer: Self-pay

## 2022-10-02 ENCOUNTER — Ambulatory Visit (INDEPENDENT_AMBULATORY_CARE_PROVIDER_SITE_OTHER): Payer: Medicare HMO | Admitting: Pulmonary Disease

## 2022-10-02 ENCOUNTER — Encounter (HOSPITAL_BASED_OUTPATIENT_CLINIC_OR_DEPARTMENT_OTHER): Payer: Self-pay | Admitting: Pulmonary Disease

## 2022-10-02 VITALS — BP 122/80 | HR 77 | Ht 64.0 in | Wt 140.0 lb

## 2022-10-02 DIAGNOSIS — D8685 Sarcoid myocarditis: Secondary | ICD-10-CM | POA: Diagnosis not present

## 2022-10-02 DIAGNOSIS — R6889 Other general symptoms and signs: Secondary | ICD-10-CM | POA: Diagnosis not present

## 2022-10-02 MED ORDER — PREDNISONE 20 MG PO TABS: 20.0000 mg | ORAL_TABLET | Freq: Every day | ORAL | 0 refills | Status: DC

## 2022-10-02 NOTE — Progress Notes (Signed)
Subjective:   PATIENT ID: Vicki Mccoy GENDER: female DOB: 05/11/1981, MRN: [id number]  Chief Complaint  Patient presents with   Hospitalization Follow-up    Pt was seen in hospital, states no breathing issues     Reason for Visit: New consult for sarcoidosis  [name] is a 41 year old female with CHB s/p ICD 08/2022, DM2, GERD, migraines who presents as a new consult for sarcoid.  She was admitted in October 2023 for syncope and diagnosed with CHB s/p ICD placement. Cardiac MRI demonstrated concerns for sarcoidosis  She was started prednisone taper 40 mg x 1 month followed by 30 mg daily. Tolerating well. No reports of fever, fatigue, weight loss, visual disturbance, dyspnea, cough, palpitations, abdominal pain, numbness, tingling, lightheadedness, syncope   Social History: Never smoker  I have personally reviewed patient's past medical/family/social history, allergies, current medications.  Past Medical History:  Diagnosis Date   Allergy    Arthritis    Diabetes mellitus    Sickle cell trait (HCC)      Family History  Problem Relation Age of Onset   Sickle cell anemia Mother    Multiple sclerosis Sister    Cerebral palsy Daughter    Allergic rhinitis Neg Hx    Angioedema Neg Hx    Asthma Neg Hx    Eczema Neg Hx    Urticaria Neg Hx    Immunodeficiency Neg Hx      Social History   Occupational History   Occupation: Administrator, Civil Service: UNEMPLOYED    Comment: housekeeping  Tobacco Use   Smoking status: Never   Smokeless tobacco: Never  Substance and Sexual Activity   Alcohol use: No    Alcohol/week: 0.0 standard drinks of alcohol   Drug use: No   Sexual activity: Not Currently    Partners: Male    Birth control/protection: Pill    No Known Allergies   Outpatient Medications Prior to Visit  Medication Sig Dispense Refill   acetaminophen (TYLENOL) 325 MG tablet Take 2 tablets (650 mg total) by mouth every 4 (four) hours as needed for  headache or mild pain.     lidocaine (LIDODERM) 5 % Place 1 patch onto the skin daily. Remove & Discard patch within 12 hours or as directed by MD 30 patch 0   metFORMIN (GLUCOPHAGE) 500 MG tablet Take 250 mg by mouth daily with breakfast.     predniSONE (DELTASONE) 20 MG tablet Take 2 tablets (40 mg total) by mouth daily with breakfast for 30 days, THEN 1.5 tablets (30 mg total) daily with breakfast. 105 tablet 0   rizatriptan (MAXALT) 5 MG tablet TAKE 1 TABLET BY MOUTH AS NEEDED FOR MIGRAINE. MAY REPEAT IN 2 HOURS IF NEEDED 10 tablet 0   topiramate (TOPAMAX) 25 MG tablet Take 1 tablet (25 mg total) by mouth daily. (Patient not taking: Reported on 10/02/2022) 30 tablet 0   No facility-administered medications prior to visit.    Review of Systems  Constitutional:  Negative for chills, diaphoresis, fever, malaise/fatigue and weight loss.  HENT:  Negative for congestion.   Respiratory:  Negative for cough, hemoptysis, sputum production, shortness of breath and wheezing.   Cardiovascular:  Negative for chest pain (sore at ICD site), palpitations and leg swelling.     Objective:   Vitals:   10/02/22 1104  BP: 122/80  Pulse: 77  SpO2: 100%  Weight: 140 lb (63.5 kg)  Height: _0  (1.626 m)   SpO2:  100 % O2 Device: None (Room air)  Physical Exam: General: Well-appearing, no acute distress HENT: [place], AT Eyes: EOMI, no scleral icterus Respiratory: Clear to auscultation bilaterally.  No crackles, wheezing or rales Cardiovascular: RRR, -M/R/G, no JVD Extremities:-Edema,-tenderness Neuro: AAO x4, CNII-XII grossly intact Psych: Normal mood, normal affect  Data Reviewed:  Imaging: MR Cardiac 08/22/22 - Concerning for cardiac sarcoid CT Chest 08/23/22 - No mediastinal or hilar adenopathy. Scattered subcentimeter pulmonary nodules with largest 6 mm in RUL  PFT: 09/20/15 FVC 2.52 (107%) FEV1 2.18 (99%) Ratio 86   Interpretation: Normal spirometry  Labs:    Latest Ref Rng & Units  08/21/2022    1:20 PM 05/28/2022    2:42 PM 01/10/2018    4:30 AM  CBC  WBC 4.0 - 10.5 K/uL 4.5  5.5  6.6   Hemoglobin 12.0 - 15.0 g/dL 13.2  12.6  12.3   Hematocrit 36.0 - 46.0 % 39.7  37.0  38.0   Platelets 150 - 400 K/uL 328  280  280       Latest Ref Rng & Units 08/21/2022    1:20 PM 05/28/2022    2:42 PM 01/10/2018    4:30 AM  BMP  Glucose 70 - 99 mg/dL 92  98  147   BUN 6 - 20 mg/dL _0 Creatinine 0.44 - 1.00 mg/dL 0.79  0.95  0.97   Sodium 135 - 145 mmol/L 139  137  141   Potassium 3.5 - 5.1 mmol/L 4.2  3.7  4.2   Chloride 98 - 111 mmol/L 106  107  107   CO2 22 - 32 mmol/L _1 Calcium 8.9 - 10.3 mg/dL 9.7  9.2  8.9       Latest Ref Rng & Units 01/10/2018    4:30 AM  Hepatic Function  Total Protein 6.5 - 8.1 g/dL 7.0   Albumin 3.5 - 5.0 g/dL 3.6   AST 15 - 41 U/L 18   ALT 14 - 54 U/L 12   Alk Phosphatase 38 - 126 U/L 48   Total Bilirubin 0.3 - 1.2 mg/dL 0.6         Assessment & Plan:   Discussion: 41 year old female with CHB s/p ICD 08/2022, DM2, GERD, migraines who presents as a new consult for sarcoid.  We discussed the clinical course of sarcoid and management including serial PFTs, labs, eye exam, and EKG and chest imaging if indicated. If symptoms suggest sarcoid flare in the future, we would manage with steroids +/- biologics.  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 Insufficient tobacco history  Orders Placed This Encounter  Procedures   Hepatic function panel    Standing Status:   Future    Number of Occurrences:   1  Standing Expiration Date:   10/02/2023   Hepatitis B Core Antibody, IgM    Standing Status:   Future    Number of Occurrences:   1    Standing Expiration Date:   10/02/2023   Hepatitis B Surface AntiGEN    Standing Status:   Future    Number of Occurrences:   1    Standing Expiration Date:   10/02/2023   Hepatitis C Antibody    Standing Status:   Future    Number of Occurrences:   1    Standing Expiration Date:   10/02/2023   Meds ordered this encounter  Medications   predniSONE (DELTASONE) 20 MG tablet    Sig: Take 1 tablet (20 mg total) by mouth daily with breakfast.    Dispense:  30 tablet    Refill:  0    Return in about 2 months (around 12/02/2022).  I have spent a total time of 40-minutes on the day of the appointment reviewing prior documentation, coordinating care and discussing medical diagnosis and plan with the patient/family. Imaging, labs and tests included in this note have been reviewed and interpreted independently by me.  [place], MD [facility] 10/02/2022 11:45 AM  Office Number [phone]

## 2022-10-02 NOTE — Patient Instructions (Addendum)
  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

## 2022-10-03 DIAGNOSIS — H52209 Unspecified astigmatism, unspecified eye: Secondary | ICD-10-CM | POA: Diagnosis not present

## 2022-10-03 DIAGNOSIS — H5213 Myopia, bilateral: Secondary | ICD-10-CM | POA: Diagnosis not present

## 2022-10-03 LAB — HEPATIC FUNCTION PANEL
ALT: 13 IU/L (ref 0–32)
AST: 16 IU/L (ref 0–40)
Albumin: 4.4 g/dL (ref 3.9–4.9)
Alkaline Phosphatase: 50 IU/L (ref 44–121)
Bilirubin Total: 1.5 mg/dL — ABNORMAL HIGH (ref 0.0–1.2)
Bilirubin, Direct: 0.38 mg/dL (ref 0.00–0.40)
Total Protein: 6.8 g/dL (ref 6.0–8.5)

## 2022-10-03 LAB — HEPATITIS C ANTIBODY: Hep C Virus Ab: NONREACTIVE

## 2022-10-03 LAB — HEPATITIS B CORE ANTIBODY, IGM: Hep B C IgM: NEGATIVE

## 2022-10-03 LAB — HEPATITIS B SURFACE ANTIGEN: Hepatitis B Surface Ag: NEGATIVE

## 2022-10-09 ENCOUNTER — Encounter: Payer: Self-pay | Admitting: Neurology

## 2022-10-21 ENCOUNTER — Encounter (HOSPITAL_BASED_OUTPATIENT_CLINIC_OR_DEPARTMENT_OTHER): Payer: Self-pay | Admitting: Pulmonary Disease

## 2022-11-06 ENCOUNTER — Other Ambulatory Visit: Payer: Self-pay | Admitting: Student

## 2022-11-16 DIAGNOSIS — M79601 Pain in right arm: Secondary | ICD-10-CM | POA: Diagnosis not present

## 2022-11-23 ENCOUNTER — Ambulatory Visit: Payer: Medicare HMO | Attending: Cardiovascular Disease

## 2022-11-23 DIAGNOSIS — H43821 Vitreomacular adhesion, right eye: Secondary | ICD-10-CM | POA: Diagnosis not present

## 2022-11-23 DIAGNOSIS — I442 Atrioventricular block, complete: Secondary | ICD-10-CM

## 2022-11-23 DIAGNOSIS — H3553 Other dystrophies primarily involving the sensory retina: Secondary | ICD-10-CM | POA: Diagnosis not present

## 2022-11-23 DIAGNOSIS — E119 Type 2 diabetes mellitus without complications: Secondary | ICD-10-CM | POA: Diagnosis not present

## 2022-11-23 LAB — CUP PACEART REMOTE DEVICE CHECK
Battery Remaining Longevity: 103 mo
Battery Remaining Percentage: 94 %
Battery Voltage: 3.02 V
Brady Statistic AP VP Percent: 1 %
Brady Statistic AP VS Percent: 12 %
Brady Statistic AS VP Percent: 7.8 %
Brady Statistic AS VS Percent: 80 %
Brady Statistic RA Percent Paced: 11 %
Brady Statistic RV Percent Paced: 8 %
Date Time Interrogation Session: 20240118200717
HighPow Impedance: 60 Ohm
Implantable Lead Connection Status: 753985
Implantable Lead Connection Status: 753985
Implantable Lead Implant Date: 20231018
Implantable Lead Implant Date: 20231018
Implantable Lead Location: 753859
Implantable Lead Location: 753860
Implantable Pulse Generator Implant Date: 20231018
Lead Channel Impedance Value: 360 Ohm
Lead Channel Impedance Value: 400 Ohm
Lead Channel Pacing Threshold Amplitude: 0.5 V
Lead Channel Pacing Threshold Amplitude: 0.875 V
Lead Channel Pacing Threshold Pulse Width: 0.5 ms
Lead Channel Pacing Threshold Pulse Width: 0.5 ms
Lead Channel Sensing Intrinsic Amplitude: 2.2 mV
Lead Channel Sensing Intrinsic Amplitude: 5.1 mV
Lead Channel Setting Pacing Amplitude: 1.125
Lead Channel Setting Pacing Amplitude: 1.5 V
Lead Channel Setting Pacing Pulse Width: 0.5 ms
Lead Channel Setting Sensing Sensitivity: 0.5 mV
Pulse Gen Serial Number: 211001606
Zone Setting Status: 755011

## 2022-11-26 ENCOUNTER — Ambulatory Visit (INDEPENDENT_AMBULATORY_CARE_PROVIDER_SITE_OTHER): Payer: Medicare HMO | Admitting: Pulmonary Disease

## 2022-11-26 ENCOUNTER — Encounter (HOSPITAL_BASED_OUTPATIENT_CLINIC_OR_DEPARTMENT_OTHER): Payer: Self-pay | Admitting: Pulmonary Disease

## 2022-11-26 VITALS — BP 108/64 | HR 93 | Temp 98.7°F | Ht 64.0 in | Wt 133.4 lb

## 2022-11-26 DIAGNOSIS — D8685 Sarcoid myocarditis: Secondary | ICD-10-CM | POA: Diagnosis not present

## 2022-11-26 NOTE — Progress Notes (Signed)
Subjective:   PATIENT ID: Vicki Mccoy GENDER: female DOB: 04-23-1981, MRN: [id number]  Chief Complaint  Patient presents with   Follow-up    Pt states she is doing some better after the pred taper. States that she is having pain in her shoulders. Denies any complaints of wheezing or SOB.    Reason for Visit: Follow-up  [name] is a 42 year old female with CHB s/p ICD 08/2022, DM2, GERD, migraines who presents for sarcoid follow-up  Synopsis: She was admitted in October 2023 for syncope and diagnosed with CHB s/p ICD placement. Cardiac MRI demonstrated concerns for sarcoidosis She was started prednisone taper 40 mg x 1 month followed by 30 mg daily. Tolerating well. No reports of fever, fatigue, weight loss, visual disturbance, dyspnea, cough, palpitations, abdominal pain, numbness, tingling, lightheadedness, syncope   11/26/22 She is currently on prednisone 20 mg daily. Tolerating well. Denies shortness of breath, wheezing or cough. Denies chest pain. Denies limitation in activity. Daughter has CP and she is the main caregiver. Has some occasional shoulder pain. Denies fatigue, weight loss, night sweats  Social History: Never smoker Daughter with cerebral palsy  Past Medical History:  Diagnosis Date   Allergy    Arthritis    Diabetes mellitus    Sickle cell trait (HCC)      Family History  Problem Relation Age of Onset   Sickle cell anemia Mother    Multiple sclerosis Sister    Cerebral palsy Daughter    Allergic rhinitis Neg Hx    Angioedema Neg Hx    Asthma Neg Hx    Eczema Neg Hx    Urticaria Neg Hx    Immunodeficiency Neg Hx      Social History   Occupational History   Occupation: Administrator, Civil Service: UNEMPLOYED    Comment: housekeeping  Tobacco Use   Smoking status: Never   Smokeless tobacco: Never  Substance and Sexual Activity   Alcohol use: No    Alcohol/week: 0.0 standard drinks of alcohol   Drug use: No   Sexual activity: Not  Currently    Partners: Male    Birth control/protection: Pill    No Known Allergies   Outpatient Medications Prior to Visit  Medication Sig Dispense Refill   acetaminophen (TYLENOL) 325 MG tablet Take 2 tablets (650 mg total) by mouth every 4 (four) hours as needed for headache or mild pain.     lidocaine (LIDODERM) 5 % Place 1 patch onto the skin daily. Remove & Discard patch within 12 hours or as directed by MD 30 patch 0   metFORMIN (GLUCOPHAGE) 500 MG tablet Take 250 mg by mouth daily with breakfast.     predniSONE (DELTASONE) 20 MG tablet Take 1 tablet (20 mg total) by mouth daily with breakfast. 30 tablet 0   rizatriptan (MAXALT) 5 MG tablet TAKE 1 TABLET BY MOUTH AS NEEDED FOR MIGRAINE. MAY REPEAT IN 2 HOURS IF NEEDED 10 tablet 0   topiramate (TOPAMAX) 25 MG tablet Take 1 tablet (25 mg total) by mouth daily. (Patient not taking: Reported on 10/02/2022) 30 tablet 0   No facility-administered medications prior to visit.    Review of Systems  Constitutional:  Negative for chills, diaphoresis, fever, malaise/fatigue and weight loss.  HENT:  Negative for congestion.   Respiratory:  Negative for cough, hemoptysis, sputum production, shortness of breath and wheezing.   Cardiovascular:  Negative for chest pain, palpitations and leg swelling.  Objective:   Vitals:   11/26/22 1133  BP: 108/64  Pulse: 93  Temp: 98.7 F (37.1 C)  TempSrc: Oral  SpO2: 99%  Weight: 60.5 kg  Height: '5\' 4"'$  (1.626 m)    Physical Exam: General: Well-appearing, no acute distress HENT: [place], AT Eyes: EOMI, no scleral icterus Respiratory: Clear to auscultation bilaterally.  No crackles, wheezing or rales Cardiovascular: RRR, -M/R/G, no JVD Extremities:-Edema,-tenderness Neuro: AAO x4, CNII-XII grossly intact Psych: Normal mood, normal affect  Data Reviewed:  Imaging: MR Cardiac 08/22/22 - Concerning for cardiac sarcoid CT Chest 08/23/22 - No mediastinal or hilar adenopathy. Scattered  subcentimeter pulmonary nodules with largest 6 mm in RUL  PFT: 09/20/15 FVC 2.52 (107%) FEV1 2.18 (99%) Ratio 86   Interpretation: Normal spirometry  Labs:    Latest Ref Rng & Units 08/21/2022    1:20 PM 05/28/2022    2:42 PM 01/10/2018    4:30 AM  CBC  WBC 4.0 - 10.5 K/uL 4.5  5.5  6.6   Hemoglobin 12.0 - 15.0 g/dL 13.2  12.6  12.3   Hematocrit 36.0 - 46.0 % 39.7  37.0  38.0   Platelets 150 - 400 K/uL 328  280  280       Latest Ref Rng & Units 08/21/2022    1:20 PM 05/28/2022    2:42 PM 01/10/2018    4:30 AM  BMP  Glucose 70 - 99 mg/dL 92  98  147   BUN 6 - 20 mg/dL '14  12  10   '$ Creatinine 0.44 - 1.00 mg/dL 0.79  0.95  0.97   Sodium 135 - 145 mmol/L 139  137  141   Potassium 3.5 - 5.1 mmol/L 4.2  3.7  4.2   Chloride 98 - 111 mmol/L 106  107  107   CO2 22 - 32 mmol/L '23  23  22   '$ Calcium 8.9 - 10.3 mg/dL 9.7  9.2  8.9       Latest Ref Rng & Units 10/02/2022    1:41 PM 01/10/2018    4:30 AM  Hepatic Function  Total Protein 6.0 - 8.5 g/dL 6.8  7.0   Albumin 3.9 - 4.9 g/dL 4.4  3.6   AST 0 - 40 IU/L 16  18   ALT 0 - 32 IU/L 13  12   Alk Phosphatase 44 - 121 IU/L 50  48   Total Bilirubin 0.0 - 1.2 mg/dL 1.5  0.6   Bilirubin, Direct 0.00 - 0.40 mg/dL 0.38          Assessment & Plan:   Discussion: 42 year old female with CHB s/p ICD 08/2022, DM2, GERD, migraines who presents as a new consult for sarcoid.  We discussed the clinical course of sarcoid and management including serial PFTs, labs, eye exam, and EKG and chest imaging if indicated. If symptoms suggest sarcoid flare in the future, we would manage with steroids +/- biologics. Discussed management with methotrexate to titrate off steroids.  Suspected cardiac sarcoidosis Complete heart block s/p ICD 08/2022 --Cardiac MRI 08/22/22 concerning for sarcoidosis --Continue weaning prednisone  11/26/21 - Start 10 mg (1/2 tab) daily --START methotrexate with goal 20 mg weekly. Will need to titrate off prednisone --Will  plan for [facility] Sarcoid PET/CT six months after initiation of methotrexate. Will order at next visit  History of immunosuppression High risk medication management --Prednisone 08/23/22-current. Initially 40 mg --Prednisone 11/26/21 10 mg daily --Starting methotrexate 12/2022. --QuantiFERON-TB - not collected --Recommend PPI daily when  taking steroids  Sarcoid Monitoring --Recent chest imaging reviewed.  --Annual PFTs.  Last PFTs 09/2015 --Annual ophthalmology exam.  Last visit on 2023 --Recent EKG and TTE reviewed. 08/22/22 Normal except for CHB --Routine labs as needed: CBC with diff, CMET, 1, 25 and 25 hydroxy vitamin D, urinary calcium  Subcentimeter pulmonary nodules with largest 6 mm in RUL --ORDER CT Chest without contrast for March  Insomnia due to steroids --Buy over the counter melatonin 2 mg nightly for sleep as needed  Health Maintenance Immunization History  Administered Date(s) Administered   Influenza Whole 10/06/2008, 11/01/2009, 09/20/2010   Td 06/20/2010   CT Lung Screen - not qualified. Insufficient tobacco history  Orders Placed This Encounter  Procedures   CT Chest Wo Contrast    Standing Status:   Future    Standing Expiration Date:   12/03/2023    Scheduling Instructions:     Schedule for March before appointment (01/22/23)    Order Specific Question:   Is patient pregnant?    Answer:   Unknown (Please Explain)    Comments:   young female    Order Specific Question:   Preferred imaging location?    Answer:   [facility]   No orders of the defined types were placed in this encounter.  Return in about 2 months (around 01/25/2023).  I have spent a total time of 31-minutes on the day of the appointment including chart review, data review, collecting history, coordinating care and discussing medical diagnosis and plan with the patient/family. Past medical history, allergies, medications were reviewed. Pertinent imaging, labs and tests included in  this note have been reviewed and interpreted independently by me.  Gardner, MD [facility] 11/26/2022 11:31 AM  Office Number [phone]

## 2022-11-26 NOTE — Patient Instructions (Addendum)
Suspected cardiac sarcoidosis --Continue prednisone  11/26/21 - Start 10 mg (1/2 tab) daily --START methotrexate with goal 20 mg weekly. Will need to titrate off prednisone  >Pharmacy team message has been sent --Will request records from Dr. Baird Cancer at [facility] [id number])  Subcentimeter pulmonary nodules with largest 6 mm in RUL --ORDER CT Chest without contrast  Follow-up with me in two months

## 2022-11-27 ENCOUNTER — Encounter: Payer: Medicare HMO | Admitting: Cardiovascular Disease

## 2022-12-02 ENCOUNTER — Encounter (HOSPITAL_BASED_OUTPATIENT_CLINIC_OR_DEPARTMENT_OTHER): Payer: Self-pay | Admitting: Pulmonary Disease

## 2022-12-10 NOTE — Progress Notes (Unsigned)
    Electrophysiology Office Note Date: 12/12/2022  ID:  Vicki Mccoy, DOB May 24, 1981, MRN [id number]  PCP: Trey Sailors, PA Primary Cardiologist: None Electrophysiologist: Melida Quitter, MD   CC: Routine ICD follow-up  Vicki Mccoy is a 42 y.o. female seen today for Melida Quitter, MD for routine electrophysiology followup. Since last being seen in our clinic the patient reports doing well from a cardiac perspective. Has occasional R shoulder pain, musculoskeletal.  Has rare palpitations.  she denies chest pain,  dyspnea, PND, orthopnea, nausea, vomiting, dizziness, syncope, edema, weight gain, or early satiety.   She has not had ICD shocks.   Device History: Abbott dual chamber ICD implanted 08/22/22   Past Medical History:  Diagnosis Date   Allergy    Arthritis    Diabetes mellitus    Sickle cell trait (HCC)     Current Outpatient Medications  Medication Instructions   ACCU-CHEK GUIDE test strip USE TO CHECK BLOOD SUGAR 3 TIMES DAILY   acetaminophen (TYLENOL) 650 mg, Oral, Every 4 hours PRN   metFORMIN (GLUCOPHAGE) 250 mg, Oral, Daily with breakfast   predniSONE (DELTASONE) 20 mg, Oral, Daily with breakfast   sulindac (CLINORIL) 150 mg, Oral, 2 times daily, Per patient taking daily as needed    Family History: Family History  Problem Relation Age of Onset   Sickle cell anemia Mother    Multiple sclerosis Sister    Cerebral palsy Daughter    Allergic rhinitis Neg Hx    Angioedema Neg Hx    Asthma Neg Hx    Eczema Neg Hx    Urticaria Neg Hx    Immunodeficiency Neg Hx     Physical Exam: Vitals:   12/12/22 1010  BP: 104/64  Pulse: 99  SpO2: 97%  Weight: 135 lb (61.2 kg)  Height: '5\' 4"'$  (1.626 m)     GEN- NAD. A&O x 3. Normal affect. HEENT: Normocephalic, atraumatic Lungs- CTAB, Normal effort.  Heart- Regular rate and rhythm rate and rhythm. No M/G/R.  Extremities- No peripheral edema. no clubbing or cyanosis Skin- warm and dry, no rash or  lesion; ICD pocket well healed  ICD interrogation- reviewed in detail today,  See PACEART report  Other studies Reviewed: Additional studies/ records that were reviewed today include: Previous EP office notes.   cMRI 1.  Normal LV size with EF 63%, normal wall motion. 2.  Normal RV size with EF 63%. 3. Nodular mid-wall LGE in the mid septum and the mid anterior wall (smaller). 4.  Nonspecific mild elevation in extracellular volume percentage. 5.  Borderline elevated T2 signal in the mid septum (54).   The appearance of LGE (nodular and circumscribed) is concerning for cardiac sarcoidosis.  Assessment and Plan:  1.CHB s/p Abbott dual chamber ICD  euvolemic today Stable on an appropriate medical regimen Normal ICD function See Pace Art report No changes today  2. Suspected Sarcoidosis Following with Pulmonary as well.  Recommending annual PFTs and ophthalmology.   3. SVT Multiple, short episodes noted.  Offered low dose BB and she declines at this time.   Current medicines are reviewed at length with the patient today.     Disposition:   Follow up with Dr. Myles Gip in 12 months   Signed, Shirley Friar, PA-C  12/12/2022 10:21 AM  [place] [address] [place] [address] [phone] (office) [phone] (fax)

## 2022-12-10 NOTE — Progress Notes (Signed)
Remote ICD transmission.   

## 2022-12-11 ENCOUNTER — Telehealth: Payer: Self-pay | Admitting: Pharmacist

## 2022-12-11 ENCOUNTER — Telehealth: Payer: Self-pay | Admitting: Cardiovascular Disease

## 2022-12-11 DIAGNOSIS — Z79899 Other long term (current) drug therapy: Secondary | ICD-10-CM

## 2022-12-11 DIAGNOSIS — D8685 Sarcoid myocarditis: Secondary | ICD-10-CM

## 2022-12-11 NOTE — Telephone Encounter (Signed)
-----   Message from Bright, MD sent at 12/02/2022  8:55 PM EST ----- New start for methotrexate for cardiac sarcoidosis. Goal methotrexate 20 mg weekly.

## 2022-12-11 NOTE — Telephone Encounter (Signed)
ATC patient regarding MTX. Unable to reach. Left VM requesting return call  Knox Saliva, PharmD, MPH, BCPS, CPP Clinical Pharmacist (Rheumatology and Pulmonology)

## 2022-12-11 NOTE — Telephone Encounter (Signed)
Called and spoke with patient. She states she has not been taking prednisone regularly because it makes her sleepy and "messes" with her eyes.  Attempted to contact Dr. Baird Cancer' office earlier this afternoon but was not able to wait on hold to leave a message.  Informed patient that her pulmonologist Dr. Loanne Drilling is managing the prednisone and tapering. Advised her to contact Dr. Cordelia Pen office with any questions regarding the prednisone.  Will attempt to contact Dr. Baird Cancer office again on 2/7 to share information above.  Patient verbalized understanding and expressed appreciation for follow-up.

## 2022-12-11 NOTE — Telephone Encounter (Signed)
Pt c/o medication issue:  1. Name of Medication: predniSONE (DELTASONE) 20 MG tablet   2. How are you currently taking this medication (dosage and times per day)? As prescribed  3. Are you having a reaction (difficulty breathing--STAT)? No   4. What is your medication issue? Dr. Baird Cancer is calling wanting to know if the patient should still be on this medication. He reports the patient came in with a lot of questions as to why she is still taking it as a diabetic. He reports he was given Dr. Jacolyn Reedy name to ask this question and believes the medication was prescribed in the hospital. Please advise.

## 2022-12-11 NOTE — Telephone Encounter (Incomplete)
Pharmacy Note  Subjective: Patient called today by [facility] pharmacy team for counseling on methotrexate for sarcoidosis. Prior therapy includes:***.  Objective: CBC    Component Value Date/Time   WBC 4.5 08/21/2022 1320   RBC 4.80 08/21/2022 1320   HGB 13.2 08/21/2022 1320   HCT 39.7 08/21/2022 1320   PLT 328 08/21/2022 1320   MCV 82.7 08/21/2022 1320   MCH 27.5 08/21/2022 1320   MCHC 33.2 08/21/2022 1320   RDW 13.8 08/21/2022 1320    CMP     Component Value Date/Time   NA 139 08/21/2022 1320   K 4.2 08/21/2022 1320   CL 106 08/21/2022 1320   CO2 23 08/21/2022 1320   GLUCOSE 92 08/21/2022 1320   BUN 14 08/21/2022 1320   CREATININE 0.79 08/21/2022 1320   CALCIUM 9.7 08/21/2022 1320   PROT 6.8 10/02/2022 1341   ALBUMIN 4.4 10/02/2022 1341   AST 16 10/02/2022 1341   ALT 13 10/02/2022 1341   ALKPHOS 50 10/02/2022 1341   BILITOT 1.5 (H) 10/02/2022 1341   GFRNONAA >60 08/21/2022 1320   GFRAA >60 01/10/2018 0430    Baseline Immunosuppressant Therapy Labs TB GOLD   Hepatitis Panel    Latest Ref Rng & Units 10/02/2022    1:41 PM  Hepatitis  Hep B Surface Ag Negative Negative   Hep B IgM Negative Negative   Hepatitis B core antibody - negative on 10/02/2022 Hepatitis C antibody - non reactive on 10/02/2022  HIV Lab Results  Component Value Date   HIV Non Reactive 08/22/2022    SPEP    Latest Ref Rng & Units 10/02/2022    1:41 PM  Serum Protein Electrophoresis  Total Protein 6.0 - 8.5 g/dL 6.8    G6PD No results found for: "G6PDH" TPMT No results found for: "TPMT"   Chest-xray:  ***  Contraception: ***  Alcohol use: ***  Assessment/Plan:   Patient was counseled on the purpose, proper use, and adverse effects of methotrexate including nausea, infection, and signs and symptoms of pneumonitis. Discussed that there is the possibility of an increased risk of malignancy, specifically lymphomas, but it is not well understood if this increased  risk is due to the medication or the disease state.  Instructed patient that medication should be held for infection and prior to surgery.  Advised patient to avoid live vaccines. Recommend annual influenza, Pneumovax 23, Prevnar 13, and Shingrix as indicated.   Reviewed instructions with patient to take methotrexate weekly along with folic acid daily.  Discussed the importance of frequent monitoring of kidney and liver function and blood counts, and provided patient with standing lab instructions.  Counseled patient to avoid NSAIDs and alcohol while on methotrexate. Encouraged and answered all questions.   Patient voiced understanding.  Patient verbally consented to methotrexate use.    Week Methotrexate Dose (weekly) Folic acid dose (daily) Prednisone dose (daily) Labs due  1 '10mg'$  (4 tabs) 1 tab '10mg'$    2 '10mg'$  (4 tabs) 1 tab '10mg'$  Labs due  3 '15mg'$  (6 tabs) 2 tabs 7.5   4 '15mg'$  (6 tabs) 2 tabs 7.5 Labs due  5 '20mg'$  (8 tabs) 2 tabs 5   6 and onwards 20 mg (8 tabs) 2 tabs 5 Labs due

## 2022-12-12 ENCOUNTER — Ambulatory Visit: Payer: Medicare HMO | Attending: Cardiovascular Disease | Admitting: Student

## 2022-12-12 ENCOUNTER — Encounter: Payer: Self-pay | Admitting: Student

## 2022-12-12 VITALS — BP 104/64 | HR 99 | Ht 64.0 in | Wt 135.0 lb

## 2022-12-12 DIAGNOSIS — I442 Atrioventricular block, complete: Secondary | ICD-10-CM

## 2022-12-12 DIAGNOSIS — D869 Sarcoidosis, unspecified: Secondary | ICD-10-CM | POA: Diagnosis not present

## 2022-12-12 DIAGNOSIS — I471 Supraventricular tachycardia, unspecified: Secondary | ICD-10-CM

## 2022-12-12 DIAGNOSIS — D573 Sickle-cell trait: Secondary | ICD-10-CM | POA: Diagnosis not present

## 2022-12-12 LAB — CUP PACEART INCLINIC DEVICE CHECK
Battery Remaining Longevity: 104 mo
Brady Statistic RA Percent Paced: 10 %
Brady Statistic RV Percent Paced: 25 %
Date Time Interrogation Session: 20240207110756
HighPow Impedance: 51.75 Ohm
Implantable Lead Connection Status: 753985
Implantable Lead Connection Status: 753985
Implantable Lead Implant Date: 20231018
Implantable Lead Implant Date: 20231018
Implantable Lead Location: 753859
Implantable Lead Location: 753860
Implantable Pulse Generator Implant Date: 20231018
Lead Channel Impedance Value: 362.5 Ohm
Lead Channel Impedance Value: 437.5 Ohm
Lead Channel Pacing Threshold Amplitude: 0.5 V
Lead Channel Pacing Threshold Amplitude: 1 V
Lead Channel Pacing Threshold Pulse Width: 0.5 ms
Lead Channel Pacing Threshold Pulse Width: 0.5 ms
Lead Channel Sensing Intrinsic Amplitude: 2.7 mV
Lead Channel Sensing Intrinsic Amplitude: 7.4 mV
Lead Channel Setting Pacing Amplitude: 1.25 V
Lead Channel Setting Pacing Amplitude: 1.5 V
Lead Channel Setting Pacing Pulse Width: 0.5 ms
Lead Channel Setting Sensing Sensitivity: 0.5 mV
Pulse Gen Serial Number: 211001606
Zone Setting Status: 755011

## 2022-12-12 NOTE — Patient Instructions (Signed)
Medication Instructions:  Your physician recommends that you continue on your current medications as directed. Please refer to the Current Medication list given to you today.  *If you need a refill on your cardiac medications before your next appointment, please call your pharmacy*   Lab Work: None If you have labs (blood work) drawn today and your tests are completely normal, you will receive your results only by: [place] (if you have MyChart) OR A paper copy in the mail If you have any lab test that is abnormal or we need to change your treatment, we will call you to review the results.   Follow-Up: At [facility], you and your health needs are our priority.  As part of our continuing mission to provide you with exceptional heart care, we have created designated Provider Care Teams.  These Care Teams include your primary Cardiologist (physician) and Advanced Practice Providers (APPs -  Physician Assistants and Nurse Practitioners) who all work together to provide you with the care you need, when you need it.   Your next appointment:   1 year(s)  Provider:   Doralee Albino, MD

## 2022-12-14 NOTE — Telephone Encounter (Signed)
Left message with receptionist for Dr. Baird Cancer that we are not managing patient's prednisone. This is being managed by patient's pulmonologist Dr. Loanne Drilling [phone]).

## 2022-12-17 MED ORDER — METHOTREXATE SODIUM 2.5 MG PO TABS: ORAL_TABLET | ORAL | 0 refills | Status: DC

## 2022-12-17 MED ORDER — FOLIC ACID 1 MG PO TABS: 2.0000 mg | ORAL_TABLET | Freq: Every day | ORAL | 3 refills | Status: AC

## 2022-12-17 NOTE — Telephone Encounter (Signed)
ATC patient regarding MTX new start. Left VM for patient requesting return call  Knox Saliva, PharmD, MPH, BCPS, CPP Clinical Pharmacist (Rheumatology and Pulmonology)

## 2022-12-17 NOTE — Telephone Encounter (Signed)
Pharmacy Note  Subjective: Patient called today by [facility] Pulmon[facility] pharmacy team for counseling on methotrexate for cardiac sarcoidosis. Per HRCT in Oct 2023, there was no radiologic evidence of cardiac sarcoid.  She was advised to take prednisone 22m daily at last OV with Dr. ELoanne Drillingon 11/26/2022. Patient states she stopped taking hte prednisone due to side effects - was having difficulty sleeping and "other side effects." She confirmed that she was taking in the evening but when she takes it in morning it caused other side effects like nervousness  Objective: CBC    Component Value Date/Time   WBC 4.5 08/21/2022 1320   RBC 4.80 08/21/2022 1320   HGB 13.2 08/21/2022 1320   HCT 39.7 08/21/2022 1320   PLT 328 08/21/2022 1320   MCV 82.7 08/21/2022 1320   MCH 27.5 08/21/2022 1320   MCHC 33.2 08/21/2022 1320   RDW 13.8 08/21/2022 1320    CMP     Component Value Date/Time   NA 139 08/21/2022 1320   K 4.2 08/21/2022 1320   CL 106 08/21/2022 1320   CO2 23 08/21/2022 1320   GLUCOSE 92 08/21/2022 1320   BUN 14 08/21/2022 1320   CREATININE 0.79 08/21/2022 1320   CALCIUM 9.7 08/21/2022 1320   PROT 6.8 10/02/2022 1341   ALBUMIN 4.4 10/02/2022 1341   AST 16 10/02/2022 1341   ALT 13 10/02/2022 1341   ALKPHOS 50 10/02/2022 1341   BILITOT 1.5 (H) 10/02/2022 1341   GFRNONAA >60 08/21/2022 1320   GFRAA >60 01/10/2018 0430    Baseline Immunosuppressant Therapy Labs TB GOLD   Hepatitis Panel    Latest Ref Rng & Units 10/02/2022    1:41 PM  Hepatitis  Hep B Surface Ag Negative Negative   Hep B IgM Negative Negative   Hepatitis B core antibody - negative on 10/02/2022 Hepatitis C antibody - non reactive on 10/02/2022  HIV Lab Results  Component Value Date   HIV Non Reactive 08/22/2022    SPEP    Latest Ref Rng & Units 10/02/2022    1:41 PM  Serum Protein Electrophoresis  Total Protein 6.0 - 8.5 g/dL 6.8    Chest-xray:  08/23/2022 - No evidence of pulmonary sarcoid.    Contraception: not sexually active, no sexual partners  Alcohol use: never  Assessment/Plan:   Patient was counseled on the purpose, proper use, and adverse effects of methotrexate including nausea, infection, and signs and symptoms of pneumonitis. Reviewed importance of two forms of contraception if she has change in sexual activity. Discussed that there is the possibility of an increased risk of malignancy, specifically lymphomas, but it is not well understood if this increased risk is due to the medication or the disease state.  Instructed patient that medication should be held for infection and prior to surgery.  Advised patient to avoid live vaccines. Recommend annual influenza, Pneumovax 23, Prevnar 13, and Shingrix as indicated.   Reviewed instructions with patient to take methotrexate weekly along with folic acid daily.  Discussed the importance of frequent monitoring of kidney and liver function and blood counts, and provided patient with standing lab instructions. Standing lab orders for CBC w diff and CMET placed today - [facility]  Counseled patient to avoid NSAIDs and alcohol while on methotrexate. Encouraged and answered all questions.   Patient voiced understanding.  Patient verbally consented to methotrexate use.    MyChart message sent to patient regarding methotrexate plan and labwork:  Week  Methotrexate Dose (weekly) Folic acid dose (daily) Labs due  1 73m (4 tabs) 1 tab   2 133m(4 tabs) 1 tab Labs due  3 1564m6 tabs) 2 tabs   4 48m64m tabs) 2 tabs Labs due  5 20mg72mtabs) 2 tabs   6 and onwards 20mg 42mabs) 2 tabs Labs due   Damari Suastegui Knox SalivamD, MPH, BCPS, CPP Clinical Pharmacist (Rheumatology and Pulmonology)

## 2022-12-21 DIAGNOSIS — E1142 Type 2 diabetes mellitus with diabetic polyneuropathy: Secondary | ICD-10-CM | POA: Diagnosis not present

## 2022-12-21 DIAGNOSIS — G43019 Migraine without aura, intractable, without status migrainosus: Secondary | ICD-10-CM | POA: Diagnosis not present

## 2022-12-21 DIAGNOSIS — E6609 Other obesity due to excess calories: Secondary | ICD-10-CM | POA: Diagnosis not present

## 2022-12-21 DIAGNOSIS — E782 Mixed hyperlipidemia: Secondary | ICD-10-CM | POA: Diagnosis not present

## 2022-12-21 DIAGNOSIS — J301 Allergic rhinitis due to pollen: Secondary | ICD-10-CM | POA: Diagnosis not present

## 2022-12-21 DIAGNOSIS — Z6830 Body mass index (BMI) 30.0-30.9, adult: Secondary | ICD-10-CM | POA: Diagnosis not present

## 2022-12-21 DIAGNOSIS — E1165 Type 2 diabetes mellitus with hyperglycemia: Secondary | ICD-10-CM | POA: Diagnosis not present

## 2022-12-27 ENCOUNTER — Other Ambulatory Visit: Payer: Self-pay | Admitting: Physician Assistant

## 2022-12-27 DIAGNOSIS — Z1231 Encounter for screening mammogram for malignant neoplasm of breast: Secondary | ICD-10-CM

## 2023-01-02 ENCOUNTER — Ambulatory Visit
Admission: RE | Admit: 2023-01-02 | Discharge: 2023-01-02 | Disposition: A | Discharge status: Home / Self Care | Payer: Medicare HMO | Source: Ambulatory Visit | Attending: Physician Assistant | Admitting: Physician Assistant

## 2023-01-02 DIAGNOSIS — Z1231 Encounter for screening mammogram for malignant neoplasm of breast: Secondary | ICD-10-CM | POA: Diagnosis not present

## 2023-01-04 ENCOUNTER — Other Ambulatory Visit: Payer: Self-pay | Admitting: Physician Assistant

## 2023-01-04 DIAGNOSIS — R928 Other abnormal and inconclusive findings on diagnostic imaging of breast: Secondary | ICD-10-CM

## 2023-01-16 ENCOUNTER — Ambulatory Visit
Admission: RE | Admit: 2023-01-16 | Discharge: 2023-01-16 | Disposition: A | Discharge status: Home / Self Care | Payer: Medicare HMO | Source: Ambulatory Visit | Attending: Pulmonary Disease | Admitting: Pulmonary Disease

## 2023-01-16 DIAGNOSIS — R918 Other nonspecific abnormal finding of lung field: Secondary | ICD-10-CM | POA: Diagnosis not present

## 2023-01-16 DIAGNOSIS — D8685 Sarcoid myocarditis: Secondary | ICD-10-CM

## 2023-01-16 DIAGNOSIS — R079 Chest pain, unspecified: Secondary | ICD-10-CM | POA: Diagnosis not present

## 2023-01-16 DIAGNOSIS — J90 Pleural effusion, not elsewhere classified: Secondary | ICD-10-CM | POA: Diagnosis not present

## 2023-01-16 DIAGNOSIS — R911 Solitary pulmonary nodule: Secondary | ICD-10-CM | POA: Diagnosis not present

## 2023-01-18 ENCOUNTER — Ambulatory Visit: Payer: Medicare HMO

## 2023-01-18 ENCOUNTER — Ambulatory Visit
Admission: RE | Admit: 2023-01-18 | Discharge: 2023-01-18 | Disposition: A | Discharge status: Home / Self Care | Payer: Medicare HMO | Source: Ambulatory Visit | Attending: Physician Assistant | Admitting: Physician Assistant

## 2023-01-18 DIAGNOSIS — R928 Other abnormal and inconclusive findings on diagnostic imaging of breast: Secondary | ICD-10-CM

## 2023-01-18 DIAGNOSIS — R922 Inconclusive mammogram: Secondary | ICD-10-CM | POA: Diagnosis not present

## 2023-01-22 ENCOUNTER — Encounter (HOSPITAL_BASED_OUTPATIENT_CLINIC_OR_DEPARTMENT_OTHER): Payer: Self-pay | Admitting: Pulmonary Disease

## 2023-01-22 ENCOUNTER — Other Ambulatory Visit: Payer: Self-pay | Admitting: Pulmonary Disease

## 2023-01-22 ENCOUNTER — Ambulatory Visit (INDEPENDENT_AMBULATORY_CARE_PROVIDER_SITE_OTHER): Payer: Medicare HMO | Admitting: Pulmonary Disease

## 2023-01-22 VITALS — BP 106/62 | HR 91 | Ht 64.0 in | Wt 130.0 lb

## 2023-01-22 DIAGNOSIS — D8685 Sarcoid myocarditis: Secondary | ICD-10-CM

## 2023-01-22 DIAGNOSIS — M25511 Pain in right shoulder: Secondary | ICD-10-CM

## 2023-01-22 DIAGNOSIS — Z79899 Other long term (current) drug therapy: Secondary | ICD-10-CM

## 2023-01-22 MED ORDER — METHOTREXATE SODIUM 2.5 MG PO TABS: 20.0000 mg | ORAL_TABLET | ORAL | 0 refills | Status: DC

## 2023-01-22 NOTE — Progress Notes (Signed)
Subjective:   PATIENT ID: Vicki Mccoy GENDER: female DOB: Jun 26, 1981, MRN: [id number]  Chief Complaint  Patient presents with   Follow-up    Breathing is up and down, has to stop and sit for a moment    Reason for Visit: Follow-up  [name] is a 42 year old female with CHB s/p ICD 08/2022, DM2, GERD, migraines who presents for follow-up for sarcoid  Synopsis: She was admitted in October 2023 for syncope and diagnosed with CHB s/p ICD placement. Cardiac MRI demonstrated concerns for sarcoidosis She was started prednisone taper 40 mg x 1 month followed by 30 mg daily. Tolerating well. No reports of fever, fatigue, weight loss, visual disturbance, dyspnea, cough, palpitations, abdominal pain, numbness, tingling, lightheadedness, syncope   11/26/22 She is currently on prednisone 20 mg daily. Tolerating well. Denies shortness of breath, wheezing or cough. Denies chest pain. Denies limitation in activity. Daughter has CP and she is the main caregiver. Has some occasional shoulder pain. Denies fatigue, weight loss, night sweats  01/22/23 Started on [facility] 12/20/22. No current GI issues. Did have stomach pain after doing 15 day GI cleanse while on this medication. Otherwise feels she is tolerating well. Chief complaint comments on occasional breathing issues relieved with rest however on interview patient denied respiratory symptoms. Denied shortness of breath, cough or wheezing. Her right shoulder pain has worsened and has more limited range of motion.  Social History: Never smoker Daughter with cerebral palsy  Past Medical History:  Diagnosis Date   Allergy    Arthritis    Diabetes mellitus    Sickle cell trait ([place])    URI 09/20/2010     Family History  Problem Relation Age of Onset   Sickle cell anemia Mother    Multiple sclerosis Sister    Cerebral palsy Daughter    Allergic rhinitis Neg Hx    Angioedema Neg Hx    Asthma Neg Hx    Eczema Neg Hx    Urticaria  Neg Hx    Immunodeficiency Neg Hx    Breast cancer Neg Hx      Social History   Occupational History   Occupation: Administrator, Civil Service: UNEMPLOYED    Comment: housekeeping  Tobacco Use   Smoking status: Never   Smokeless tobacco: Never  Substance and Sexual Activity   Alcohol use: No    Alcohol/week: 0.0 standard drinks of alcohol   Drug use: No   Sexual activity: Not Currently    Partners: Male    Birth control/protection: Pill    No Known Allergies   Outpatient Medications Prior to Visit  Medication Sig Dispense Refill   ACCU-CHEK GUIDE test strip USE TO CHECK BLOOD SUGAR 3 TIMES DAILY     acetaminophen (TYLENOL) 325 MG tablet Take 2 tablets (650 mg total) by mouth every 4 (four) hours as needed for headache or mild pain.     folic acid (FOLVITE) 1 MG tablet Take 2 tablets (2 mg total) by mouth daily. 180 tablet 3   metFORMIN (GLUCOPHAGE) 500 MG tablet Take 250 mg by mouth daily with breakfast.     methotrexate (RHEUMATREX) 2.5 MG tablet Take 4 tabs by mouth once weekly x 2 weeks. Then 6 tabs once weekly x 2 weeks.Then 8 tabs once weekly thereafter. 52 tablet 0   sulindac (CLINORIL) 150 MG tablet Take 150 mg by mouth 2 (two) times daily. Per patient taking daily as needed (Patient not taking: Reported on 01/22/2023)  predniSONE (DELTASONE) 20 MG tablet Take 1 tablet (20 mg total) by mouth daily with breakfast. (Patient not taking: Reported on 01/22/2023) 30 tablet 0   No facility-administered medications prior to visit.    Review of Systems  Constitutional:  Negative for chills, diaphoresis, fever, malaise/fatigue and weight loss.  HENT:  Negative for congestion.   Respiratory:  Negative for cough, hemoptysis, sputum production, shortness of breath and wheezing.   Cardiovascular:  Negative for chest pain, palpitations and leg swelling.  Musculoskeletal:  Positive for joint pain.     Objective:   Vitals:   01/22/23 0935  BP: 106/62  Pulse: 91  SpO2: 99%   Weight: 130 lb (59 kg)  Height: 5\' 4"  (1.626 m)  SpO2: 99 % O2 Device: None (Room air)  Physical Exam: General: Well-appearing, no acute distress HENT: [place], AT Eyes: EOMI, no scleral icterus Respiratory: Clear to auscultation bilaterally.  No crackles, wheezing or rales Cardiovascular: RRR, -M/R/G, no JVD Extremities:-Edema,-tenderness Musculoskeletal: Active right shoulder abduction 120*, flexion 90* Neuro: AAO x4, CNII-XII grossly intact Psych: Normal mood, normal affect   Data Reviewed:  Imaging: MR Cardiac 08/22/22 - Concerning for cardiac sarcoid CT Chest 08/23/22 - No mediastinal or hilar adenopathy. Scattered subcentimeter pulmonary nodules with largest 6 mm in RUL CT Chest 01/16/23 - Minimal/mild scattered peribronchovascular nodularity. Stable nodules including RUL measuring 6 mm. Tiny bilateral pleural effusions.  PFT: 09/20/15 FVC 2.52 (107%) FEV1 2.18 (99%) Ratio 86   Interpretation: Normal spirometry  Labs:    Latest Ref Rng & Units 08/21/2022    1:20 PM 05/28/2022    2:42 PM 01/10/2018    4:30 AM  CBC  WBC 4.0 - 10.5 K/uL 4.5  5.5  6.6   Hemoglobin 12.0 - 15.0 g/dL 13.2  12.6  12.3   Hematocrit 36.0 - 46.0 % 39.7  37.0  38.0   Platelets 150 - 400 K/uL 328  280  280       Latest Ref Rng & Units 08/21/2022    1:20 PM 05/28/2022    2:42 PM 01/10/2018    4:30 AM  BMP  Glucose 70 - 99 mg/dL 92  98  147   BUN 6 - 20 mg/dL 14  12  10    Creatinine 0.44 - 1.00 mg/dL 0.79  0.95  0.97   Sodium 135 - 145 mmol/L 139  137  141   Potassium 3.5 - 5.1 mmol/L 4.2  3.7  4.2   Chloride 98 - 111 mmol/L 106  107  107   CO2 22 - 32 mmol/L 23  23  22    Calcium 8.9 - 10.3 mg/dL 9.7  9.2  8.9       Latest Ref Rng & Units 10/02/2022    1:41 PM 01/10/2018    4:30 AM  Hepatic Function  Total Protein 6.0 - 8.5 g/dL 6.8  7.0   Albumin 3.9 - 4.9 g/dL 4.4  3.6   AST 0 - 40 IU/L 16  18   ALT 0 - 32 IU/L 13  12   Alk Phosphatase 44 - 121 IU/L 50  48   Total Bilirubin 0.0 - 1.2  mg/dL 1.5  0.6   Bilirubin, Direct 0.00 - 0.40 mg/dL 0.38          Assessment & Plan:   Discussion: 42 year old female with CHB s/p ICD 08/2022, DM2, GERD, migraines who presents for sarcoid follow-up. On immunosuppressants for active cardiac sarcoid.   We discussed the clinical course  of sarcoid and management including serial PFTs, labs, eye exam, and EKG and chest imaging if indicated.   Reviewed risks and benefits of methotrexate therapy. Main adverse effects including but not limited to increased risk of infection, liver toxicity, bone marrow suppression.  Suspected cardiac sarcoidosis Complete heart block s/p ICD 08/2022 --Cardiac MRI 08/22/22 concerning for sarcoidosis --Prednisone 11/26/21 - self-discontinued  --CONTINUE methotrexate with goal 20 mg weekly --Will plan for cardiac PET six months after initiation of methotrexate (Sept 2024). Will order at next visit  History of immunosuppression High risk medication management --Prednisone 08/23/22-current. Initially 40 mg --Prednisone 11/26/21 10 mg daily --Starting methotrexate 12/20/2022 >current --Continue methotrexate 20 mg weekly  Sarcoid Monitoring --Recent chest imaging reviewed.  --Annual PFTs.  Last PFTs 09/2015 --Annual ophthalmology exam.  Last visit on 2023 --Recent EKG and TTE reviewed. 08/22/22 Normal except for CHB --Routine labs as needed: CBC with diff, CMET  Subcentimeter pulmonary nodules with largest 6 mm in RUL - stable --CT Chest 01/16/23 reviewed with stable nodules  Insomnia due to steroids - off steroids --Buy over the counter melatonin 2 mg nightly for sleep as needed  Right shoulder pain, limited ROM --Refer to Orthopedics, prefers [facility] location  Hx latent TB Treated x 6 months with health department at 42 years old. Contracted from exposure in the [place] (reports Spanish speaking friend).  Consider referral to ID for potential need with TNF alpha in the future   Health  Maintenance Immunization History  Administered Date(s) Administered   Influenza Whole 10/06/2008, 11/01/2009, 09/20/2010   Td 06/20/2010   CT Lung Screen - not qualified. Insufficient tobacco history  Orders Placed This Encounter  Procedures   Ambulatory referral to Orthopedics    Referral Priority:   Urgent    Referral Type:   Consultation    Referred to Provider:   Vanetta Mulders, MD    Number of Visits Requested:   1   Meds ordered this encounter  Medications   methotrexate (RHEUMATREX) 2.5 MG tablet    Sig: Take 8 tablets (20 mg total) by mouth once a week. Start after completing your current bottle    Dispense:  64 tablet    Refill:  0   Return in about 2 months (around 03/24/2023) for routine follow-up, with Dr. Loanne Drilling.  I have spent a total time of 38-minutes on the day of the appointment including chart review, data review, collecting history, coordinating care and discussing medical diagnosis and plan with the patient/family. Past medical history, allergies, medications were reviewed. Pertinent imaging, labs and tests included in this note have been reviewed and interpreted independently by me.  [place], MD [facility] 01/22/2023 8:30 PM  Office Number [phone]

## 2023-01-22 NOTE — Patient Instructions (Addendum)
Sarcoid High risk medical management  --Routine labs as needed: CBC with diff, CMET  Right shoulder pain, limited ROM --Refer to Orthopedics, prefers [facility] location

## 2023-01-23 LAB — CBC WITH DIFFERENTIAL/PLATELET
Basophils Absolute: 0 10*3/uL (ref 0.0–0.2)
Basos: 0 %
EOS (ABSOLUTE): 0.2 10*3/uL (ref 0.0–0.4)
Eos: 4 %
Hematocrit: 37.3 % (ref 34.0–46.6)
Hemoglobin: 12.6 g/dL (ref 11.1–15.9)
Immature Grans (Abs): 0 10*3/uL (ref 0.0–0.1)
Immature Granulocytes: 0 %
Lymphocytes Absolute: 0.7 10*3/uL (ref 0.7–3.1)
Lymphs: 14 %
MCH: 29.4 pg (ref 26.6–33.0)
MCHC: 33.8 g/dL (ref 31.5–35.7)
MCV: 87 fL (ref 79–97)
Monocytes Absolute: 0.3 10*3/uL (ref 0.1–0.9)
Monocytes: 7 %
Neutrophils Absolute: 3.7 10*3/uL (ref 1.4–7.0)
Neutrophils: 75 %
Platelets: 278 10*3/uL (ref 150–450)
RBC: 4.29 x10E6/uL (ref 3.77–5.28)
RDW: 13.8 % (ref 11.7–15.4)
WBC: 4.9 10*3/uL (ref 3.4–10.8)

## 2023-01-23 LAB — COMPREHENSIVE METABOLIC PANEL
ALT: 11 IU/L (ref 0–32)
AST: 14 IU/L (ref 0–40)
Albumin/Globulin Ratio: 1.8 (ref 1.2–2.2)
Albumin: 4.4 g/dL (ref 3.9–4.9)
Alkaline Phosphatase: 59 IU/L (ref 44–121)
BUN/Creatinine Ratio: 16 (ref 9–23)
BUN: 17 mg/dL (ref 6–24)
Bilirubin Total: 0.9 mg/dL (ref 0.0–1.2)
CO2: 20 mmol/L (ref 20–29)
Calcium: 9.8 mg/dL (ref 8.7–10.2)
Chloride: 103 mmol/L (ref 96–106)
Creatinine, Ser: 1.07 mg/dL — ABNORMAL HIGH (ref 0.57–1.00)
Globulin, Total: 2.4 g/dL (ref 1.5–4.5)
Glucose: 91 mg/dL (ref 70–99)
Potassium: 4.5 mmol/L (ref 3.5–5.2)
Sodium: 139 mmol/L (ref 134–144)
Total Protein: 6.8 g/dL (ref 6.0–8.5)
eGFR: 66 mL/min/{1.73_m2} (ref >59)

## 2023-01-30 ENCOUNTER — Ambulatory Visit (INDEPENDENT_AMBULATORY_CARE_PROVIDER_SITE_OTHER): Payer: Medicare HMO

## 2023-01-30 ENCOUNTER — Ambulatory Visit (INDEPENDENT_AMBULATORY_CARE_PROVIDER_SITE_OTHER): Payer: Medicare HMO | Admitting: Orthopaedic Surgery

## 2023-01-30 DIAGNOSIS — G8929 Other chronic pain: Secondary | ICD-10-CM

## 2023-01-30 DIAGNOSIS — M25511 Pain in right shoulder: Secondary | ICD-10-CM | POA: Diagnosis not present

## 2023-01-30 DIAGNOSIS — M7581 Other shoulder lesions, right shoulder: Secondary | ICD-10-CM

## 2023-01-30 NOTE — Progress Notes (Signed)
Chief Complaint: Right shoulder pain     History of Present Illness:    Vicki Mccoy is a 42 y.o. female right-hand-dominant female who presents with 4 months of right shoulder pain.  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  This is the majority of her job.  She is very busy with this.  She does have pain with overhead activity or any type of lifting.    Surgical History:   none  PMH/PSH/Family History/Social History/Meds/Allergies:    Past Medical History:  Diagnosis Date   Allergy    Arthritis    Diabetes mellitus    Sickle cell trait ([place])    URI 09/20/2010   Past Surgical History:  Procedure Laterality Date   ICD IMPLANT N/A 08/22/2022   Procedure: ICD IMPLANT;  Surgeon: Melida Quitter, MD;  Location: [facility];  Service: Cardiovascular;  Laterality: N/A;   Social History   Socioeconomic History   Marital status: Single    Spouse name: n/a   Number of children: 1   Years of education: 12   Highest education level: Not on file  Occupational History   Occupation: Administrator, Civil Service: UNEMPLOYED    Comment: housekeeping  Tobacco Use   Smoking status: Never   Smokeless tobacco: Never  Substance and Sexual Activity   Alcohol use: No    Alcohol/week: 0.0 standard drinks of alcohol   Drug use: No   Sexual activity: Not Currently    Partners: Male    Birth control/protection: Pill  Other Topics Concern   Not on file  Social History Narrative   Lives with her daughter.  Family is nearby.   Social Determinants of Health   Financial Resource Strain: Not on file  Food Insecurity: No Food Insecurity (08/22/2022)   Hunger Vital Sign    Worried About Running Out of Food in the Last Year: Never true    Ran Out of Food in the Last Year: Never true  Transportation Needs: No Transportation Needs (08/22/2022)   PRAPARE - Hydrologist (Medical): No     Lack of Transportation (Non-Medical): No  Physical Activity: Not on file  Stress: Not on file  Social Connections: Not on file   Family History  Problem Relation Age of Onset   Sickle cell anemia Mother    Multiple sclerosis Sister    Cerebral palsy Daughter    Allergic rhinitis Neg Hx    Angioedema Neg Hx    Asthma Neg Hx    Eczema Neg Hx    Urticaria Neg Hx    Immunodeficiency Neg Hx    Breast cancer Neg Hx    No Known Allergies Current Outpatient Medications  Medication Sig Dispense Refill   ACCU-CHEK GUIDE test strip USE TO CHECK BLOOD SUGAR 3 TIMES DAILY     acetaminophen (TYLENOL) 325 MG tablet Take 2 tablets (650 mg total) by mouth every 4 (four) hours as needed for headache or mild pain.     folic acid (FOLVITE) 1 MG tablet Take 2 tablets (2 mg total) by mouth daily. 180 tablet 3   metFORMIN (GLUCOPHAGE) 500 MG tablet Take 250 mg by mouth daily with breakfast.     methotrexate (RHEUMATREX) 2.5 MG tablet  Take 8 tablets (20 mg total) by mouth once a week. Start after completing your current bottle 64 tablet 0   sulindac (CLINORIL) 150 MG tablet Take 150 mg by mouth 2 (two) times daily. Per patient taking daily as needed (Patient not taking: Reported on 01/22/2023)     No current facility-administered medications for this visit.   No results found.  Review of Systems:   A ROS was performed including pertinent positives and negatives as documented in the HPI.  Physical Exam :   Constitutional: NAD and appears stated age Neurological: Alert and oriented Psych: Appropriate affect and cooperative There were no vitals taken for this visit.   Comprehensive Musculoskeletal Exam:    Right shoulder tenderness to palpation over the lateral deltoid.  Forward elevation is 270 degrees bilaterally.  External rotation size 45.  Positive Neer impingement on the right  Imaging:   Xray (3 views right shoulder): Normal    I personally reviewed and interpreted the  radiographs.   Assessment:   42 y.o. female with right shoulder pain consistent with rotator cuff tendinitis.  I did describe that this is typically self-limiting.  I did give her specific series of exercises that I I believe will help to resolve her pain symptoms sooner.  I did offer an injection although she is hesitant to do this that she did have an experience where she had significant swelling and follows previous injection.  Plan :    -She will return to clinic as needed     I personally saw and evaluated the patient, and participated in the management and treatment plan.  Vanetta Mulders, MD Attending Physician, Orthopedic Surgery  This document was dictated using Dragon voice recognition software. A reasonable attempt at proof reading has been made to minimize errors.

## 2023-02-11 ENCOUNTER — Ambulatory Visit: Payer: Medicare HMO | Admitting: Neurology

## 2023-02-18 ENCOUNTER — Other Ambulatory Visit: Payer: Self-pay | Admitting: Neurology

## 2023-02-18 DIAGNOSIS — G43839 Menstrual migraine, intractable, without status migrainosus: Secondary | ICD-10-CM

## 2023-02-21 ENCOUNTER — Telehealth (HOSPITAL_BASED_OUTPATIENT_CLINIC_OR_DEPARTMENT_OTHER): Payer: Self-pay | Admitting: Pulmonary Disease

## 2023-02-21 DIAGNOSIS — D8685 Sarcoid myocarditis: Secondary | ICD-10-CM

## 2023-02-21 DIAGNOSIS — Z79899 Other long term (current) drug therapy: Secondary | ICD-10-CM

## 2023-02-21 NOTE — Telephone Encounter (Signed)
Called pt but unable to reach. Left message for her to return call. 

## 2023-02-21 NOTE — Telephone Encounter (Signed)
Patient states she is out of refills for methotrexate but wanted to ask if she needed to keep taking it since she is having symptoms of severe itchiness especially when exposed to the sun or when she starts sweating. Please advise.  Pharmacy: [facility] [address]

## 2023-02-22 ENCOUNTER — Ambulatory Visit (INDEPENDENT_AMBULATORY_CARE_PROVIDER_SITE_OTHER): Payer: Medicare HMO

## 2023-02-22 DIAGNOSIS — I442 Atrioventricular block, complete: Secondary | ICD-10-CM

## 2023-02-22 LAB — CUP PACEART REMOTE DEVICE CHECK
Battery Remaining Longevity: 99 mo
Battery Remaining Percentage: 92 %
Battery Voltage: 3.01 V
Brady Statistic AP VP Percent: 6 %
Brady Statistic AP VS Percent: 1 %
Brady Statistic AS VP Percent: 88 %
Brady Statistic AS VS Percent: 4.9 %
Brady Statistic RA Percent Paced: 6.5 %
Brady Statistic RV Percent Paced: 94 %
Date Time Interrogation Session: 20240418220728
HighPow Impedance: 55 Ohm
Implantable Lead Connection Status: 753985
Implantable Lead Connection Status: 753985
Implantable Lead Implant Date: 20231018
Implantable Lead Implant Date: 20231018
Implantable Lead Location: 753859
Implantable Lead Location: 753860
Implantable Pulse Generator Implant Date: 20231018
Lead Channel Impedance Value: 360 Ohm
Lead Channel Impedance Value: 410 Ohm
Lead Channel Pacing Threshold Amplitude: 0.5 V
Lead Channel Pacing Threshold Amplitude: 0.875 V
Lead Channel Pacing Threshold Pulse Width: 0.5 ms
Lead Channel Pacing Threshold Pulse Width: 0.5 ms
Lead Channel Sensing Intrinsic Amplitude: 3 mV
Lead Channel Sensing Intrinsic Amplitude: 6.1 mV
Lead Channel Setting Pacing Amplitude: 1.125
Lead Channel Setting Pacing Amplitude: 1.5 V
Lead Channel Setting Pacing Pulse Width: 0.5 ms
Lead Channel Setting Sensing Sensitivity: 0.5 mV
Pulse Gen Serial Number: 211001606
Zone Setting Status: 755011

## 2023-02-22 MED ORDER — METHOTREXATE SODIUM 2.5 MG PO TABS: 20.0000 mg | ORAL_TABLET | ORAL | 0 refills | Status: DC

## 2023-02-22 NOTE — Telephone Encounter (Signed)
Please advise on refill for methotrexate

## 2023-02-22 NOTE — Progress Notes (Unsigned)
NEUROLOGY FOLLOW UP OFFICE NOTE  Vicki Mccoy [id number]  Assessment/Plan:   Menstrual migraine, stable   Migraine prevention:  Topiramate  at bedtime ***  Due to recent heart block, would like to avoid triptans.  *** Limit use of pain relievers to no more than 2 days out of week to prevent risk of rebound or medication-overuse headache. Keep headache diary Follow up 5 months       Subjective:  Vicki Mccoy is a 42 year old female with sarcoidosis with cardiac involvement (heart block s/p ICD), DM 2 with polyneuropathy, arthritis and Sickle cell trait who follows up for migraines.   UPDATE: Vicki Mccoy ***   Current NSAIDS/analgesics:  none Current triptans: Would avoid due to heart block Current ergotamine:  none Current anti-emetic:  none Current muscle relaxants:  none Current Antihypertensive medications:  none Current Antidepressant medications:  none Current Anticonvulsant medications:  topiramate  at bedtime Current anti-CGRP:  none Current Vitamins/Herbal/Supplements:  D Current Antihistamines/Decongestants:  none Other therapy:  none     Caffeine:  coffee occasionally.  Occasional soda Alcohol:  no Smoker:  no Diet:  5-6 bottles water daily.  Rarely soda.   Exercise:  walks Depression:  no; Anxiety:  no Other pain:  no Sleep hygiene:  good   HISTORY:  Onset:  Menstrual migraines as a teenager  They subsequently resolved but returned about 2 months ago.   Location:  left temple radiating into eye Quality:  throbbing Intensity:  10/10 Aura:  absent Prodrome:  absent Associated symptoms:  Photophobia, sometimes blurred vision.  She denies associated nausea, vomiting, phonophobia, osmophobia, autonomic symptoms, unilateral numbness or weakness. Duration:  3-4 days.  Aborts quickly with [facility] but pain will returns 8-9 hours later Frequency:  Occurs with her cycle.  Starts one day before menses Triggers:  Menses Relieving factors:  BC  powder Activity:  aggravates     Past NSAIDS/analgesics:  naproxen, ibuprofen, Tylenol, Excedrin igraine Past abortive triptans:  sumatriptan tab, zolmitriptan (dizziness) Past abortive ergotamine:  none Past muscle relaxants:  cyclobenzaprine Past anti-emetic:  none Past antihypertensive medications:  furosemide Past antidepressant medications:  none Past anticonvulsant medications:  gabapentin Past anti-CGRP:  none Past vitamins/Herbal/Supplements:  none Past antihistamines/decongestants:  meclizine, Zyrtec, Flonase Other past therapies:  none   PAST MEDICAL HISTORY: Past Medical History:  Diagnosis Date   Allergy    Arthritis    Diabetes mellitus    Sickle cell trait ([facility])    URI 09/20/2010    MEDICATIONS: Current Outpatient Medications on File Prior to Visit  Medication Sig Dispense Refill   ACCU-CHEK GUIDE test strip USE TO CHECK BLOOD SUGAR 3 TIMES DAILY     acetaminophen (TYLENOL) 325 MG tablet Take 2 tablets (650 mg total) by mouth every 4 (four) hours as needed for headache or mild pain.     folic acid (FOLVITE) 1 MG tablet Take 2 tablets (2 mg total) by mouth daily. 180 tablet 3   metFORMIN (GLUCOPHAGE) 500 MG tablet Take 250 mg by mouth daily with breakfast.     methotrexate (RHEUMATREX) 2.5 MG tablet Take 8 tablets (20 mg total) by mouth once a week. Start after completing your current bottle 64 tablet 0   sulindac (CLINORIL) 150 MG tablet Take 150 mg by mouth 2 (two) times daily. Per patient taking daily as needed (Patient not taking: Reported on 01/22/2023)     No current facility-administered medications on file prior to visit.    ALLERGIES: No Known Allergies  FAMILY HISTORY: Family History  Problem Relation Age of Onset   Sickle cell anemia Mother    Multiple sclerosis Sister    Cerebral palsy Daughter    Allergic rhinitis Neg Hx    Angioedema Neg Hx    Asthma Neg Hx    Eczema Neg Hx    Urticaria Neg Hx    Immunodeficiency Neg Hx    Breast  cancer Neg Hx       Objective:  *** General: No acute distress.  Patient appears well-groomed.   Head:  Normocephalic/atraumatic Eyes:  Fundi examined but not visualized Neck: supple, no paraspinal tenderness, full range of motion Heart:  Regular rate and rhythm Lungs:  Clear to auscultation bilaterally Back: No paraspinal tenderness Neurological Exam: ***   Shon Millet, DO  CC: ***

## 2023-02-22 NOTE — Telephone Encounter (Signed)
Contacted patient regarding refill and itchiness. She reports a mild itchiness rated as 1 on a scale from 1-10 Notices it worsens after being exposed in the sun but no evidence of rash  Recommend continuing methotrexate 20 mg (8 tablets) weekly for now. Refill placed Recommend benadryl PRN and limiting sun exposure and clothing for sun protection  Will plan to decrease to 15 mg weekly at next visit if symptoms persist or worsen

## 2023-02-22 NOTE — Telephone Encounter (Signed)
Patient returning call please advise and call patient back.

## 2023-02-26 ENCOUNTER — Encounter: Payer: Self-pay | Admitting: Neurology

## 2023-02-26 ENCOUNTER — Ambulatory Visit (INDEPENDENT_AMBULATORY_CARE_PROVIDER_SITE_OTHER): Payer: Medicare HMO | Admitting: Neurology

## 2023-02-26 VITALS — BP 126/68 | HR 95 | Ht 64.0 in | Wt 128.8 lb

## 2023-02-26 DIAGNOSIS — G43829 Menstrual migraine, not intractable, without status migrainosus: Secondary | ICD-10-CM | POA: Diagnosis not present

## 2023-02-26 NOTE — Patient Instructions (Signed)
When you get a migraine attack, take Nurtec (1 tablet in 24 hours).  Let me know if you prefer Nurtec or Bernita Raisin

## 2023-02-28 ENCOUNTER — Telehealth: Payer: Self-pay | Admitting: Cardiovascular Disease

## 2023-02-28 NOTE — Telephone Encounter (Signed)
Patient reports new onset of chest pressure with movement, bending over. She denies SOB but states she feels tightness with deep breath. Denies N/V and diaphoresis. Scheduled with DOD for 03/06/2023. Reviewed when to go to ED or call 911 for urgent evaluation. Patient verbalized understanding and had no questions.

## 2023-02-28 NOTE — Telephone Encounter (Signed)
Pt c/o of Chest Pain: STAT if CP now or developed within 24 hours  1. Are you having CP right now? No  2. Are you experiencing any other symptoms (ex. SOB, nausea, vomiting, sweating)? A little sweaty this morning  3. How long have you been experiencing CP? This morning  4. Is your CP continuous or coming and going? Coming and going, only when the pt bends over or takes a deep breathe  5. Have you taken Nitroglycerin? No  Pt stated they are experiencing tightness in their chest when they take a deep breathe or bend over. ?

## 2023-03-06 ENCOUNTER — Encounter: Payer: Self-pay | Admitting: Cardiovascular Disease

## 2023-03-06 ENCOUNTER — Ambulatory Visit (HOSPITAL_BASED_OUTPATIENT_CLINIC_OR_DEPARTMENT_OTHER): Payer: Medicare HMO

## 2023-03-06 ENCOUNTER — Ambulatory Visit: Payer: Medicare HMO | Attending: Cardiovascular Disease | Admitting: Cardiovascular Disease

## 2023-03-06 VITALS — BP 122/82 | HR 55 | Ht 64.0 in | Wt 126.6 lb

## 2023-03-06 DIAGNOSIS — I442 Atrioventricular block, complete: Secondary | ICD-10-CM

## 2023-03-06 DIAGNOSIS — R079 Chest pain, unspecified: Secondary | ICD-10-CM

## 2023-03-06 DIAGNOSIS — D573 Sickle-cell trait: Secondary | ICD-10-CM | POA: Diagnosis not present

## 2023-03-06 DIAGNOSIS — R9431 Abnormal electrocardiogram [ECG] [EKG]: Secondary | ICD-10-CM | POA: Diagnosis not present

## 2023-03-06 NOTE — Patient Instructions (Addendum)
Medication Instructions:  Your physician recommends that you continue on your current medications as directed. Please refer to the Current Medication list given to you today. *If you need a refill on your cardiac medications before your next appointment, please call your pharmacy*   Testing/Procedures: Echocardiogram - as soon as possible Your physician has requested that you have an echocardiogram. Echocardiography is a painless test that uses sound waves to create images of your heart. It provides your doctor with information about the size and shape of your heart and how well your heart's chambers and valves are working. This procedure takes approximately one hour. There are no restrictions for this procedure. Please do NOT wear cologne, perfume, aftershave, or lotions (deodorant is allowed). Please arrive 15 minutes prior to your appointment time.   Follow-Up: At [facility], you and your health needs are our priority.  As part of our continuing mission to provide you with exceptional heart care, we have created designated Provider Care Teams.  These Care Teams include your primary Cardiologist (physician) and Advanced Practice Providers (APPs -  Physician Assistants and Nurse Practitioners) who all work together to provide you with the care you need, when you need it.  We recommend signing up for the patient portal called "MyChart".  Sign up information is provided on this After Visit Summary.  MyChart is used to connect with patients for Virtual Visits (Telemedicine).  Patients are able to view lab/test results, encounter notes, upcoming appointments, etc.  Non-urgent messages can be sent to your provider as well.   To learn more about what you can do with MyChart, go to [email or web address].    Your next appointment:   After echocardiogram  Provider:   Needs to set up with a general cardiologist

## 2023-03-06 NOTE — Progress Notes (Signed)
Electrophysiology Office Note Date: 03/06/2023  ID:  Vicki Mccoy, DOB 06/18/1981, MRN [id number]  PCP: Norm Salt, PA Primary Cardiologist: None Electrophysiologist: Maurice Small, MD   CC: Routine ICD follow-up  Vicki Mccoy is a 42 y.o. female seen today as an urgent visit for chest pain.   She was admitted in October 2023 with complete heart block and a Abbott dual-chamber ICD placed.  She received a defibrillator because her imaging was concerning for cardiac sarcoid.  Last Thursday she awoke with coughing, followed by chest pain.  She describes the chest pain as a tightness that was worse with respiration and bending over as well as occasional sharp pains located in the right anterior chest.  The pain was constant and lasted all day.  She has never had chest discomfort like this before or since.  She has not had ICD shocks.   Device History: Abbott dual chamber ICD implanted 08/22/22   Past Medical History:  Diagnosis Date   Allergy    Arthritis    Diabetes mellitus    Sickle cell trait (HCC)    URI 09/20/2010    Current Outpatient Medications  Medication Instructions   ACCU-CHEK GUIDE test strip USE TO CHECK BLOOD SUGAR 3 TIMES DAILY   acetaminophen (TYLENOL) 650 mg, Oral, Every 4 hours PRN   cetirizine (ZYRTEC) 10 mg, Oral, Daily   diclofenac Sodium (VOLTAREN) 2 g, Topical, As needed   folic acid (FOLVITE) 2 mg, Oral, Daily   metFORMIN (GLUCOPHAGE) 250 mg, Oral, Daily with breakfast   methotrexate (RHEUMATREX) 20 mg, Oral, Weekly, Start after completing your current bottle   sulindac (CLINORIL) 150 mg, Oral, 2 times daily, Per patient taking daily as needed    Family History: Family History  Problem Relation Age of Onset   Sickle cell anemia Mother    Multiple sclerosis Sister    Cerebral palsy Daughter    Allergic rhinitis Neg Hx    Angioedema Neg Hx    Asthma Neg Hx    Eczema Neg Hx    Urticaria Neg Hx    Immunodeficiency Neg Hx     Breast cancer Neg Hx     Physical Exam: Vitals:   03/06/23 1034  BP: 122/82  Pulse: (!) 55  SpO2: 99%  Weight: 126 lb 9.6 oz (57.4 kg)  Height: 5\' 4"  (1.626 m)      GEN- NAD. A&O x 3. Normal affect. HEENT: Normocephalic, atraumatic Lungs- CTAB, Normal effort.  Heart- Regular rate and rhythm rate and rhythm. No M/G/R.  Extremities- No peripheral edema. no clubbing or cyanosis Skin- warm and dry, no rash or lesion; ICD pocket well healed MSK - no tenderness to palpation of the thoraz  ICD interrogation- reviewed in detail today,  See PACEART report  Other studies Reviewed: Additional studies/ records that were reviewed today include: Previous EP office notes.   cMRI 1.  Normal LV size with EF 63%, normal wall motion. 2.  Normal RV size with EF 63%. 3. Nodular mid-wall LGE in the mid septum and the mid anterior wall (smaller). 4.  Nonspecific mild elevation in extracellular volume percentage. 5.  Borderline elevated T2 signal in the mid septum (54).   The appearance of LGE (nodular and circumscribed) is concerning for cardiac sarcoidosis.  ECG today - sinus bradycardia, incomplete RBBB, septal infarct. ST & T wave abnormality -- new  Assessment and Plan:  Chest pain Pleuritic in quality, resolved, occurred a week ago, and she has  not had recurrence  ECG shows new lateral TWI today which is new. Since she has been asymptomatic for several days now, I do not see a strong indication to send her to the ER, but I will arrange an urgent echo  CHB s/p Abbott dual chamber ICD  euvolemic today Normal ICD function She is 92% V-paced Check TTE to assess LV function with high burden of pacing. See Pace Art report No changes today  2. Suspected Sarcoidosis Following with Pulmonary as well.  Recommending annual PFTs and ophthalmology.   3. SVT Multiple, short episodes noted.     Signed, Maurice Small, MD  03/06/2023 11:05 AM  [facility] [address] [place] [address] [phone] (office) [phone] (fax)

## 2023-03-07 LAB — ECHOCARDIOGRAM COMPLETE
Area-P 1/2: 3.91 cm2
Height: 64 in
P 1/2 time: 573 msec
S' Lateral: 3.3 cm
Weight: 2025.6 oz

## 2023-03-08 ENCOUNTER — Other Ambulatory Visit: Payer: Self-pay

## 2023-03-08 DIAGNOSIS — I442 Atrioventricular block, complete: Secondary | ICD-10-CM

## 2023-03-08 DIAGNOSIS — R079 Chest pain, unspecified: Secondary | ICD-10-CM

## 2023-03-10 ENCOUNTER — Other Ambulatory Visit: Payer: Self-pay | Admitting: Pulmonary Disease

## 2023-03-10 DIAGNOSIS — Z79899 Other long term (current) drug therapy: Secondary | ICD-10-CM

## 2023-03-10 DIAGNOSIS — D8685 Sarcoid myocarditis: Secondary | ICD-10-CM

## 2023-03-21 DIAGNOSIS — E1165 Type 2 diabetes mellitus with hyperglycemia: Secondary | ICD-10-CM | POA: Diagnosis not present

## 2023-03-21 DIAGNOSIS — L299 Pruritus, unspecified: Secondary | ICD-10-CM | POA: Diagnosis not present

## 2023-03-21 DIAGNOSIS — Z0000 Encounter for general adult medical examination without abnormal findings: Secondary | ICD-10-CM | POA: Diagnosis not present

## 2023-03-21 DIAGNOSIS — R634 Abnormal weight loss: Secondary | ICD-10-CM | POA: Diagnosis not present

## 2023-03-21 DIAGNOSIS — J301 Allergic rhinitis due to pollen: Secondary | ICD-10-CM | POA: Diagnosis not present

## 2023-03-21 DIAGNOSIS — E1142 Type 2 diabetes mellitus with diabetic polyneuropathy: Secondary | ICD-10-CM | POA: Diagnosis not present

## 2023-03-21 DIAGNOSIS — E6609 Other obesity due to excess calories: Secondary | ICD-10-CM | POA: Diagnosis not present

## 2023-03-21 DIAGNOSIS — E782 Mixed hyperlipidemia: Secondary | ICD-10-CM | POA: Diagnosis not present

## 2023-03-21 DIAGNOSIS — G43019 Migraine without aura, intractable, without status migrainosus: Secondary | ICD-10-CM | POA: Diagnosis not present

## 2023-03-22 NOTE — Progress Notes (Signed)
Remote ICD transmission.   

## 2023-03-29 ENCOUNTER — Ambulatory Visit (INDEPENDENT_AMBULATORY_CARE_PROVIDER_SITE_OTHER): Payer: Medicare HMO | Admitting: Pulmonary Disease

## 2023-03-29 ENCOUNTER — Encounter (HOSPITAL_BASED_OUTPATIENT_CLINIC_OR_DEPARTMENT_OTHER): Payer: Self-pay | Admitting: Pulmonary Disease

## 2023-03-29 VITALS — BP 102/60 | HR 55 | Temp 98.8°F | Ht 64.0 in | Wt 125.6 lb

## 2023-03-29 DIAGNOSIS — Z79899 Other long term (current) drug therapy: Secondary | ICD-10-CM

## 2023-03-29 DIAGNOSIS — D8685 Sarcoid myocarditis: Secondary | ICD-10-CM

## 2023-03-29 MED ORDER — METHOTREXATE SODIUM 2.5 MG PO TABS: 15.0000 mg | ORAL_TABLET | ORAL | 0 refills | Status: DC

## 2023-03-29 NOTE — Progress Notes (Unsigned)
Subjective:   PATIENT ID: Vicki Mccoy: female DOB: Dec 31, 1980, MRN: [id number]  No chief complaint on file.   Reason for Visit: Follow-up  Vicki Mccoy is a 42 year old female with CHB s/p ICD 08/2022, DM2, GERD, migraines who presents for follow-up for sarcoid  Synopsis: She was admitted in October 2023 for syncope and diagnosed with CHB s/p ICD placement. Cardiac MRI demonstrated concerns for sarcoidosis She was started prednisone taper 40 mg x 1 month followed by 30 mg daily. Tolerating well. No reports of fever, fatigue, weight loss, visual disturbance, dyspnea, cough, palpitations, abdominal pain, numbness, tingling, lightheadedness, syncope   11/26/22 She is currently on prednisone 20 mg daily. Tolerating well. Denies shortness of breath, wheezing or cough. Denies chest pain. Denies limitation in activity. Daughter has CP and she is the main caregiver. Has some occasional shoulder pain. Denies fatigue, weight loss, night sweats  01/22/23 Started on [facility] 12/20/22. No current GI issues. Did have stomach pain after doing 15 day GI cleanse while on this medication. Otherwise feels she is tolerating well. Chief complaint comments on occasional breathing issues relieved with rest however on interview patient denied respiratory symptoms. Denied shortness of breath, cough or wheezing. Her right shoulder pain has worsened and has more limited range of motion.  03/29/23 Since our last visit she tolerating methotrexate except for skin itching with sun exposure. Benadryl has not helped.   Social History: Never smoker Daughter with cerebral palsy  Past Medical History:  Diagnosis Date   Allergy    Arthritis    Diabetes mellitus    Sickle cell trait (HCC)    URI 09/20/2010     Family History  Problem Relation Age of Onset   Sickle cell anemia Mother    Multiple sclerosis Sister    Cerebral palsy Daughter    Allergic rhinitis Neg Hx    Angioedema Neg Hx     Asthma Neg Hx    Eczema Neg Hx    Urticaria Neg Hx    Immunodeficiency Neg Hx    Breast cancer Neg Hx      Social History   Occupational History   Occupation: Technical sales engineer: UNEMPLOYED    Comment: housekeeping  Tobacco Use   Smoking status: Never   Smokeless tobacco: Never  Substance and Sexual Activity   Alcohol use: No    Alcohol/week: 0.0 standard drinks of alcohol   Drug use: No   Sexual activity: Not Currently    Partners: Male    Birth control/protection: Pill    No Known Allergies   Outpatient Medications Prior to Visit  Medication Sig Dispense Refill   ACCU-CHEK GUIDE test strip USE TO CHECK BLOOD SUGAR 3 TIMES DAILY     acetaminophen (TYLENOL) 325 MG tablet Take 2 tablets (650 mg total) by mouth every 4 (four) hours as needed for headache or mild pain.     cetirizine (ZYRTEC) 10 MG tablet Take 10 mg by mouth daily.     diclofenac Sodium (VOLTAREN) 1 % GEL Apply 2 g topically as needed.     folic acid (FOLVITE) 1 MG tablet Take 2 tablets (2 mg total) by mouth daily. 180 tablet 3   metFORMIN (GLUCOPHAGE) 500 MG tablet Take 250 mg by mouth daily with breakfast.     methotrexate (RHEUMATREX) 2.5 MG tablet Take 8 tablets (20 mg total) by mouth once a week. Start after completing your current bottle. Will provides refills at next appointment. 24  tablet 0   sulindac (CLINORIL) 150 MG tablet Take 150 mg by mouth 2 (two) times daily. Per patient taking daily as needed     No facility-administered medications prior to visit.    Review of Systems  Constitutional:  Negative for chills, diaphoresis, fever, malaise/fatigue and weight loss.  HENT:  Negative for congestion.   Respiratory:  Negative for cough, hemoptysis, sputum production, shortness of breath and wheezing.   Cardiovascular:  Negative for chest pain, palpitations and leg swelling.  Musculoskeletal:  Positive for joint pain.     Objective:   Vitals:   03/29/23 1158  BP: 102/60  Pulse: (!) 55   Temp: 98.8 F (37.1 C)  TempSrc: Oral  SpO2: 100%  Weight: 125 lb 9.6 oz (57 kg)  Height: 5\' 4"  (1.626 m)  SpO2: 100 % O2 Device: None (Room air)  Physical Exam: General: Well-appearing, no acute distress HENT: [place], AT Eyes: EOMI, no scleral icterus Respiratory: Clear to auscultation bilaterally.  No crackles, wheezing or rales Cardiovascular: RRR, -M/R/G, no JVD Extremities:-Edema,-tenderness Musculoskeletal: Active right shoulder abduction 120*, flexion 90* Neuro: AAO x4, CNII-XII grossly intact Psych: Normal mood, normal affect   Data Reviewed:  Imaging: MR Cardiac 08/22/22 - Concerning for cardiac sarcoid CT Chest 08/23/22 - No mediastinal or hilar adenopathy. Scattered subcentimeter pulmonary nodules with largest 6 mm in RUL CT Chest 01/16/23 - Minimal/mild scattered peribronchovascular nodularity. Stable nodules including RUL measuring 6 mm. Tiny bilateral pleural effusions.  PFT: 09/20/15 FVC 2.52 (107%) FEV1 2.18 (99%) Ratio 86   Interpretation: Normal spirometry  Labs:    Latest Ref Rng & Units 01/22/2023   10:48 AM 08/21/2022    1:20 PM 05/28/2022    2:42 PM  CBC  WBC 3.4 - 10.8 x10E3/uL 4.9  4.5  5.5   Hemoglobin 11.1 - 15.9 g/dL 16.1  09.6  04.5   Hematocrit 34.0 - 46.6 % 37.3  39.7  37.0   Platelets 150 - 450 x10E3/uL 278  328  280   Normal blood counts     Latest Ref Rng & Units 01/22/2023   10:48 AM 08/21/2022    1:20 PM 05/28/2022    2:42 PM  BMP  Glucose 70 - 99 mg/dL 91  92  98   BUN 6 - 24 mg/dL 17  14  12    Creatinine 0.57 - 1.00 mg/dL 4.09  8.11  9.14   BUN/Creat Ratio 9 - 23 16     Sodium 134 - 144 mmol/L 139  139  137   Potassium 3.5 - 5.2 mmol/L 4.5  4.2  3.7   Chloride 96 - 106 mmol/L 103  106  107   CO2 20 - 29 mmol/L 20  23  23    Calcium 8.7 - 10.2 mg/dL 9.8  9.7  9.2   Mild AKI     Latest Ref Rng & Units 01/22/2023   10:48 AM 10/02/2022    1:41 PM 01/10/2018    4:30 AM  Hepatic Function  Total Protein 6.0 - 8.5 g/dL 6.8  6.8   7.0   Albumin 3.9 - 4.9 g/dL 4.4  4.4  3.6   AST 0 - 40 IU/L 14  16  18    ALT 0 - 32 IU/L 11  13  12    Alk Phosphatase 44 - 121 IU/L 59  50  48   Total Bilirubin 0.0 - 1.2 mg/dL 0.9  1.5  0.6   Bilirubin, Direct 0.00 - 0.40 mg/dL  7.82  Normal LFTs        Assessment & Plan:   Discussion: 42 year old female with CHB s/p ICD 08/2022, DM2, GERD, migraines who presents for sarcoid follow-up. On immunosuppressants for active cardiac sarcoid.   We discussed the clinical course of sarcoid and management including serial PFTs, labs, eye exam, and EKG and chest imaging if indicated.   Reviewed risks and benefits of methotrexate therapy. Main adverse effects including but not limited to increased risk of infection, liver toxicity, bone marrow suppression.  Has photosensitivity with itching on methotrexate  Suspected cardiac sarcoidosis Complete heart block s/p ICD 08/2022 --Cardiac MRI 08/22/22 concerning for sarcoidosis --Prednisone 11/26/21 - self-discontinued  --CONTINUE methotrexate 15 mg weekly --Will plan for cardiac PET six months after initiation of methotrexate (Sept 2024). Will order at next visit  History of immunosuppression High risk medication management --Prednisone 08/23/22-current. Initially 40 mg --Prednisone 11/26/21 10 mg daily --Starting methotrexate 12/20/2022 >current --Decrease methotrexate 15 mg weekly and folic acid 2 mg daily --Labs ordered for monitoring below  Sarcoid Monitoring --Recent chest imaging reviewed.  --Annual PFTs.  Last PFTs 09/2015 --Annual ophthalmology exam.  Last visit on 2023 --Recent EKG and TTE reviewed. 08/22/22 Normal except for CHB --Routine labs as needed: CBC with diff, CMET  Subcentimeter pulmonary nodules with largest 6 mm in RUL - stable --CT Chest 01/16/23 reviewed with stable nodules  Insomnia due to steroids - resolved off steroids Right shoulder pain, limited ROM  Hx latent TB Treated x 6 months with health  department at 42 years old. Contracted from exposure in the [place] (reports Spanish speaking friend).  Consider referral to ID for potential need with TNF alpha in the future   Health Maintenance Immunization History  Administered Date(s) Administered   Influenza Whole 10/06/2008, 11/01/2009, 09/20/2010   Td 06/20/2010   CT Lung Screen - not qualified. Insufficient tobacco history  No orders of the defined types were placed in this encounter.  No orders of the defined types were placed in this encounter.  No follow-ups on file.  I have spent a total time of 38-minutes on the day of the appointment including chart review, data review, collecting history, coordinating care and discussing medical diagnosis and plan with the patient/family. Past medical history, allergies, medications were reviewed. Pertinent imaging, labs and tests included in this note have been reviewed and interpreted independently by me.  Akiah Bauch Mechele Collin, MD [facility] 03/29/2023 12:04 PM  Office Number [phone]

## 2023-03-29 NOTE — Patient Instructions (Addendum)
Suspected cardiac sarcoidosis Complete heart block s/p ICD 08/2022 History of immunosuppression --Decrease methotrexate to 15 mg weekly and folic acid 2 mg daily --Labs ordered for monitoring below --Will plan for cardiac PET six months after initiation of methotrexate (Sept 2024). Will order at next visit

## 2023-03-30 ENCOUNTER — Encounter (HOSPITAL_BASED_OUTPATIENT_CLINIC_OR_DEPARTMENT_OTHER): Payer: Self-pay | Admitting: Pulmonary Disease

## 2023-04-05 DIAGNOSIS — R634 Abnormal weight loss: Secondary | ICD-10-CM | POA: Diagnosis not present

## 2023-04-05 DIAGNOSIS — J301 Allergic rhinitis due to pollen: Secondary | ICD-10-CM | POA: Diagnosis not present

## 2023-04-05 DIAGNOSIS — E1165 Type 2 diabetes mellitus with hyperglycemia: Secondary | ICD-10-CM | POA: Diagnosis not present

## 2023-04-05 DIAGNOSIS — D72819 Decreased white blood cell count, unspecified: Secondary | ICD-10-CM | POA: Diagnosis not present

## 2023-04-05 DIAGNOSIS — E1142 Type 2 diabetes mellitus with diabetic polyneuropathy: Secondary | ICD-10-CM | POA: Diagnosis not present

## 2023-04-05 DIAGNOSIS — E782 Mixed hyperlipidemia: Secondary | ICD-10-CM | POA: Diagnosis not present

## 2023-04-05 DIAGNOSIS — G43019 Migraine without aura, intractable, without status migrainosus: Secondary | ICD-10-CM | POA: Diagnosis not present

## 2023-04-11 DIAGNOSIS — R634 Abnormal weight loss: Secondary | ICD-10-CM | POA: Diagnosis not present

## 2023-04-11 DIAGNOSIS — E1142 Type 2 diabetes mellitus with diabetic polyneuropathy: Secondary | ICD-10-CM | POA: Diagnosis not present

## 2023-04-11 DIAGNOSIS — E1165 Type 2 diabetes mellitus with hyperglycemia: Secondary | ICD-10-CM | POA: Diagnosis not present

## 2023-04-11 DIAGNOSIS — E782 Mixed hyperlipidemia: Secondary | ICD-10-CM | POA: Diagnosis not present

## 2023-04-11 DIAGNOSIS — G43019 Migraine without aura, intractable, without status migrainosus: Secondary | ICD-10-CM | POA: Diagnosis not present

## 2023-04-11 DIAGNOSIS — J301 Allergic rhinitis due to pollen: Secondary | ICD-10-CM | POA: Diagnosis not present

## 2023-04-11 DIAGNOSIS — D72819 Decreased white blood cell count, unspecified: Secondary | ICD-10-CM | POA: Diagnosis not present

## 2023-04-11 DIAGNOSIS — Z79899 Other long term (current) drug therapy: Secondary | ICD-10-CM | POA: Diagnosis not present

## 2023-04-11 DIAGNOSIS — D8685 Sarcoid myocarditis: Secondary | ICD-10-CM | POA: Diagnosis not present

## 2023-04-11 LAB — CBC WITH DIFFERENTIAL
Basophils Absolute: 0 10*3/uL (ref 0.0–0.2)
Basos: 1 %
EOS (ABSOLUTE): 0.1 10*3/uL (ref 0.0–0.4)
Eos: 1 %
Hematocrit: 37.1 % (ref 34.0–46.6)
Hemoglobin: 12.2 g/dL (ref 11.1–15.9)
Immature Grans (Abs): 0 10*3/uL (ref 0.0–0.1)
Immature Granulocytes: 0 %
Lymphocytes Absolute: 0.7 10*3/uL (ref 0.7–3.1)
Lymphs: 20 %
MCH: 28.6 pg (ref 26.6–33.0)
MCHC: 32.9 g/dL (ref 31.5–35.7)
MCV: 87 fL (ref 79–97)
Monocytes Absolute: 0.3 10*3/uL (ref 0.1–0.9)
Monocytes: 9 %
Neutrophils Absolute: 2.5 10*3/uL (ref 1.4–7.0)
Neutrophils: 69 %
RBC: 4.27 x10E6/uL (ref 3.77–5.28)
RDW: 13.5 % (ref 11.7–15.4)
WBC: 3.6 10*3/uL (ref 3.4–10.8)

## 2023-04-11 LAB — COMPREHENSIVE METABOLIC PANEL
ALT: 12 IU/L (ref 0–32)
AST: 12 IU/L (ref 0–40)
Albumin/Globulin Ratio: 1.8 (ref 1.2–2.2)
Albumin: 4.2 g/dL (ref 3.9–4.9)
Alkaline Phosphatase: 56 IU/L (ref 44–121)
BUN/Creatinine Ratio: 17 (ref 9–23)
BUN: 17 mg/dL (ref 6–24)
Bilirubin Total: 1.3 mg/dL — ABNORMAL HIGH (ref 0.0–1.2)
CO2: 22 mmol/L (ref 20–29)
Calcium: 9.4 mg/dL (ref 8.7–10.2)
Chloride: 107 mmol/L — ABNORMAL HIGH (ref 96–106)
Creatinine, Ser: 1.03 mg/dL — ABNORMAL HIGH (ref 0.57–1.00)
Globulin, Total: 2.3 g/dL (ref 1.5–4.5)
Glucose: 90 mg/dL (ref 70–99)
Potassium: 4.3 mmol/L (ref 3.5–5.2)
Sodium: 143 mmol/L (ref 134–144)
Total Protein: 6.5 g/dL (ref 6.0–8.5)
eGFR: 69 mL/min/{1.73_m2} (ref >59)

## 2023-04-22 ENCOUNTER — Telehealth (HOSPITAL_BASED_OUTPATIENT_CLINIC_OR_DEPARTMENT_OTHER): Payer: Self-pay | Admitting: Pulmonary Disease

## 2023-04-22 NOTE — Telephone Encounter (Signed)
[facility] Telephone Encounter  I contacted patient to update her on the 04/11/23 labs. Overall stable with mildly elevated Cr which is also stable.  While on the phone she reports ongoing skin sensitivity like a sunburn despite reducing methotrexate to 15 mg weekly.  Advised to reduce to methotrexate 10 mg weekly (4 pills). If she is not able to tolerate this, will need to restart prednisone and consider third line agent. Patient expressed understanding and agreed to plan.

## 2023-04-30 ENCOUNTER — Encounter: Payer: Self-pay | Admitting: Cardiology

## 2023-04-30 ENCOUNTER — Ambulatory Visit: Payer: Medicare HMO | Attending: Cardiology | Admitting: Cardiology

## 2023-04-30 VITALS — BP 122/62 | HR 77 | Ht 64.0 in | Wt 127.6 lb

## 2023-04-30 DIAGNOSIS — I442 Atrioventricular block, complete: Secondary | ICD-10-CM | POA: Diagnosis not present

## 2023-04-30 DIAGNOSIS — D869 Sarcoidosis, unspecified: Secondary | ICD-10-CM | POA: Diagnosis not present

## 2023-04-30 DIAGNOSIS — R079 Chest pain, unspecified: Secondary | ICD-10-CM | POA: Diagnosis not present

## 2023-04-30 NOTE — Progress Notes (Signed)
Cardiology Office Note:    Date:  04/30/2023   ID:  Vicki Mccoy, DOB Mar 24, 1981, MRN [id number]  PCP:  Vicki Salt, PA   Vicki Mccoy Providers Cardiologist:  None Electrophysiologist:  Vicki Small, MD     Referring MD: Vicki Salt, PA     History of Present Illness:    Vicki Mccoy is a 42 y.o. female here for evaluation of chest pain at the request of Vicki Mccoy, [place].  Was seen previously by Dr. Nelly Mccoy, EP, ICD placed defibrillator because of imaging concerning for cardiac sarcoid.  Previously woke up with coughing followed by chest pain tightness worse with deep inspiration back in early May.  Has occasional sharp pains right anterior chest.  Constant lasted all day.  No ICD shocks.  Past Medical History:  Diagnosis Date   Allergy    Arthritis    Cardiac sarcoidosis    Complete heart block (HCC)    Diabetes mellitus    Sickle cell trait (HCC)    URI 09/20/2010    Past Surgical History:  Procedure Laterality Date   ICD IMPLANT N/A 08/22/2022   Procedure: ICD IMPLANT;  Surgeon: Vicki Small, MD;  Location: [facility];  Service: Cardiovascular;  Laterality: N/A;    Current Medications: Current Meds  Medication Sig   ACCU-CHEK GUIDE test strip USE TO CHECK BLOOD SUGAR 3 TIMES DAILY   acetaminophen (TYLENOL) 325 MG tablet Take 2 tablets (650 mg total) by mouth every 4 (four) hours as needed for headache or mild pain.   cetirizine (ZYRTEC) 10 MG tablet Take 10 mg by mouth daily.   diclofenac Sodium (VOLTAREN) 1 % GEL Apply 2 g topically as needed.   folic acid (FOLVITE) 1 MG tablet Take 2 tablets (2 mg total) by mouth daily.   metFORMIN (GLUCOPHAGE) 500 MG tablet Take 250 mg by mouth daily with breakfast.   methotrexate (RHEUMATREX) 2.5 MG tablet Take 6 tablets (15 mg total) by mouth once a week. Start after completing your current bottle. Will provides refills at next appointment.   sulindac (CLINORIL) 150 MG tablet  Take 150 mg by mouth 2 (two) times daily. Per patient taking daily as needed     Allergies:   Patient has no known allergies.   Social History   Socioeconomic History   Marital status: Single    Spouse name: n/a   Number of children: 1   Years of education: 12   Highest education level: Not on file  Occupational History   Occupation: Technical sales engineer: UNEMPLOYED    Comment: housekeeping  Tobacco Use   Smoking status: Never   Smokeless tobacco: Never  Substance and Sexual Activity   Alcohol use: No    Alcohol/week: 0.0 standard drinks of alcohol   Drug use: No   Sexual activity: Not Currently    Partners: Male    Birth control/protection: Pill  Other Topics Concern   Not on file  Social History Narrative   Lives with her daughter.  Family is nearby.   Social Determinants of Health   Financial Resource Strain: Not on file  Food Insecurity: No Food Insecurity (08/22/2022)   Hunger Vital Sign    Worried About Running Out of Food in the Last Year: Never true    Ran Out of Food in the Last Year: Never true  Transportation Needs: No Transportation Needs (08/22/2022)   PRAPARE - Administrator, Civil Service (Medical):  No    Lack of Transportation (Non-Medical): No  Physical Activity: Not on file  Stress: Not on file  Social Connections: Not on file     Family History: The patient's family history includes Cerebral palsy in her daughter; Multiple sclerosis in her sister; Sickle cell anemia in her mother. There is no history of Allergic rhinitis, Angioedema, Asthma, Eczema, Urticaria, Immunodeficiency, or Breast cancer.  ROS:   Please see the history of present illness.     All other systems reviewed and are negative.  EKGs/Labs/Other Studies Reviewed:    The following studies were reviewed today:  cMRI 1.  Normal LV size with EF 63%, normal wall motion. 2.  Normal RV size with EF 63%. 3. Nodular mid-wall LGE in the mid septum and the mid anterior  wall (smaller). 4.  Nonspecific mild elevation in extracellular volume percentage. 5.  Borderline elevated T2 signal in the mid septum (54).   The appearance of LGE (nodular and circumscribed) is concerning for cardiac sarcoidosis.      Recent Labs: 08/21/2022: TSH 0.765 01/22/2023: Platelets 278 04/11/2023: ALT 12; BUN 17; Creatinine, Ser 1.03; Hemoglobin 12.2; Potassium 4.3; Sodium 143  Recent Lipid Panel No results found for: "CHOL", "TRIG", "HDL", "CHOLHDL", "VLDL", "LDLCALC", "LDLDIRECT"   Risk Assessment/Calculations:               Physical Exam:    VS:  BP 122/62   Pulse 77   Ht 5\' 4"  (1.626 m)   Wt 127 lb 9.6 oz (57.9 kg)   SpO2 99%   BMI 21.90 kg/m     Wt Readings from Last 3 Encounters:  04/30/23 127 lb 9.6 oz (57.9 kg)  03/29/23 125 lb 9.6 oz (57 kg)  03/06/23 126 lb 9.6 oz (57.4 kg)     GEN:  Well nourished, well developed in no acute distress HEENT: Normal NECK: No JVD; No carotid bruits LYMPHATICS: No lymphadenopathy CARDIAC: RRR, no murmurs, rubs, gallops RESPIRATORY:  Clear to auscultation without rales, wheezing or rhonchi  ABDOMEN: Soft, non-tender, non-distended MUSCULOSKELETAL:  No edema; No deformity  SKIN: Warm and dry NEUROLOGIC:  Alert and oriented x 3 PSYCHIATRIC:  Normal affect   ASSESSMENT:    1. Chest pain of uncertain etiology   2. Complete heart block (HCC)   3. Sarcoidosis    PLAN:    In order of problems listed above:  Chest discomfort - Previously had had some pleuritic chest pain with T wave inversion on the ECG laterally.  Echocardiogram performed on 03/06/2023 showed EF of 55% with normal function.  A Mccoy pericardial effusion was present.  Could this have been a bout of pericarditis.  Perhaps.  It would be unusual just to have this symptom for 1 day.  Overall she is feeling better.  No further chest pain.  Does not warrant any further testing.  Complete heart block and suspected sarcoid - Cardiac MRI as above.  ICD.   92% V paced.  Low normal pump function.  She did ask if she was going to have her ICD in place forever.  We will follow-up in 1 year.         Medication Adjustments/Labs and Tests Ordered: Current medicines are reviewed at length with the patient today.  Concerns regarding medicines are outlined above.  No orders of the defined types were placed in this encounter.  No orders of the defined types were placed in this encounter.   Patient Instructions  Medication Instructions:  Your physician recommends  that you continue on your current medications as directed. Please refer to the Current Medication list given to you today.  *If you need a refill on your cardiac medications before your next appointment, please call your pharmacy*   Follow-Up: At [facility], you and your health needs are our priority.  As part of our continuing mission to provide you with exceptional heart care, we have created designated Provider Care Teams.  These Care Teams include your primary Cardiologist (physician) and Advanced Practice Providers (APPs -  Physician Assistants and Nurse Practitioners) who all work together to provide you with the care you need, when you need it.  We recommend signing up for the patient portal called "MyChart".  Sign up information is provided on this After Visit Summary.  MyChart is used to connect with patients for Virtual Visits (Telemedicine).  Patients are able to view lab/test results, encounter notes, upcoming appointments, etc.  Non-urgent messages can be sent to your provider as well.   To learn more about what you can do with MyChart, go to [email or web address].    Your next appointment:   1 year(s)  Provider:   Donato Schultz, MD   Signed, Donato Schultz, MD  04/30/2023 2:22 PM    [place] [facility]

## 2023-04-30 NOTE — Patient Instructions (Signed)
Medication Instructions:   Your physician recommends that you continue on your current medications as directed. Please refer to the Current Medication list given to you today.  *If you need a refill on your cardiac medications before your next appointment, please call your pharmacy*    Follow-Up: At [facility], you and your health needs are our priority.  As part of our continuing mission to provide you with exceptional heart care, we have created designated Provider Care Teams.  These Care Teams include your primary Cardiologist (physician) and Advanced Practice Providers (APPs -  Physician Assistants and Nurse Practitioners) who all work together to provide you with the care you need, when you need it.  We recommend signing up for the patient portal called "MyChart".  Sign up information is provided on this After Visit Summary.  MyChart is used to connect with patients for Virtual Visits (Telemedicine).  Patients are able to view lab/test results, encounter notes, upcoming appointments, etc.  Non-urgent messages can be sent to your provider as well.   To learn more about what you can do with MyChart, go to [email or web address].    Your next appointment:   1 year(s)  Provider:   Mark Skains, MD      

## 2023-05-10 DIAGNOSIS — E1165 Type 2 diabetes mellitus with hyperglycemia: Secondary | ICD-10-CM | POA: Diagnosis not present

## 2023-05-10 DIAGNOSIS — G43019 Migraine without aura, intractable, without status migrainosus: Secondary | ICD-10-CM | POA: Diagnosis not present

## 2023-05-10 DIAGNOSIS — E782 Mixed hyperlipidemia: Secondary | ICD-10-CM | POA: Diagnosis not present

## 2023-05-10 DIAGNOSIS — J301 Allergic rhinitis due to pollen: Secondary | ICD-10-CM | POA: Diagnosis not present

## 2023-05-10 DIAGNOSIS — E1142 Type 2 diabetes mellitus with diabetic polyneuropathy: Secondary | ICD-10-CM | POA: Diagnosis not present

## 2023-05-24 ENCOUNTER — Ambulatory Visit (INDEPENDENT_AMBULATORY_CARE_PROVIDER_SITE_OTHER): Payer: Medicare HMO

## 2023-05-24 DIAGNOSIS — E119 Type 2 diabetes mellitus without complications: Secondary | ICD-10-CM | POA: Diagnosis not present

## 2023-05-24 DIAGNOSIS — I442 Atrioventricular block, complete: Secondary | ICD-10-CM

## 2023-05-24 DIAGNOSIS — H43823 Vitreomacular adhesion, bilateral: Secondary | ICD-10-CM | POA: Diagnosis not present

## 2023-05-24 DIAGNOSIS — H3553 Other dystrophies primarily involving the sensory retina: Secondary | ICD-10-CM | POA: Diagnosis not present

## 2023-05-26 LAB — CUP PACEART REMOTE DEVICE CHECK
Battery Remaining Longevity: 97 mo
Battery Remaining Percentage: 89 %
Battery Voltage: 3.01 V
Brady Statistic AP VP Percent: 3.9 %
Brady Statistic AP VS Percent: 12 %
Brady Statistic AS VP Percent: 42 %
Brady Statistic AS VS Percent: 42 %
Brady Statistic RA Percent Paced: 15 %
Brady Statistic RV Percent Paced: 46 %
Date Time Interrogation Session: 20240719015240
HighPow Impedance: 54 Ohm
Implantable Lead Connection Status: 753985
Implantable Lead Connection Status: 753985
Implantable Lead Implant Date: 20231018
Implantable Lead Implant Date: 20231018
Implantable Lead Location: 753859
Implantable Lead Location: 753860
Implantable Pulse Generator Implant Date: 20231018
Lead Channel Impedance Value: 330 Ohm
Lead Channel Impedance Value: 360 Ohm
Lead Channel Pacing Threshold Amplitude: 0.5 V
Lead Channel Pacing Threshold Amplitude: 0.875 V
Lead Channel Pacing Threshold Pulse Width: 0.5 ms
Lead Channel Pacing Threshold Pulse Width: 0.5 ms
Lead Channel Sensing Intrinsic Amplitude: 2 mV
Lead Channel Sensing Intrinsic Amplitude: 4.2 mV
Lead Channel Setting Pacing Amplitude: 1.125
Lead Channel Setting Pacing Amplitude: 1.5 V
Lead Channel Setting Pacing Pulse Width: 0.5 ms
Lead Channel Setting Sensing Sensitivity: 0.5 mV
Pulse Gen Serial Number: 211001606
Zone Setting Status: 755011

## 2023-06-06 NOTE — Progress Notes (Signed)
Remote ICD transmission.   

## 2023-06-11 DIAGNOSIS — E119 Type 2 diabetes mellitus without complications: Secondary | ICD-10-CM | POA: Diagnosis not present

## 2023-06-11 DIAGNOSIS — Z0100 Encounter for examination of eyes and vision without abnormal findings: Secondary | ICD-10-CM | POA: Diagnosis not present

## 2023-06-11 DIAGNOSIS — E1159 Type 2 diabetes mellitus with other circulatory complications: Secondary | ICD-10-CM | POA: Diagnosis not present

## 2023-06-11 DIAGNOSIS — H538 Other visual disturbances: Secondary | ICD-10-CM | POA: Diagnosis not present

## 2023-07-03 ENCOUNTER — Ambulatory Visit (INDEPENDENT_AMBULATORY_CARE_PROVIDER_SITE_OTHER): Payer: Medicare HMO | Admitting: Pulmonary Disease

## 2023-07-03 ENCOUNTER — Other Ambulatory Visit: Payer: Self-pay | Admitting: Pulmonary Disease

## 2023-07-03 ENCOUNTER — Encounter (HOSPITAL_BASED_OUTPATIENT_CLINIC_OR_DEPARTMENT_OTHER): Payer: Self-pay | Admitting: Pulmonary Disease

## 2023-07-03 VITALS — BP 102/60 | HR 84 | Resp 16 | Ht 64.0 in | Wt 124.4 lb

## 2023-07-03 DIAGNOSIS — D8685 Sarcoid myocarditis: Secondary | ICD-10-CM

## 2023-07-03 DIAGNOSIS — Z79899 Other long term (current) drug therapy: Secondary | ICD-10-CM

## 2023-07-03 NOTE — Progress Notes (Signed)
Subjective:   PATIENT ID: Vicki Mccoy, Vicki Mccoy  Chief Complaint  Patient presents with   Follow-up    Been doing ok. No issues.     Reason for Visit: Follow-up  Vicki Mccoy is a 42 year old female with CHB s/p ICD 08/2022, DM2, GERD, migraines who presents for follow-up for sarcoid  Synopsis: She was admitted in October 2023 for syncope and diagnosed with CHB s/p ICD placement. Cardiac MRI demonstrated concerns for sarcoidosis She was started prednisone taper 40 mg x 1 month followed by 30 mg daily. Tolerating well. No reports of fever, fatigue, weight loss, visual disturbance, dyspnea, cough, palpitations, abdominal pain, numbness, tingling, lightheadedness, syncope   11/26/22 She is currently on prednisone 20 mg daily. Tolerating well. Denies shortness of breath, wheezing or cough. Denies chest pain. Denies limitation in activity. Daughter has CP and she is the main caregiver. Has some occasional shoulder pain. Denies fatigue, weight loss, night sweats  01/22/23 Started on [facility] 12/20/22. No current GI issues. Did have stomach pain after doing 15 day GI cleanse while on this medication. Otherwise feels she is tolerating well. Chief complaint comments on occasional breathing issues relieved with rest however on interview patient denied respiratory symptoms. Denied shortness of breath, cough or wheezing. Her right shoulder pain has worsened and has more limited range of motion.  03/29/23 Since our last visit she tolerating methotrexate except for skin itching with sun exposure. Benadryl has not helped. Denies and abdominal pain, nausea, oral ulcers or recent infections. Her prior shoulder pain has improved. Seen by Ortho.  07/03/23 Since our last visit she is doing well on methotrexate 15 mg weekly. Did not decrease to 10. No oral ulcers, nausea or abdominal pain. Denies fatigue, chest pain, shortness of breath or cough.  Social  History: Never smoker Daughter with cerebral palsy  Past Medical History:  Diagnosis Date   Allergy    Arthritis    Cardiac sarcoidosis    Complete heart block (HCC)    Diabetes mellitus    Sickle cell trait (HCC)    URI 09/20/2010     Family History  Problem Relation Age of Onset   Sickle cell anemia Mother    Multiple sclerosis Sister    Cerebral palsy Daughter    Allergic rhinitis Neg Hx    Angioedema Neg Hx    Asthma Neg Hx    Eczema Neg Hx    Urticaria Neg Hx    Immunodeficiency Neg Hx    Breast cancer Neg Hx      Social History   Occupational History   Occupation: Technical sales engineer: UNEMPLOYED    Comment: housekeeping  Tobacco Use   Smoking status: Never   Smokeless tobacco: Never  Substance and Sexual Activity   Alcohol use: No    Alcohol/week: 0.0 standard drinks of alcohol   Drug use: No   Sexual activity: Not Currently    Partners: Male    Birth control/protection: Pill    No Known Allergies   Outpatient Medications Prior to Visit  Medication Sig Dispense Refill   ACCU-CHEK GUIDE test strip USE TO CHECK BLOOD SUGAR 3 TIMES DAILY     acetaminophen (TYLENOL) 325 MG tablet Take 2 tablets (650 mg total) by mouth every 4 (four) hours as needed for headache or mild pain.     cetirizine (ZYRTEC) 10 MG tablet Take 10 mg by mouth daily.     diclofenac Sodium (  VOLTAREN) 1 % GEL Apply 2 g topically as needed.     folic acid (FOLVITE) 1 MG tablet Take 2 tablets (2 mg total) by mouth daily. 180 tablet 3   methotrexate (RHEUMATREX) 2.5 MG tablet Take 6 tablets (15 mg total) by mouth once a week. Start after completing your current bottle. Will provides refills at next appointment. 72 tablet 0   sulindac (CLINORIL) 150 MG tablet Take 150 mg by mouth 2 (two) times daily. Per patient taking daily as needed     metFORMIN (GLUCOPHAGE) 500 MG tablet Take 250 mg by mouth daily with breakfast.     No facility-administered medications prior to visit.    Review of  Systems  Constitutional:  Negative for chills, diaphoresis, fever, malaise/fatigue and weight loss.  HENT:  Negative for congestion.   Respiratory:  Negative for cough, hemoptysis, sputum production, shortness of breath and wheezing.   Cardiovascular:  Negative for chest pain, palpitations and leg swelling.     Objective:   Vitals:   07/03/23 1045  BP: 102/60  Pulse: 84  Resp: 16  SpO2: 97%  Weight: 124 lb 6.4 oz (56.4 kg)  Height: 5\' 4"  (1.626 m)   SpO2: 97 %  Physical Exam: General: Well-appearing, no acute distress HENT: [place], AT Eyes: EOMI, no scleral icterus Respiratory: Clear to auscultation bilaterally.  No crackles, wheezing or rales Cardiovascular: RRR, -M/R/G, no JVD Extremities:-Edema,-tenderness Neuro: AAO x4, CNII-XII grossly intact Psych: Normal mood, normal affect   Data Reviewed:  Imaging: MR Cardiac 08/22/22 - Concerning for cardiac sarcoid CT Chest 08/23/22 - No mediastinal or hilar adenopathy. Scattered subcentimeter pulmonary nodules with largest 6 mm in RUL CT Chest 01/16/23 - Minimal/mild scattered peribronchovascular nodularity. Stable nodules including RUL measuring 6 mm. Tiny bilateral pleural effusions.  PFT: 09/20/15 FVC 2.52 (107%) FEV1 2.18 (99%) Ratio 86   Interpretation: Normal spirometry  Labs:    Latest Ref Rng & Units 04/11/2023   11:31 AM 01/22/2023   10:48 AM 08/21/2022    1:20 PM  CBC  WBC 3.4 - 10.8 x10E3/uL 3.6  4.9  4.5   Hemoglobin 11.1 - 15.9 g/dL 40.9  81.1  91.4   Hematocrit 34.0 - 46.6 % 37.1  37.3  39.7   Platelets 150 - 450 x10E3/uL  278  328   Normal blood counts     Latest Ref Rng & Units 04/11/2023   11:31 AM 01/22/2023   10:48 AM 08/21/2022    1:20 PM  BMP  Glucose 70 - 99 mg/dL 90  91  92   BUN 6 - 24 mg/dL 17  17  14    Creatinine 0.57 - 1.00 mg/dL 7.82  9.56  2.13   BUN/Creat Ratio 9 - 23 17  16     Sodium 134 - 144 mmol/L 143  139  139   Potassium 3.5 - 5.2 mmol/L 4.3  4.5  4.2   Chloride 96 - 106 mmol/L  107  103  106   CO2 20 - 29 mmol/L 22  20  23    Calcium 8.7 - 10.2 mg/dL 9.4  9.8  9.7   Mild AKI     Latest Ref Rng & Units 04/11/2023   11:31 AM 01/22/2023   10:48 AM 10/02/2022    1:41 PM  Hepatic Function  Total Protein 6.0 - 8.5 g/dL 6.5  6.8  6.8   Albumin 3.9 - 4.9 g/dL 4.2  4.4  4.4   AST 0 - 40 IU/L 12  14  16   ALT 0 - 32 IU/L 12  11  13    Alk Phosphatase 44 - 121 IU/L 56  59  50   Total Bilirubin 0.0 - 1.2 mg/dL 1.3  0.9  1.5   Bilirubin, Direct 0.00 - 0.40 mg/dL   0.98   Normal LFTs        Assessment & Plan:   Discussion: 42 year old female with CHB s/p ICD 08/2022, DM2, GERD, migraines who presents for sarcoid follow-up. On immunosuppressants for active cardiac sarcoid.  We discussed the clinical course of sarcoid and management including serial PFTs, labs, eye exam, and EKG and chest imaging if indicated.   Tolerating well on reduced dose of methotrexate due to photosensitivity/itching on methotrexate.  Cardiac sarcoidosis Complete heart block s/p ICD 08/2022 --Cardiac MRI 08/22/22 concerning for sarcoidosis --Prednisone 11/26/21 - self-discontinued  --CONTINUE methotrexate 15 mg weekly --Will plan for cardiac PET six months after initiation of methotrexate (end of November 2024). Will order today  History of immunosuppression High risk medication management --Prednisone 08/23/22-current. Initially 40 mg --Prednisone 1/22/2-2/12/24 10 mg daily --Starting methotrexate 12/20/2022  --03/29/23 Reduced methotrexate to 15 mg weekly d/t skin sensitivity --Continue methotrexate 15 mg weekly and folic acid 2 mg daily --Labs ordered for monitoring below  Sarcoid Monitoring --Recent chest imaging reviewed.  --Annual PFTs.  Last PFTs 09/2015 --Annual ophthalmology exam.  Last visit on 2023 --Recent EKG and TTE reviewed. 08/22/22 Normal except for CHB --Routine labs as needed: CBC with diff, CMET  Subcentimeter pulmonary nodules with largest 6 mm in RUL -  stable --CT Chest 01/16/23 reviewed with stable nodules  Hx latent TB Treated x 6 months with health department at 42 years old. Contracted from exposure in the [place] (reports Spanish speaking friend).  Consider referral to ID for potential need with TNF alpha in the future   Health Maintenance Immunization History  Administered Date(s) Administered   Influenza Whole 10/06/2008, 11/01/2009, 09/20/2010   Td 06/20/2010   CT Lung Screen - not qualified. Insufficient tobacco history  Orders Placed This Encounter  Procedures   NM PET CT MYOCARDIAL SARCOIDOSIS    Standing Status:   Future    Standing Expiration Date:   07/02/2024    Scheduling Instructions:     Schedule last week of November 2024    Order Specific Question:   If indicated for the ordered procedure, I authorize the administration of a radiopharmaceutical per Radiology protocol    Answer:   Yes    Order Specific Question:   Is the patient pregnant?    Answer:   No    Order Specific Question:   Preferred Imaging Location    Answer:   [facility]   CBC With Differential    Standing Status:   Future    Standing Expiration Date:   07/02/2024   Comp Met (CMET)    Standing Status:   Future    Standing Expiration Date:   07/02/2024    Order Specific Question:   Has the patient fasted?    Answer:   No   No orders of the defined types were placed in this encounter.  Return for First week of December.  I have spent a total time of 40-minutes on the day of the appointment including chart review, data review, collecting history, coordinating care and discussing medical diagnosis and plan with the patient/family. Past medical history, allergies, medications were reviewed. Pertinent imaging, labs and tests included in this note have been reviewed  and interpreted independently by me.  Sacheen Arrasmith Mechele Collin, MD [facility] 07/03/2023 11:04 AM  Office Number [phone]

## 2023-07-03 NOTE — Patient Instructions (Addendum)
Cardiac sarcoidosis --CONTINUE methotrexate 15 mg weekly --CONTINUE folic acid 2 mg daily --Will plan for cardiac PET six months after initiation of methotrexate (end of November 2024). Will order today

## 2023-07-04 LAB — CBC WITH DIFFERENTIAL/PLATELET
Basophils Absolute: 0 10*3/uL (ref 0.0–0.2)
Basos: 0 %
EOS (ABSOLUTE): 0.1 10*3/uL (ref 0.0–0.4)
Eos: 3 %
Hematocrit: 37.7 % (ref 34.0–46.6)
Hemoglobin: 12.8 g/dL (ref 11.1–15.9)
Immature Grans (Abs): 0 10*3/uL (ref 0.0–0.1)
Immature Granulocytes: 0 %
Lymphocytes Absolute: 0.6 10*3/uL — ABNORMAL LOW (ref 0.7–3.1)
Lymphs: 12 %
MCH: 28.1 pg (ref 26.6–33.0)
MCHC: 34 g/dL (ref 31.5–35.7)
MCV: 83 fL (ref 79–97)
Monocytes Absolute: 0.4 10*3/uL (ref 0.1–0.9)
Monocytes: 8 %
Neutrophils Absolute: 3.9 10*3/uL (ref 1.4–7.0)
Neutrophils: 77 %
Platelets: 268 10*3/uL (ref 150–450)
RBC: 4.55 x10E6/uL (ref 3.77–5.28)
RDW: 13.7 % (ref 11.7–15.4)
WBC: 5.1 10*3/uL (ref 3.4–10.8)

## 2023-07-04 LAB — COMPREHENSIVE METABOLIC PANEL
ALT: 11 IU/L (ref 0–32)
AST: 11 IU/L (ref 0–40)
Albumin: 4.2 g/dL (ref 3.9–4.9)
Alkaline Phosphatase: 70 IU/L (ref 44–121)
BUN/Creatinine Ratio: 20 (ref 9–23)
BUN: 19 mg/dL (ref 6–24)
Bilirubin Total: 0.8 mg/dL (ref 0.0–1.2)
CO2: 22 mmol/L (ref 20–29)
Calcium: 9.5 mg/dL (ref 8.7–10.2)
Chloride: 104 mmol/L (ref 96–106)
Creatinine, Ser: 0.94 mg/dL (ref 0.57–1.00)
Globulin, Total: 2.5 g/dL (ref 1.5–4.5)
Glucose: 83 mg/dL (ref 70–99)
Potassium: 4.6 mmol/L (ref 3.5–5.2)
Sodium: 140 mmol/L (ref 134–144)
Total Protein: 6.7 g/dL (ref 6.0–8.5)
eGFR: 77 mL/min/{1.73_m2} (ref >59)

## 2023-07-15 ENCOUNTER — Other Ambulatory Visit (HOSPITAL_BASED_OUTPATIENT_CLINIC_OR_DEPARTMENT_OTHER): Payer: Self-pay | Admitting: Pulmonary Disease

## 2023-07-15 DIAGNOSIS — D8685 Sarcoid myocarditis: Secondary | ICD-10-CM

## 2023-07-15 DIAGNOSIS — Z79899 Other long term (current) drug therapy: Secondary | ICD-10-CM

## 2023-08-07 DIAGNOSIS — M1611 Unilateral primary osteoarthritis, right hip: Secondary | ICD-10-CM | POA: Diagnosis not present

## 2023-08-07 DIAGNOSIS — Z23 Encounter for immunization: Secondary | ICD-10-CM | POA: Diagnosis not present

## 2023-08-07 DIAGNOSIS — J301 Allergic rhinitis due to pollen: Secondary | ICD-10-CM | POA: Diagnosis not present

## 2023-08-07 DIAGNOSIS — E1142 Type 2 diabetes mellitus with diabetic polyneuropathy: Secondary | ICD-10-CM | POA: Diagnosis not present

## 2023-08-07 DIAGNOSIS — Z0001 Encounter for general adult medical examination with abnormal findings: Secondary | ICD-10-CM | POA: Diagnosis not present

## 2023-08-07 DIAGNOSIS — G43E09 Chronic migraine with aura, not intractable, without status migrainosus: Secondary | ICD-10-CM | POA: Diagnosis not present

## 2023-08-07 DIAGNOSIS — E782 Mixed hyperlipidemia: Secondary | ICD-10-CM | POA: Diagnosis not present

## 2023-08-13 DIAGNOSIS — M1612 Unilateral primary osteoarthritis, left hip: Secondary | ICD-10-CM | POA: Diagnosis not present

## 2023-08-13 DIAGNOSIS — M7062 Trochanteric bursitis, left hip: Secondary | ICD-10-CM | POA: Diagnosis not present

## 2023-08-13 DIAGNOSIS — M247 Protrusio acetabuli: Secondary | ICD-10-CM | POA: Diagnosis not present

## 2023-08-23 ENCOUNTER — Ambulatory Visit (INDEPENDENT_AMBULATORY_CARE_PROVIDER_SITE_OTHER): Payer: Medicare HMO

## 2023-08-23 DIAGNOSIS — I442 Atrioventricular block, complete: Secondary | ICD-10-CM | POA: Diagnosis not present

## 2023-08-23 LAB — CUP PACEART REMOTE DEVICE CHECK
Battery Remaining Longevity: 95 mo
Battery Remaining Percentage: 87 %
Battery Voltage: 2.99 V
Brady Statistic AP VP Percent: 1.9 %
Brady Statistic AP VS Percent: 11 %
Brady Statistic AS VP Percent: 23 %
Brady Statistic AS VS Percent: 64 %
Brady Statistic RA Percent Paced: 12 %
Brady Statistic RV Percent Paced: 25 %
Date Time Interrogation Session: 20241017220202
HighPow Impedance: 59 Ohm
Implantable Lead Connection Status: 753985
Implantable Lead Connection Status: 753985
Implantable Lead Implant Date: 20231018
Implantable Lead Implant Date: 20231018
Implantable Lead Location: 753859
Implantable Lead Location: 753860
Implantable Pulse Generator Implant Date: 20231018
Lead Channel Impedance Value: 310 Ohm
Lead Channel Impedance Value: 350 Ohm
Lead Channel Pacing Threshold Amplitude: 0.5 V
Lead Channel Pacing Threshold Amplitude: 0.875 V
Lead Channel Pacing Threshold Pulse Width: 0.5 ms
Lead Channel Pacing Threshold Pulse Width: 0.5 ms
Lead Channel Sensing Intrinsic Amplitude: 2.5 mV
Lead Channel Sensing Intrinsic Amplitude: 3.7 mV
Lead Channel Setting Pacing Amplitude: 1.125
Lead Channel Setting Pacing Amplitude: 1.5 V
Lead Channel Setting Pacing Pulse Width: 0.5 ms
Lead Channel Setting Sensing Sensitivity: 0.5 mV
Pulse Gen Serial Number: 211001606
Zone Setting Status: 755011

## 2023-08-27 NOTE — Progress Notes (Unsigned)
NEUROLOGY FOLLOW UP OFFICE NOTE  DOCIA MAGNIFICO [phone]  Assessment/Plan:   Menstrual migraine, stable   Migraine prevention:  Deferred.  Doing well Migraine rescue:  Will have her try Zavzpret.  Otherwise, she has the [place] 100mg  if needed.  Due to recent heart block, would like to avoid triptans.   Limit use of pain relievers to no more than 2 days out of week to prevent risk of rebound or medication-overuse headache. Keep headache diary Follow up 6 months .       Subjective:  Vicki Mccoy is a 43 year old female with sarcoidosis with cardiac involvement (heart block s/p ICD), DM 2 with polyneuropathy, arthritis and Sickle cell trait who follows up for migraines.   UPDATE: Only one migraine since last visit. Intensity:  10/10 Duration:  30 minutes with Vicki Mccoy but returns after 6-7 hours for 3 consecutive days Frequency:  1 to 2 in last 6 months   Sometimes her legs jerk in her sleep and it wakes her up.  It doesn't occur frequently.      Current NSAIDS/analgesics:  Tylenol Current triptans: Would avoid due to heart block Current ergotamine:  none Current anti-emetic:  none Current muscle relaxants:  none Current Antihypertensive medications:  none Current Antidepressant medications:  none Current Anticonvulsant medications: none Current anti-CGRP:  Ubrelvy 100mg  Current Vitamins/Herbal/Supplements:  D Current Antihistamines/Decongestants:  none Other therapy:  none     Caffeine:  coffee occasionally.  Occasional soda Alcohol:  no Smoker:  no Diet:  5-6 bottles water daily.  Rarely soda.   Exercise:  walks Depression:  no; Anxiety:  no Other pain:  no Sleep hygiene:  good   HISTORY:  Onset:  Menstrual migraines as a teenager  They subsequently resolved but returned about 2 months ago.   Location:  left temple radiating into eye Quality:  throbbing Intensity:  10/10 Aura:  absent Prodrome:  absent Associated symptoms:  Photophobia, sometimes  blurred vision.  She denies associated nausea, vomiting, phonophobia, osmophobia, autonomic symptoms, unilateral numbness or weakness. Duration:  3-4 days.  Aborts quickly with [facility] but pain will returns 8-9 hours later Frequency:  Occurs with her cycle.  Starts one day before menses Triggers:  Menses Relieving factors:  BC powder Activity:  aggravates     Past NSAIDS/analgesics:  naproxen, ibuprofen, Tylenol, Excedrin igraine Past abortive triptans:  sumatriptan tab, zolmitriptan (dizziness) Past abortive ergotamine:  none Past muscle relaxants:  cyclobenzaprine Past anti-emetic:  none Past antihypertensive medications:  furosemide Past antidepressant medications:  none Past anticonvulsant medications:  topiramate (blurred vision, fatigue), gabapentin Past anti-CGRP:  none Past vitamins/Herbal/Supplements:  none Past antihistamines/decongestants:  meclizine, Zyrtec, Flonase Other past therapies:  none   PAST MEDICAL HISTORY: Past Medical History:  Diagnosis Date   Allergy    Arthritis    Cardiac sarcoidosis    Complete heart block (HCC)    Diabetes mellitus    Sickle cell trait (HCC)    URI 09/20/2010    MEDICATIONS: Current Outpatient Medications on File Prior to Visit  Medication Sig Dispense Refill   ACCU-CHEK GUIDE test strip USE TO CHECK BLOOD SUGAR 3 TIMES DAILY     acetaminophen (TYLENOL) 325 MG tablet Take 2 tablets (650 mg total) by mouth every 4 (four) hours as needed for headache or mild pain.     cetirizine (ZYRTEC) 10 MG tablet Take 10 mg by mouth daily.     diclofenac Sodium (VOLTAREN) 1 % GEL Apply 2 g topically as needed.  folic acid (FOLVITE) 1 MG tablet Take 2 tablets (2 mg total) by mouth daily. 180 tablet 3   methotrexate (RHEUMATREX) 2.5 MG tablet TAKE 6 TABLETS (15 MG TOTAL) BY MOUTH ONCE A WEEK. START AFTER COMPLETING YOUR CURRENT BOTTLE. WILL PROVIDES REFILLS AT NEXT APPOINTMENT. 72 tablet 0   sulindac (CLINORIL) 150 MG tablet Take 150 mg by mouth  2 (two) times daily. Per patient taking daily as needed     No current facility-administered medications on file prior to visit.    ALLERGIES: No Known Allergies  FAMILY HISTORY: Family History  Problem Relation Age of Onset   Sickle cell anemia Mother    Multiple sclerosis Sister    Cerebral palsy Daughter    Allergic rhinitis Neg Hx    Angioedema Neg Hx    Asthma Neg Hx    Eczema Neg Hx    Urticaria Neg Hx    Immunodeficiency Neg Hx    Breast cancer Neg Hx       Objective:  Blood pressure 120/80, pulse 96, height 5\' 3"  (1.6 m), weight 124 lb (56.2 kg), SpO2 98%. General: No acute distress.  Patient appears well-groomed.   Head:  Normocephalic/atraumatic Neck:  Supple.  No paraspinal tenderness.  Full range of motion. Heart:  Regular rate and rhythm. Neuro:  Alert and oriented.  Speech fluent and not dysarthric.  Language intact.  CN II-XII intact.  Bulk and tone normal.  Muscle strength 5/5 throughout.  Deep tendon reflexes 2+ throughout.  Gait normal.  Romberg negative.    Shon Millet, DO  CC: Vicki Riffle, PA

## 2023-08-28 ENCOUNTER — Encounter: Payer: Self-pay | Admitting: Neurology

## 2023-08-28 ENCOUNTER — Ambulatory Visit (INDEPENDENT_AMBULATORY_CARE_PROVIDER_SITE_OTHER): Payer: Medicare HMO | Admitting: Neurology

## 2023-08-28 VITALS — BP 120/80 | HR 96 | Ht 63.0 in | Wt 124.0 lb

## 2023-08-28 DIAGNOSIS — G4761 Periodic limb movement disorder: Secondary | ICD-10-CM

## 2023-08-28 DIAGNOSIS — G43829 Menstrual migraine, not intractable, without status migrainosus: Secondary | ICD-10-CM | POA: Diagnosis not present

## 2023-08-28 NOTE — Patient Instructions (Signed)
Try using Zavzpret nasal spray - blow your nose first.  One spray in one nostril in 24 hours.  Otherwise, if ineffective, continue using Ubrelvy or other the counter pain reliever

## 2023-09-04 NOTE — Progress Notes (Signed)
Remote ICD transmission.   

## 2023-09-10 DIAGNOSIS — M1612 Unilateral primary osteoarthritis, left hip: Secondary | ICD-10-CM | POA: Diagnosis not present

## 2023-09-10 DIAGNOSIS — M7062 Trochanteric bursitis, left hip: Secondary | ICD-10-CM | POA: Diagnosis not present

## 2023-09-26 ENCOUNTER — Encounter (HOSPITAL_COMMUNITY): Payer: Self-pay

## 2023-09-30 ENCOUNTER — Telehealth (HOSPITAL_COMMUNITY): Payer: Self-pay | Admitting: *Deleted

## 2023-09-30 NOTE — Telephone Encounter (Signed)
Reaching out to patient to offer assistance regarding upcoming cardiac imaging study; pt verbalizes understanding of appt date/time, parking situation and where to check in, pre-test NPO status and verified current allergies; name and call back number provided for further questions should they arise  Larey Brick RN Navigator Cardiac Imaging [facility] [phone] office [phone] cell  Patient verbalized understanding of diet prep.

## 2023-10-02 ENCOUNTER — Encounter (HOSPITAL_COMMUNITY)
Admission: RE | Admit: 2023-10-02 | Discharge: 2023-10-02 | Disposition: A | Discharge status: Home / Self Care | Payer: Medicare HMO | Source: Ambulatory Visit | Attending: Pulmonary Disease | Admitting: Pulmonary Disease

## 2023-10-02 DIAGNOSIS — D8685 Sarcoid myocarditis: Secondary | ICD-10-CM | POA: Diagnosis not present

## 2023-10-02 LAB — NM PET CT MYOCARDIAL SARCOIDOSIS
LV dias vol: 76 mL (ref 46–106)
LV sys vol: 31 mL
Nuc Stress EF: 59 %
Rest Nuclear Isotope Dose: 14.8 mCi

## 2023-10-02 MED ORDER — FLUDEOXYGLUCOSE F - 18 (FDG) INJECTION
8.8000 | Freq: Once | INTRAVENOUS | Status: AC
  Administered 2023-10-02: 8.8 via INTRAVENOUS

## 2023-10-02 MED ORDER — RUBIDIUM RB82 GENERATOR (RUBYFILL)
14.8400 | PACK | Freq: Once | INTRAVENOUS | Status: AC
  Administered 2023-10-02: 14.84 via INTRAVENOUS

## 2023-10-07 ENCOUNTER — Other Ambulatory Visit: Payer: Self-pay | Admitting: Pulmonary Disease

## 2023-10-07 ENCOUNTER — Other Ambulatory Visit: Payer: Self-pay | Admitting: Pharmacist

## 2023-10-07 ENCOUNTER — Encounter: Payer: Self-pay | Admitting: Pulmonary Disease

## 2023-10-07 ENCOUNTER — Encounter (HOSPITAL_BASED_OUTPATIENT_CLINIC_OR_DEPARTMENT_OTHER): Payer: Self-pay | Admitting: Pulmonary Disease

## 2023-10-07 ENCOUNTER — Ambulatory Visit (HOSPITAL_BASED_OUTPATIENT_CLINIC_OR_DEPARTMENT_OTHER): Payer: Medicare HMO | Admitting: Pulmonary Disease

## 2023-10-07 VITALS — BP 102/60 | HR 97 | Ht 63.0 in | Wt 123.4 lb

## 2023-10-07 DIAGNOSIS — D869 Sarcoidosis, unspecified: Secondary | ICD-10-CM

## 2023-10-07 DIAGNOSIS — R918 Other nonspecific abnormal finding of lung field: Secondary | ICD-10-CM

## 2023-10-07 DIAGNOSIS — D8685 Sarcoid myocarditis: Secondary | ICD-10-CM | POA: Diagnosis not present

## 2023-10-07 DIAGNOSIS — Z79899 Other long term (current) drug therapy: Secondary | ICD-10-CM | POA: Diagnosis not present

## 2023-10-07 MED ORDER — PREDNISONE 10 MG PO TABS: ORAL_TABLET | ORAL | 0 refills | Status: AC

## 2023-10-07 MED ORDER — METHOTREXATE SODIUM 2.5 MG PO TABS: 15.0000 mg | ORAL_TABLET | ORAL | 2 refills | Status: DC

## 2023-10-07 NOTE — Patient Instructions (Addendum)
Cardiac Sarcoidosis --Reviewed cardiac PET 10/02/23 with consistent active myocardial sarcoid. Pulmonary nodules are stable --Continue methotrexate 15 mg weekly and folic acid 2 mg daily --RESTART prednisone taper for 20 mg x 4 weeks, 10 mg x 4 weeks and 5 mg x 4 weeks. Please take over the counter reflux medication with this (omeprazole 20 mg nightly or pepcid nightly) when you are taking the higher dose of prednisone --Will work on enrolling for Infliximab  --Labs ordered for monitoring below

## 2023-10-07 NOTE — Progress Notes (Signed)
Therapy plan placed for Remicade [id number]) for [facility] to start benefits investigation  Diagnosis: sarcoidosis Continue methotrexate 15mg  once weekly with folic acid 2mg  daily  Provider: Dr. Luciano Cutter  Dose: 3 mg/kg at Week 0, 2, 6 then every 8 weeks   Last Clinic Visit: 10/07/2023 Next Clinic Visit: 12/25/2023  Pertinent Labs: history of being treated for latent TB; will need chest xray completely routinely every 1-2 years for ongoing monitoring confirming that TB is not reactivated; Hepatitis panel on 10/02/2022  CBC and CMP on 07/03/23  ATC patient to discuss. Unable to reach. Left VM with my callback number  Vicki Mccoy, PharmD, MPH, BCPS, CPP Clinical Pharmacist (Rheumatology and Pulmonology)

## 2023-10-07 NOTE — Progress Notes (Signed)
Subjective:   PATIENT ID: Elvera Maria GENDER: female DOB: 08-31-1981, MRN: [id number]  Chief Complaint  Patient presents with   Follow-up    Follow up sarcoidosis , pt has no new concerns     Reason for Visit: Follow-up  Ms. Daelyn Becher is a 42 year old female with CHB s/p ICD 08/2022, DM2, GERD, migraines who presents for follow-up for sarcoid  Synopsis: She was admitted in October 2023 for syncope and diagnosed with CHB s/p ICD placement. Cardiac MRI demonstrated concerns for sarcoidosis She was started prednisone taper 40 mg x 1 month followed by 30 mg daily. Tolerating well. No reports of fever, fatigue, weight loss, visual disturbance, dyspnea, cough, palpitations, abdominal pain, numbness, tingling, lightheadedness, syncope   11/26/22 She is currently on prednisone 20 mg daily. Tolerating well. Denies shortness of breath, wheezing or cough. Denies chest pain. Denies limitation in activity. Daughter has CP and she is the main caregiver. Has some occasional shoulder pain. Denies fatigue, weight loss, night sweats  01/22/23 Started on [facility] 12/20/22. No current GI issues. Did have stomach pain after doing 15 day GI cleanse while on this medication. Otherwise feels she is tolerating well. Chief complaint comments on occasional breathing issues relieved with rest however on interview patient denied respiratory symptoms. Denied shortness of breath, cough or wheezing. Her right shoulder pain has worsened and has more limited range of motion.  03/29/23 Since our last visit she tolerating methotrexate except for skin itching with sun exposure. Benadryl has not helped. Denies and abdominal pain, nausea, oral ulcers or recent infections. Her prior shoulder pain has improved. Seen by Ortho.  07/03/23 Since our last visit she is doing well on methotrexate 15 mg weekly. Did not decrease to 10. No oral ulcers, nausea or abdominal pain. Denies fatigue, chest pain, shortness of breath or  cough.  10/07/23 Since our last visit she is on methotrexate 15 mg weekly and folic acid daily. Tolerating methotrexate well. No GI upset or oral ulcers. No significant illness since our last visit.  Social History: Never smoker Daughter with cerebral palsy  Past Medical History:  Diagnosis Date   Allergy    Arthritis    Cardiac sarcoidosis    Complete heart block (HCC)    Diabetes mellitus    Sickle cell trait (HCC)    URI 09/20/2010     Family History  Problem Relation Age of Onset   Sickle cell anemia Mother    Multiple sclerosis Sister    Cerebral palsy Daughter    Allergic rhinitis Neg Hx    Angioedema Neg Hx    Asthma Neg Hx    Eczema Neg Hx    Urticaria Neg Hx    Immunodeficiency Neg Hx    Breast cancer Neg Hx      Social History   Occupational History   Occupation: Technical sales engineer: UNEMPLOYED    Comment: housekeeping  Tobacco Use   Smoking status: Never   Smokeless tobacco: Never  Vaping Use   Vaping status: Never Used  Substance and Sexual Activity   Alcohol use: No    Alcohol/week: 0.0 standard drinks of alcohol   Drug use: No   Sexual activity: Not Currently    Partners: Male    Birth control/protection: Pill    No Known Allergies   Outpatient Medications Prior to Visit  Medication Sig Dispense Refill   ACCU-CHEK GUIDE test strip USE TO CHECK BLOOD SUGAR 3 TIMES DAILY  acetaminophen (TYLENOL) 325 MG tablet Take 2 tablets (650 mg total) by mouth every 4 (four) hours as needed for headache or mild pain.     cetirizine (ZYRTEC) 10 MG tablet Take 10 mg by mouth daily.     diclofenac Sodium (VOLTAREN) 1 % GEL Apply 2 g topically as needed.     folic acid (FOLVITE) 1 MG tablet Take 2 tablets (2 mg total) by mouth daily. 180 tablet 3   methotrexate (RHEUMATREX) 2.5 MG tablet TAKE 6 TABLETS (15 MG TOTAL) BY MOUTH ONCE A WEEK. START AFTER COMPLETING YOUR CURRENT BOTTLE. WILL PROVIDES REFILLS AT NEXT APPOINTMENT. 72 tablet 0   No  facility-administered medications prior to visit.    Review of Systems  Constitutional:  Negative for chills, diaphoresis, fever, malaise/fatigue and weight loss.  HENT:  Negative for congestion.   Respiratory:  Negative for cough, hemoptysis, sputum production, shortness of breath and wheezing.   Cardiovascular:  Negative for chest pain, palpitations and leg swelling.     Objective:   Vitals:   10/07/23 0951  BP: 102/60  Pulse: 97  SpO2: 97%  Weight: 123 lb 6.4 oz (56 kg)  Height: 5\' 3"  (1.6 m)   SpO2: 97 %  Physical Exam: General: Well-appearing, no acute distress HENT: [place], AT Eyes: EOMI, no scleral icterus Respiratory: Clear to auscultation bilaterally.  No crackles, wheezing or rales Cardiovascular: RRR, -M/R/G, no JVD Extremities:-Edema,-tenderness Neuro: AAO x4, CNII-XII grossly intact Psych: Normal mood, normal affect  Data Reviewed:  Imaging: MR Cardiac 08/22/22 - Concerning for cardiac sarcoid CT Chest 08/23/22 - No mediastinal or hilar adenopathy. Scattered subcentimeter pulmonary nodules with largest 6 mm in RUL CT Chest 01/16/23 - Minimal/mild scattered peribronchovascular nodularity. Stable nodules including RUL measuring 6 mm. Tiny bilateral pleural effusions.  PET/CT 10/02/23 -   FDG uptake findings are consistent with active myocardial inflammation/sarcoidosis in multiple segments. There appears to be a small area of fixed reduction in perfusion in the basal septum, probably due to postinflammatory scar.   FDG uptake was observed. FDG uptake was focal. FDG uptake was intense in the mid to basal septal segment(s). There is abnormal perfusion in the basal part of this area. There is also intense FDG uptake in the mid-apical anterior wall, with normal perfusion in that area. A smaller area of mild FDG uptake is seen in the basal lateral left ventricular wall. LV perfusion is abnormal. There is no evidence of ischemia. Defect 1: There is a small defect with  moderate reduction in uptake present in the basal septal location(s). FDG uptake was present in the described defect segment(s). There is abnormal wall motion in the defect area.   Left ventricular function is normal. EF: 59%. End diastolic cavity size is normal. End systolic cavity size is normal. - Stable pulmonary findings  PFT: 09/20/15 FVC 2.52 (107%) FEV1 2.18 (99%) Ratio 86   Interpretation: Normal spirometry  Labs:    Latest Ref Rng & Units 07/03/2023   11:32 AM 04/11/2023   11:31 AM 01/22/2023   10:48 AM  CBC  WBC 3.4 - 10.8 x10E3/uL 5.1  3.6  4.9   Hemoglobin 11.1 - 15.9 g/dL 60.4  54.0  98.1   Hematocrit 34.0 - 46.6 % 37.7  37.1  37.3   Platelets 150 - 450 x10E3/uL 268   278   Normal blood counts     Latest Ref Rng & Units 07/03/2023   11:32 AM 04/11/2023   11:31 AM 01/22/2023   10:48 AM  BMP  Glucose 70 - 99 mg/dL 83  90  91   BUN 6 - 24 mg/dL 19  17  17    Creatinine 0.57 - 1.00 mg/dL 1.32  4.40  1.02   BUN/Creat Ratio 9 - 23 20  17  16    Sodium 134 - 144 mmol/L 140  143  139   Potassium 3.5 - 5.2 mmol/L 4.6  4.3  4.5   Chloride 96 - 106 mmol/L 104  107  103   CO2 20 - 29 mmol/L 22  22  20    Calcium 8.7 - 10.2 mg/dL 9.5  9.4  9.8   Mild AKI     Latest Ref Rng & Units 07/03/2023   11:32 AM 04/11/2023   11:31 AM 01/22/2023   10:48 AM  Hepatic Function  Total Protein 6.0 - 8.5 g/dL 6.7  6.5  6.8   Albumin 3.9 - 4.9 g/dL 4.2  4.2  4.4   AST 0 - 40 IU/L 11  12  14    ALT 0 - 32 IU/L 11  12  11    Alk Phosphatase 44 - 121 IU/L 70  56  59   Total Bilirubin 0.0 - 1.2 mg/dL 0.8  1.3  0.9   Normal LFTs        Assessment & Plan:   Discussion: 42 year old female with CHB s/p ICD 08/2022, DM2, GERD, migraines who presents for sarcoid follow-up. On immunosuppressants for active cardiac sarcoid.  We discussed the clinical course of sarcoid and management including serial PFTs, labs, eye exam, and EKG and chest imaging if indicated. Uncontrolled on methotrexate.  Immunosuppression plan discussed below. Discussed adverse effects of prednisone, methotrexate and infliximab. Addressed patients questions/concerns. Agrees to pursue addition of infliximab if this is an option. Will treat current flare with prednisone and methotrexate in the interim  Cardiac sarcoidosis Complete heart block s/p ICD 08/2022 --Cardiac MRI 08/22/22 concerning for sarcoidosis --Prednisone 11/26/21 - self-discontinued  --CONTINUE methotrexate 15 mg weekly --Reviewed cardiac PET 10/02/23 with consistent active myocardial sarcoid. Pulmonary nodules are stable --Will need to start prednisone and consider starting infliximab pending status with latent TB  History of immunosuppression High risk medication management --Prednisone 08/23/22-current. Initially 40 mg --Prednisone 1/22/2-2/12/24 10 mg daily --Starting methotrexate 12/20/2022  --03/29/23 Reduced methotrexate to 15 mg weekly d/t skin sensitivity --Continue methotrexate 15 mg weekly and folic acid 2 mg daily --RESTART prednisone taper for 20 mg x 4 weeks, 10 mg x 4 weeks and 5 mg x 4 weeks. Please take over the counter reflux medication with this (omeprazole 20 mg nightly or pepcid nightly) when you are taking the higher dose of prednisone --Will work on enrolling for Infliximab --Labs ordered for monitoring below  Sarcoid Monitoring --Recent chest imaging reviewed.  --Annual PFTs.  Last PFTs 09/2015 --Annual ophthalmology exam.  Last visit on 2023 --Recent EKG and TTE reviewed. 08/22/22 Normal except for CHB --Routine labs as needed: CBC with diff, CMET  Subcentimeter pulmonary nodules with largest 6 mm in RUL - stable --CT Chest 01/16/23 reviewed with stable nodules  Hx latent TB Treated x 6 months with health department at 42 years old. Contracted from exposure in the [place] (reports Spanish speaking friend).  Consider referral to ID for potential need with TNF alpha in the future   Health Maintenance Immunization  History  Administered Date(s) Administered   Influenza Whole 10/06/2008, 11/01/2009, 09/20/2010   Td 06/20/2010   CT Lung Screen - not qualified. Insufficient tobacco history  Orders Placed This Encounter  Procedures   CBC with Differential   Comp Met (CMET)    Standing Status:   Future    Standing Expiration Date:   10/06/2024   Meds ordered this encounter  Medications   predniSONE (DELTASONE) 10 MG tablet    Sig: Take 2 tablets (20 mg total) by mouth daily with breakfast for 28 days, THEN 1 tablet (10 mg total) daily with breakfast for 28 days, THEN 0.5 tablets (5 mg total) daily with breakfast for 28 days.    Dispense:  98 tablet    Refill:  0   methotrexate (RHEUMATREX) 2.5 MG tablet    Sig: Take 6 tablets (15 mg total) by mouth once a week. Start after completing your current bottle. Will provides refills at next appointment.    Dispense:  72 tablet    Refill:  2   Return in about 2 months (around 12/08/2023).  I have spent a total time of 42-minutes on the day of the appointment including chart review, data review, collecting history, coordinating care and discussing medical diagnosis and plan with the patient/family. Past medical history, allergies, medications were reviewed. Pertinent imaging, labs and tests included in this note have been reviewed and interpreted independently by me.  Linsay Vogt Mechele Collin, MD [facility] 10/07/2023 10:23 AM  Office Number [phone]

## 2023-10-08 ENCOUNTER — Telehealth: Payer: Self-pay

## 2023-10-08 LAB — COMPREHENSIVE METABOLIC PANEL
ALT: 14 [IU]/L (ref 0–32)
AST: 14 [IU]/L (ref 0–40)
Albumin: 4.2 g/dL (ref 3.9–4.9)
Alkaline Phosphatase: 64 [IU]/L (ref 44–121)
BUN/Creatinine Ratio: 17 (ref 9–23)
BUN: 16 mg/dL (ref 6–24)
Bilirubin Total: 0.6 mg/dL (ref 0.0–1.2)
CO2: 26 mmol/L (ref 20–29)
Calcium: 9.3 mg/dL (ref 8.7–10.2)
Chloride: 106 mmol/L (ref 96–106)
Creatinine, Ser: 0.96 mg/dL (ref 0.57–1.00)
Globulin, Total: 2.5 g/dL (ref 1.5–4.5)
Glucose: 83 mg/dL (ref 70–99)
Potassium: 4.4 mmol/L (ref 3.5–5.2)
Sodium: 142 mmol/L (ref 134–144)
Total Protein: 6.7 g/dL (ref 6.0–8.5)
eGFR: 75 mL/min/{1.73_m2} (ref >59)

## 2023-10-08 LAB — CBC WITH DIFFERENTIAL/PLATELET
Basophils Absolute: 0 10*3/uL (ref 0.0–0.2)
Basos: 1 %
EOS (ABSOLUTE): 0.1 10*3/uL (ref 0.0–0.4)
Eos: 2 %
Hematocrit: 39.5 % (ref 34.0–46.6)
Hemoglobin: 12.7 g/dL (ref 11.1–15.9)
Immature Grans (Abs): 0 10*3/uL (ref 0.0–0.1)
Immature Granulocytes: 0 %
Lymphocytes Absolute: 0.6 10*3/uL — ABNORMAL LOW (ref 0.7–3.1)
Lymphs: 15 %
MCH: 28.4 pg (ref 26.6–33.0)
MCHC: 32.2 g/dL (ref 31.5–35.7)
MCV: 88 fL (ref 79–97)
Monocytes Absolute: 0.3 10*3/uL (ref 0.1–0.9)
Monocytes: 8 %
Neutrophils Absolute: 2.8 10*3/uL (ref 1.4–7.0)
Neutrophils: 74 %
Platelets: 273 10*3/uL (ref 150–450)
RBC: 4.47 x10E6/uL (ref 3.77–5.28)
RDW: 14 % (ref 11.7–15.4)
WBC: 3.8 10*3/uL (ref 3.4–10.8)

## 2023-10-08 LAB — SPECIMEN STATUS REPORT

## 2023-10-08 NOTE — Telephone Encounter (Signed)
Dr. Everardo All and [facility], patient will be scheduled as soon as possible.  Auth Submission: APPROVED Site of care: Site of care: [facility] Payer: [employer] Medication & CPT/J Code(s) submitted: Avsola (infliximab-axxq) [phone] Route of submission (phone, fax, portal): portal Phone # Fax # Auth type: Buy/Bill PB Units/visits requested: 3mg /kg every 2 weeks x 2 doses, every 4 weeks x 1 dose, then every 8 weeks thereafter Reference number: [id number] Approval from: 10/07/23 to 11/04/24

## 2023-10-14 NOTE — Progress Notes (Signed)
Assessment/Plan:  Counseled patient that infliximab is a TNF blocking agent.  Counseled patient on purpose, proper use, and adverse effects of infliximab.  Reviewed the most common adverse effects including infections, infusion reactions, headache, and injection site reactions. Discussed that there is the possibility of an increased risk of malignancy including non-melanoma skin cancer but it is not well understood if this increased risk is due to the medication or the disease state.  Advised patient to get yearly dermatology exams due to risk of skin cancer. Counseled patient that infliximab should be held prior to scheduled surgery.  Counseled patient to avoid live vaccines while on infliximab.  Recommend annual influenza, PCV 15 or PCV20 or Pneumovax 23, and Shingrix as indicated.  Reviewed the importance of regular labs while on infliximab therapy. Will monitor CBC and CMP 1 month after starting and then every 3 months routinely thereafter. Will monitor TB gold annually (yearly chest xrays).  All questions encouraged and answered. Patient verbalized understanding.   Dose: 3 mg/kg at Week 0, 2, 6 then every 8 weeks   Vicki Mccoy, PharmD, MPH, BCPS, CPP Clinical Pharmacist (Rheumatology and Pulmonology)

## 2023-10-14 NOTE — Progress Notes (Signed)
ATC patient regarding infliximab. Unable to reach. Left VM and advised her that she will need to continue methotrexate weekly with infliximab infsuion. Provided my direct callback number for any questions  Chesley Mires, PharmD, MPH, BCPS, CPP Clinical Pharmacist (Rheumatology and Pulmonology)

## 2023-10-18 DIAGNOSIS — E1165 Type 2 diabetes mellitus with hyperglycemia: Secondary | ICD-10-CM | POA: Diagnosis not present

## 2023-10-18 DIAGNOSIS — J301 Allergic rhinitis due to pollen: Secondary | ICD-10-CM | POA: Diagnosis not present

## 2023-10-18 DIAGNOSIS — H9313 Tinnitus, bilateral: Secondary | ICD-10-CM | POA: Diagnosis not present

## 2023-10-18 DIAGNOSIS — E1142 Type 2 diabetes mellitus with diabetic polyneuropathy: Secondary | ICD-10-CM | POA: Diagnosis not present

## 2023-10-18 DIAGNOSIS — M1611 Unilateral primary osteoarthritis, right hip: Secondary | ICD-10-CM | POA: Diagnosis not present

## 2023-10-18 DIAGNOSIS — G43019 Migraine without aura, intractable, without status migrainosus: Secondary | ICD-10-CM | POA: Diagnosis not present

## 2023-10-18 DIAGNOSIS — E782 Mixed hyperlipidemia: Secondary | ICD-10-CM | POA: Diagnosis not present

## 2023-11-05 ENCOUNTER — Ambulatory Visit: Payer: Medicare HMO

## 2023-11-18 ENCOUNTER — Encounter: Payer: Medicare HMO | Admitting: Pulmonary Disease

## 2023-11-18 MED ORDER — ACETAMINOPHEN 325 MG PO TABS: 650.0000 mg | ORAL_TABLET | Freq: Once | ORAL | Status: DC

## 2023-11-18 MED ORDER — DIPHENHYDRAMINE HCL 25 MG PO CAPS: 25.0000 mg | ORAL_CAPSULE | Freq: Once | ORAL | Status: DC

## 2023-11-18 NOTE — Progress Notes (Signed)
Pt rescheduled due to weather

## 2023-11-20 ENCOUNTER — Encounter: Payer: Self-pay | Admitting: Pulmonary Disease

## 2023-11-21 ENCOUNTER — Ambulatory Visit (INDEPENDENT_AMBULATORY_CARE_PROVIDER_SITE_OTHER): Payer: Medicare HMO

## 2023-11-21 VITALS — BP 103/66 | HR 67 | Temp 98.0°F | Resp 20 | Ht 64.0 in | Wt 126.4 lb

## 2023-11-21 DIAGNOSIS — D869 Sarcoidosis, unspecified: Secondary | ICD-10-CM | POA: Diagnosis not present

## 2023-11-21 DIAGNOSIS — Z79899 Other long term (current) drug therapy: Secondary | ICD-10-CM | POA: Diagnosis not present

## 2023-11-21 MED ORDER — SODIUM CHLORIDE 0.9 % IV SOLN
3.0000 mg/kg | Freq: Once | INTRAVENOUS | Status: AC
  Administered 2023-11-21: 200 mg via INTRAVENOUS
  Filled 2023-11-21: qty 20

## 2023-11-21 MED ORDER — ACETAMINOPHEN 325 MG PO TABS
650.0000 mg | ORAL_TABLET | Freq: Once | ORAL | Status: AC
  Administered 2023-11-21: 650 mg via ORAL
  Filled 2023-11-21: qty 2

## 2023-11-21 MED ORDER — DIPHENHYDRAMINE HCL 25 MG PO CAPS
25.0000 mg | ORAL_CAPSULE | Freq: Once | ORAL | Status: AC
  Administered 2023-11-21: 25 mg via ORAL
  Filled 2023-11-21: qty 1

## 2023-11-21 NOTE — Progress Notes (Signed)
Diagnosis: Sarcoidosis  Provider:  Chilton Greathouse MD  Procedure: IV Infusion  IV Type: Peripheral, IV Location: R Forearm  Avsola (infliximab-axxq), Dose: 200 mg  Infusion Start Time: 1140  Infusion Stop Time: 1352  Post Infusion IV Care: Observation period completed and Peripheral IV Discontinued  Discharge: Condition: Good, Destination: Home . AVS Declined  Performed by:  Adriana Mccallum, RN

## 2023-11-21 NOTE — Patient Instructions (Signed)
 Infliximab Injection What is this medication? INFLIXIMAB (in FLIX i mab) treats autoimmune conditions, such as psoriasis, arthritis, Crohn's disease, and ulcerative colitis. It works by slowing down an overactive immune system. It belongs to a group of medications called TNF inhibitors. It is a monoclonal antibody. This medicine may be used for other purposes; ask your health care provider or pharmacist if you have questions. COMMON BRAND NAME(S): AVSOLA, INFLECTRA, IXIFI, Remicade, RENFLEXIS, Zymfentra What should I tell my care team before I take this medication? They need to know if you have any of these conditions: Cancer Current or past resident of [place] or [place] Diabetes Exposure to tuberculosis Guillain-Barre syndrome Heart failure Liver disease Immune system problems Infection Lung or breathing disease, such as COPD Multiple sclerosis Receiving phototherapy for the skin Seizure disorder An unusual or allergic reaction to infliximab, mouse proteins, other medications, foods, dyes, or preservatives Pregnant or trying to get pregnant Breast-feeding How should I use this medication? This medication is injected into a vein or under the skin. If it is injected into a vein, it is given by your care team in a hospital or clinic setting. If it is injected under the skin, it may be given at home. If you get this medication at home, you will be taught how to prepare and give it. Use exactly as directed. Take it as directed on the prescription label at the same time every day. Keep taking it unless your care team tells you to stop. It is important that you put your used needles and syringes in a special sharps container. Do not put them in a trash can. If you do not have a sharps container, call your pharmacist or care team to get one. A special MedGuide will be given to you by the pharmacist with each prescription and refill. Be sure to read this information carefully each  time. Talk to your care team about the use of this medication in children. While it may be prescribed for children as young as 33 years of age for selected conditions, precautions do apply. Overdosage: If you think you have taken too much of this medicine contact a poison control center or emergency room at once. NOTE: This medicine is only for you. Do not share this medicine with others. What if I miss a dose? If you get this medication at the hospital or clinic: It is important not to miss your dose. Call your care team if you are unable to keep an appointment. If you give yourself this medication at home: If you miss a dose, take it as soon as you can. Then give the next dose 2 weeks later and continue your normal schedule. If it is almost time for your next dose, take only that dose. Do not take double or extra doses. Call your care team with questions. What may interact with this medication? Do not take this medication with any of the following: Biologic medications, such as abatacept, adalimumab, anakinra, certolizumab, etanercept, golimumab, rituximab, secukinumab, tocilizumab, tofactinib, ustekinumab Live vaccines This list may not describe all possible interactions. Give your health care provider a list of all the medicines, herbs, non-prescription drugs, or dietary supplements you use. Also tell them if you smoke, drink alcohol, or use illegal drugs. Some items may interact with your medicine. What should I watch for while using this medication? Visit your care team for regular checks on your progress. Your condition will be monitored carefully while you are receiving this medication. You may  need blood work done while you are taking this medication. You will be tested for tuberculosis (TB) before you start this medication. If your care team prescribes any medication for TB, you should start taking the TB medication before starting this medication. Make sure to finish the full course of TB  medication. This medication may increase your risk of getting an infection. Call your care team for advice if you get a fever, chills, sore throat, or other symptoms of a cold or flu. Do not treat yourself. Try to avoid being around people who are sick. This medication may make the symptoms of heart failure worse in some patients. Contact your care team right away if you develop signs or symptoms of heart failure. Before having surgery or dental work, talk to your care team to make sure it is ok. This medication can increase the risk of poor healing of your surgical site or wound. If you take this medication for plaque psoriasis, stay out of the sun. If you cannot avoid being in the sun, wear protective clothing and sunscreen. Do not use sun lamps or tanning beds/booths. Talk to your care team about your risk of cancer. You may be more at risk for certain types of cancers if you take this medication. What side effects may I notice from receiving this medication? Side effects that you should report to your care team as soon as possible: Allergic reactions--skin rash, itching, hives, swelling of the face, lips, tongue, or throat Body pain, tingling, or numbness Heart attack--pain or tightness in the chest, shoulders, arms, or jaw, nausea, shortness of breath, cold or clammy skin, feeling faint or lightheaded Heart failure--shortness of breath, swelling of the ankles, feet, or hands, sudden weight gain, unusual weakness or fatigue Heart rhythm changes--fast or irregular heartbeat, dizziness, feeling faint or lightheaded, chest pain, trouble breathing Increase in blood pressure Infection--fever, chills, cough, sore throat, wounds that don't heal, pain or trouble when passing urine, general feeling of discomfort or being unwell Liver injury--right upper belly pain, loss of appetite, nausea, light-colored stool, dark yellow or brown urine, yellowing skin or eyes, unusual weakness or fatigue Low blood  pressure--dizziness, feeling faint or lightheaded, blurry vision Lupus-like syndrome--joint pain, swelling, or stiffness, butterfly-shaped rash on the face, rashes that get worse in the sun, fever, unusual weakness or fatigue Seizures Stroke--sudden numbness or weakness of the face, arm, or leg, trouble speaking, confusion, trouble walking, loss of balance or coordination, dizziness, severe headache, change in vision Sudden vision loss in one or both eyes Unusual bruising or bleeding Side effects that usually do not require medical attention (report to your care team if they continue or are bothersome): Cough Diarrhea Fatigue Headache Nausea Runny or stuffy nose Sore throat Stomach pain This list may not describe all possible side effects. Call your doctor for medical advice about side effects. You may report side effects to FDA at 1-800-FDA-1088. Where should I keep my medication? Keep out of the reach of children and pets. See product for storage information. Each product may have different instructions. Get rid of any unused medication after the expiration date. To get rid of medications that are no longer needed or have expired: Take the medication to a medication take-back program. Check with your pharmacy or law enforcement to find a location. If you cannot return the medication, ask your pharmacist or care team how to get rid of this medication safely. NOTE: This sheet is a summary. It may not cover all possible information. If  you have questions about this medicine, talk to your doctor, pharmacist, or health care provider.  2024 Elsevier/Gold Standard (2022-10-17 00:00:00)

## 2023-11-22 ENCOUNTER — Ambulatory Visit: Payer: Medicare HMO

## 2023-11-22 DIAGNOSIS — I442 Atrioventricular block, complete: Secondary | ICD-10-CM | POA: Diagnosis not present

## 2023-11-22 LAB — CUP PACEART REMOTE DEVICE CHECK
Battery Remaining Longevity: 94 mo
Battery Remaining Percentage: 85 %
Battery Voltage: 2.99 V
Brady Statistic AP VP Percent: 1.3 %
Brady Statistic AP VS Percent: 11 %
Brady Statistic AS VP Percent: 17 %
Brady Statistic AS VS Percent: 71 %
Brady Statistic RA Percent Paced: 12 %
Brady Statistic RV Percent Paced: 18 %
Date Time Interrogation Session: 20250116210311
HighPow Impedance: 57 Ohm
Implantable Lead Connection Status: 753985
Implantable Lead Connection Status: 753985
Implantable Lead Implant Date: 20231018
Implantable Lead Implant Date: 20231018
Implantable Lead Location: 753859
Implantable Lead Location: 753860
Implantable Pulse Generator Implant Date: 20231018
Lead Channel Impedance Value: 390 Ohm
Lead Channel Impedance Value: 390 Ohm
Lead Channel Pacing Threshold Amplitude: 0.5 V
Lead Channel Pacing Threshold Amplitude: 1.125 V
Lead Channel Pacing Threshold Pulse Width: 0.5 ms
Lead Channel Pacing Threshold Pulse Width: 0.5 ms
Lead Channel Sensing Intrinsic Amplitude: 1.5 mV
Lead Channel Sensing Intrinsic Amplitude: 8.6 mV
Lead Channel Setting Pacing Amplitude: 1.375
Lead Channel Setting Pacing Amplitude: 1.5 V
Lead Channel Setting Pacing Pulse Width: 0.5 ms
Lead Channel Setting Sensing Sensitivity: 0.5 mV
Pulse Gen Serial Number: 211001606
Zone Setting Status: 755011

## 2023-11-29 DIAGNOSIS — J309 Allergic rhinitis, unspecified: Secondary | ICD-10-CM | POA: Diagnosis not present

## 2023-11-29 DIAGNOSIS — R051 Acute cough: Secondary | ICD-10-CM | POA: Diagnosis not present

## 2023-11-30 ENCOUNTER — Encounter: Payer: Self-pay | Admitting: Cardiovascular Disease

## 2023-12-05 ENCOUNTER — Other Ambulatory Visit: Payer: Self-pay

## 2023-12-05 ENCOUNTER — Ambulatory Visit: Payer: Medicare HMO

## 2023-12-05 ENCOUNTER — Telehealth (INDEPENDENT_AMBULATORY_CARE_PROVIDER_SITE_OTHER): Payer: Self-pay | Admitting: Otolaryngology

## 2023-12-05 VITALS — BP 114/63 | HR 50 | Temp 97.9°F | Resp 18 | Ht 64.0 in | Wt 124.0 lb

## 2023-12-05 DIAGNOSIS — D869 Sarcoidosis, unspecified: Secondary | ICD-10-CM | POA: Diagnosis not present

## 2023-12-05 DIAGNOSIS — Z79899 Other long term (current) drug therapy: Secondary | ICD-10-CM

## 2023-12-05 MED ORDER — ACETAMINOPHEN 325 MG PO TABS
650.0000 mg | ORAL_TABLET | Freq: Once | ORAL | Status: AC
  Administered 2023-12-05: 650 mg via ORAL
  Filled 2023-12-05: qty 2

## 2023-12-05 MED ORDER — DIPHENHYDRAMINE HCL 25 MG PO CAPS
25.0000 mg | ORAL_CAPSULE | Freq: Once | ORAL | Status: AC
  Administered 2023-12-05: 25 mg via ORAL
  Filled 2023-12-05: qty 1

## 2023-12-05 MED ORDER — SODIUM CHLORIDE 0.9 % IV SOLN
3.0000 mg/kg | Freq: Once | INTRAVENOUS | Status: AC
  Administered 2023-12-05: 200 mg via INTRAVENOUS
  Filled 2023-12-05: qty 20

## 2023-12-05 NOTE — Progress Notes (Signed)
Diagnosis: Sarcoidosis   Provider:  Chilton Greathouse MD  Procedure: IV Infusion  IV Type: Peripheral, IV Location: R Antecubital  Avsola (infliximab-axxq), Dose: 200 mg  Infusion Start Time: 1051  Infusion Stop Time: 1310  Post Infusion IV Care: Peripheral IV Discontinued  Discharge: Condition: Good, Destination: Home . AVS Declined  Performed by:  Rico Ala, LPN

## 2023-12-05 NOTE — Telephone Encounter (Signed)
Called and left vm to confirm appt and address for 12/06/2023.

## 2023-12-06 ENCOUNTER — Encounter (HOSPITAL_BASED_OUTPATIENT_CLINIC_OR_DEPARTMENT_OTHER): Payer: Self-pay | Admitting: Pulmonary Disease

## 2023-12-06 ENCOUNTER — Encounter (INDEPENDENT_AMBULATORY_CARE_PROVIDER_SITE_OTHER): Payer: Self-pay

## 2023-12-06 ENCOUNTER — Ambulatory Visit (INDEPENDENT_AMBULATORY_CARE_PROVIDER_SITE_OTHER): Payer: Medicare HMO | Admitting: Audiology

## 2023-12-06 ENCOUNTER — Ambulatory Visit (INDEPENDENT_AMBULATORY_CARE_PROVIDER_SITE_OTHER): Payer: Medicare HMO | Admitting: Otolaryngology

## 2023-12-06 VITALS — BP 116/76 | HR 72 | Ht 64.0 in | Wt 125.0 lb

## 2023-12-06 DIAGNOSIS — H9313 Tinnitus, bilateral: Secondary | ICD-10-CM

## 2023-12-06 DIAGNOSIS — H903 Sensorineural hearing loss, bilateral: Secondary | ICD-10-CM

## 2023-12-06 LAB — CBC WITH DIFFERENTIAL/PLATELET
Absolute Lymphocytes: 722 {cells}/uL — ABNORMAL LOW (ref 850–3900)
Absolute Monocytes: 320 {cells}/uL (ref 200–950)
Basophils Absolute: 31 {cells}/uL (ref 0–200)
Basophils Relative: 0.8 %
Eosinophils Absolute: 101 {cells}/uL (ref 15–500)
Eosinophils Relative: 2.6 %
HCT: 36.5 % (ref 35.0–45.0)
Hemoglobin: 12.2 g/dL (ref 11.7–15.5)
MCH: 29.3 pg (ref 27.0–33.0)
MCHC: 33.4 g/dL (ref 32.0–36.0)
MCV: 87.5 fL (ref 80.0–100.0)
MPV: 10.1 fL (ref 7.5–12.5)
Monocytes Relative: 8.2 %
Neutro Abs: 2726 {cells}/uL (ref 1500–7800)
Neutrophils Relative %: 69.9 %
Platelets: 275 10*3/uL (ref 140–400)
RBC: 4.17 10*6/uL (ref 3.80–5.10)
RDW: 14.6 % (ref 11.0–15.0)
Total Lymphocyte: 18.5 %
WBC: 3.9 10*3/uL (ref 3.8–10.8)

## 2023-12-06 LAB — COMPREHENSIVE METABOLIC PANEL
AG Ratio: 2.1 (calc) (ref 1.0–2.5)
ALT: 14 U/L (ref 6–29)
AST: 13 U/L (ref 10–30)
Albumin: 4.4 g/dL (ref 3.6–5.1)
Alkaline phosphatase (APISO): 47 U/L (ref 31–125)
BUN: 16 mg/dL (ref 7–25)
CO2: 27 mmol/L (ref 20–32)
Calcium: 9.4 mg/dL (ref 8.6–10.2)
Chloride: 107 mmol/L (ref 98–110)
Creat: 0.96 mg/dL (ref 0.50–0.99)
Globulin: 2.1 g/dL (ref 1.9–3.7)
Glucose, Bld: 70 mg/dL (ref 65–99)
Potassium: 4.1 mmol/L (ref 3.5–5.3)
Sodium: 141 mmol/L (ref 135–146)
Total Bilirubin: 1.5 mg/dL — ABNORMAL HIGH (ref 0.2–1.2)
Total Protein: 6.5 g/dL (ref 6.1–8.1)

## 2023-12-06 NOTE — Progress Notes (Signed)
  [address], [address] [place], [place] [address] [phone]  Audiological Evaluation    Name: Vicki Mccoy     DOB:   12-03-1980      MRN:   [id number]                                                                                     Service Date: 12/06/2023     Accompanied by: unaccompanied    Patient comes today after Dr. Suszanne Conners, ENT sent a referral for a hearing evaluation due to concerns with tinnitus.   Symptoms Yes Details  Hearing loss  [x]  1 week ago reports her hearing went away in the right ear for 20 minutes.  Tinnitus  [x]  Both ears- constant , onset 1 month ago, no known medical changes.  Ear pain/ Ear infections  []    Balance problems  []    Noise exposure  []    Previous ear surgeries  []    Family history  [x]  Daughter wears hearing aids, born with hearing loss Grandmother and aunt wear hearing aids - with age  Amplification  []    Other  []      Otoscopy: Right ear: Clear external ear canals and notable landmarks visualized on the tympanic membrane. Left ear:  Clear external ear canals and notable landmarks visualized on the tympanic membrane.  Tympanometry: Right ear: Type A- Normal external ear canal volume with normal middle ear pressure and tympanic membrane compliance Left ear: Type A- Normal external ear canal volume with normal middle ear pressure and tympanic membrane compliance    Pure tone Audiometry:    Normal hearing from [phone], then a profound presumably sensorineural hearing loss at 12000 Hz, in both ears.   Speech Audiometry: Right ear- Speech Reception Threshold (SRT) was obtained at 10 dBHL Left ear-Speech Reception Threshold (SRT) was obtained at 10 dBHL   Word Recognition Score Tested using NU-6 (MLV) Right ear: 100% was obtained at a presentation level of 55 dBHL with contralateral masking which is deemed as  excellent Left ear: 100% was obtained at a presentation level of 55 dBHL with contralateral masking which is deemed  as  excellent   The hearing test results were completed under headphones and results are deemed to be of good reliability. Test technique:  conventional     Recommendations: Follow up with ENT as scheduled for today. Return for a hearing evaluation if concerns with hearing changes arise or per MD recommendation. Use hearing protection when exposed to loud/damaging sounds.    Sheilah Rayos MARIE LEROUX-MARTINEZ, AUD

## 2023-12-07 DIAGNOSIS — H9313 Tinnitus, bilateral: Secondary | ICD-10-CM | POA: Insufficient documentation

## 2023-12-07 NOTE — Progress Notes (Signed)
Patient ID: Vicki Mccoy, female   DOB: 1981-10-05, 43 y.o.   MRN: [id number]  CC: Bilateral tinnitus  HPI:  Vicki Mccoy is a 43 y.o. female who presents today complaining of bilateral tinnitus.  She has been symptomatic for 1 month.  She describes her tinnitus as a high-pitched ringing noise.  The tinnitus is nonpulsatile.  She has also noted a transient decrease in her right ear hearing 1 week ago.  Currently she denies any significant hearing difficulty.  She has no previous otologic surgery.  She denies any recent otitis media or otitis externa.  Currently she denies any otalgia, otorrhea, or vertigo.  Past Medical History:  Diagnosis Date   Allergy    Arthritis    Cardiac sarcoidosis    Complete heart block (HCC)    Diabetes mellitus    Sickle cell trait (HCC)    URI 09/20/2010    Past Surgical History:  Procedure Laterality Date   ICD IMPLANT N/A 08/22/2022   Procedure: ICD IMPLANT;  Surgeon: Maurice Small, MD;  Location: [facility];  Service: Cardiovascular;  Laterality: N/A;    Family History  Problem Relation Age of Onset   Sickle cell anemia Mother    Multiple sclerosis Sister    Cerebral palsy Daughter    Allergic rhinitis Neg Hx    Angioedema Neg Hx    Asthma Neg Hx    Eczema Neg Hx    Urticaria Neg Hx    Immunodeficiency Neg Hx    Breast cancer Neg Hx     Social History:  reports that she has never smoked. She has never used smokeless tobacco. She reports that she does not drink alcohol and does not use drugs.  Allergies: No Known Allergies  Prior to Admission medications   Medication Sig Start Date End Date Taking? Authorizing Provider  ACCU-CHEK GUIDE test strip USE TO CHECK BLOOD SUGAR 3 TIMES DAILY 11/27/22  Yes [provider]  acetaminophen (TYLENOL) 325 MG tablet Take 2 tablets (650 mg total) by mouth every 4 (four) hours as needed for headache or mild pain. 08/23/22  Yes Graciella Freer, PA-C  cetirizine (ZYRTEC) 10 MG  tablet Take 10 mg by mouth daily. 09/09/18  Yes [provider]  diclofenac Sodium (VOLTAREN) 1 % GEL Apply 2 g topically as needed. 11/16/22  Yes [provider]  folic acid (FOLVITE) 1 MG tablet Take 2 tablets (2 mg total) by mouth daily. 12/17/22  Yes Luciano Cutter, MD  methotrexate (RHEUMATREX) 2.5 MG tablet Take 6 tablets (15 mg total) by mouth once a week. Start after completing your current bottle. Will provides refills at next appointment. 10/07/23  Yes Luciano Cutter, MD  predniSONE (DELTASONE) 10 MG tablet Take 2 tablets (20 mg total) by mouth daily with breakfast for 28 days, THEN 1 tablet (10 mg total) daily with breakfast for 28 days, THEN 0.5 tablets (5 mg total) daily with breakfast for 28 days. 10/07/23 12/30/23 Yes Luciano Cutter, MD    Blood pressure 116/76, pulse 72, height 5\' 4"  (1.626 m), weight 125 lb (56.7 kg). Exam: General: Communicates without difficulty, well nourished, no acute distress. Head: Normocephalic, no evidence injury, no tenderness, facial buttresses intact without stepoff. Face/sinus: No tenderness to palpation and percussion. Facial movement is normal and symmetric. Eyes: PERRL, EOMI. No scleral icterus, conjunctivae clear. Neuro: CN II exam reveals vision grossly intact.  No nystagmus at any point of gaze. Ears: Auricles well formed without  lesions.  Ear canals are intact without mass or lesion.  No erythema or edema is appreciated.  The TMs are intact without fluid. Nose: External evaluation reveals normal support and skin without lesions.  Dorsum is intact.  Anterior rhinoscopy reveals congested mucosa over anterior aspect of inferior turbinates and intact septum.  No purulence noted. Oral:  Oral cavity and oropharynx are intact, symmetric, without erythema or edema.  Mucosa is moist without lesions. Neck: Full range of motion without pain.  There is no significant lymphadenopathy.  No masses palpable.  Thyroid bed within normal limits to  palpation.  Parotid glands and submandibular glands equal bilaterally without mass.  Trachea is midline. Neuro:  CN 2-12 grossly intact.   Her hearing test shows normal hearing bilaterally across all frequencies.  Assessment: 1.  Bilateral subjective tinnitus. 2.  Her ear canals, tympanic membranes, and middle ear spaces are all normal.  No middle ear effusion or infection is noted. 3.  Normal hearing bilaterally across all frequencies.  Plan: 1.  The physical exam findings and the hearing test results are reviewed with the patient. 2.  The strategies to cope with tinnitus, including the use of masker, tinnitus retraining therapy, and avoidance of caffeine and alcohol are discussed. 3.  The patient is encouraged to call with any questions or concerns.  Vicki Mccoy 12/07/2023, 11:14 AM

## 2023-12-09 ENCOUNTER — Encounter: Payer: Self-pay | Admitting: Pulmonary Disease

## 2023-12-13 ENCOUNTER — Telehealth: Payer: Self-pay

## 2023-12-13 ENCOUNTER — Other Ambulatory Visit: Payer: Self-pay | Admitting: Physician Assistant

## 2023-12-13 ENCOUNTER — Encounter: Payer: Self-pay | Admitting: Pulmonary Disease

## 2023-12-13 DIAGNOSIS — Z1231 Encounter for screening mammogram for malignant neoplasm of breast: Secondary | ICD-10-CM

## 2023-12-13 NOTE — Telephone Encounter (Signed)
 Auth Submission: NO AUTH NEEDED Site of care: Site of care: [facility] Payer: [employer] dual complete  Medication & CPT/J Code(s) submitted: Avsola  (infliximab -axxq) [phone] Route of submission (phone, fax, portal): portal Phone # Fax # Auth type: Buy/Bill PB Units/visits requested: 3mg /kg every 2 weeks x 2 doses, every 4 weeks x 1 dose, then every 8 weeks thereafter  Reference number: [id number] Approval from: 12/13/23 to 11/04/24

## 2023-12-16 ENCOUNTER — Encounter: Payer: Medicare HMO | Admitting: Cardiovascular Disease

## 2023-12-18 ENCOUNTER — Encounter: Payer: Self-pay | Admitting: Pulmonary Disease

## 2023-12-24 ENCOUNTER — Encounter: Payer: Medicare HMO | Admitting: Physician Assistant

## 2023-12-25 ENCOUNTER — Ambulatory Visit (HOSPITAL_BASED_OUTPATIENT_CLINIC_OR_DEPARTMENT_OTHER): Payer: Medicare HMO | Admitting: Pulmonary Disease

## 2023-12-27 NOTE — Progress Notes (Signed)
 Remote ICD transmission.

## 2024-01-02 ENCOUNTER — Encounter: Payer: Self-pay | Admitting: Pulmonary Disease

## 2024-01-02 ENCOUNTER — Ambulatory Visit (INDEPENDENT_AMBULATORY_CARE_PROVIDER_SITE_OTHER): Payer: 59

## 2024-01-02 VITALS — BP 109/70 | HR 56 | Temp 98.4°F | Resp 18 | Ht 64.0 in | Wt 126.2 lb

## 2024-01-02 DIAGNOSIS — Z79899 Other long term (current) drug therapy: Secondary | ICD-10-CM

## 2024-01-02 DIAGNOSIS — D869 Sarcoidosis, unspecified: Secondary | ICD-10-CM

## 2024-01-02 MED ORDER — DIPHENHYDRAMINE HCL 25 MG PO CAPS
25.0000 mg | ORAL_CAPSULE | Freq: Once | ORAL | Status: AC
  Administered 2024-01-02: 25 mg via ORAL
  Filled 2024-01-02: qty 1

## 2024-01-02 MED ORDER — SODIUM CHLORIDE 0.9 % IV SOLN
3.0000 mg/kg | Freq: Once | INTRAVENOUS | Status: AC
  Administered 2024-01-02: 200 mg via INTRAVENOUS
  Filled 2024-01-02: qty 20

## 2024-01-02 MED ORDER — ACETAMINOPHEN 325 MG PO TABS
650.0000 mg | ORAL_TABLET | Freq: Once | ORAL | Status: AC
Start: 2024-01-02 — End: 2024-01-02
  Administered 2024-01-02: 650 mg via ORAL
  Filled 2024-01-02: qty 2

## 2024-01-02 NOTE — Progress Notes (Signed)
 Diagnosis:   Sarcoidosis [D86.9]    Provider:  Chilton Greathouse MD  Procedure: IV Infusion  IV Type: Peripheral, IV Location: R Forearm  Avsola (infliximab-axxq), Dose: 200 mg  Infusion Start Time: 1158  Infusion Stop Time: 1234  Post Infusion IV Care: Peripheral IV Discontinued  Discharge: Condition: Good, Destination: Home . AVS Declined  Performed by:  Loney Hering, LPN

## 2024-01-02 NOTE — Progress Notes (Unsigned)
 Cardiology Office Note:  .   Date:  01/02/2024  ID:  AMILEY SHISHIDO, DOB 1981-02-19, MRN [id number] PCP: Norm Salt, PA  [facility] Providers Cardiologist:  None Electrophysiologist:  Maurice Small, MD {  History of Present Illness: .   JENNALYNN RIVARD is a 43 y.o. female w/PMHx of DM, suspect sardoidosis, CHB   Admitted 08/21/22, she presented to [facility] ED with reports of syncope, and was found to have CHB on the monitor.  Pt lives at home with her daughter who has cerebral palsy and is completely dependent on her (dressing, bathing, feeding).  She had several episodes  10/16 where she suddenly felt lightheadedness and dizzy, and then lost consciousness, falling to the floor. EP consulted, c.MRI obtained given CHB in young patient. C.MRI highly suspicious for sarcoid and would need a PET though given her CHB, implant not delayed and planned for ICD. Pulmonary was brought on board, started on prednisone to continue until seen again with plans for further w/u in the office LVEF preserved Discharged 08/23/22, noting device dependent.  Seeing EP/cards/pulm teams since then.  Dr. Nelly Laurence last on 03/06/23, c/o a day of CP, sounded pleuritic, though not recurrent, EKG changes noted, planned for an echo Following with Pulmonary as well.  Recommending annual PFTs and ophthalmology.   Saw Dr. Anne Fu 04/30/23, no recurrent CP, Echocardiogram performed on 03/06/2023 showed EF of 55% with normal function. A small pericardial effusion was present. Could this have been a bout of pericarditis. Perhaps. It would be unusual just to have this symptom for 1 day. Overall she is feeling better. No further chest pain. Does not warrant any further testing.   Saw pulm 10/07/23, tolerating methotrexate --CONTINUE methotrexate 15 mg weekly --Reviewed cardiac PET 10/02/23 with consistent active myocardial sarcoid. Pulmonary nodules are stable --Will need to start prednisone and consider starting  infliximab pending status with latent TB RESTART prednisone taper for 20 mg x 4 weeks, 10 mg x 4 weeks and 5 mg x 4 weeks. Please take over the counter reflux medication with this (omeprazole 20 mg nightly or pepcid nightly) when you are taking the higher dose of prednisone --Will work on enrolling for Infliximab Planned for surveillance labs/testing  She has been started on Avsola (infliximab-axxq)  Remains on methotrexate, and steady/daily dose of prednisone, she can not recall the strength  Today's visit is scheduled as an annual visit  ROS:   She is doing well Tolerating meds Exercising twice daily, uses squat and step machine, and cardio training as well Reports excellent exertional capacity  Once when bending over she had a funny pain seemed to pull from her belly towrds her chest No CP otherwise No SOB No near syncope or syncope No palpitations  Device information [employer] dual chamber ICD implanted 08/22/22   Studies Reviewed: Marland Kitchen    EKG not done today  DEVICE interrogation done today and reviewed by myself Battery and lead measurements are good She has had tachycardia in her monitor zone At baseline she has slightly variable morphology on her V sense amp Tachy episoed have a 1:1 correlation, discriminator morphology appears same/similar to baseline (faster) Appears a SVT (suspect ST) Rates 170's 1st treatment zone starts 200   PET/CT 10/02/23 -    FDG uptake findings are consistent with active myocardial inflammation/sarcoidosis in multiple segments. There appears to be a small area of fixed reduction in perfusion in the basal septum, probably due to postinflammatory scar.   FDG uptake was observed.  FDG uptake was focal. FDG uptake was intense in the mid to basal septal segment(s). There is abnormal perfusion in the basal part of this area. There is also intense FDG uptake in the mid-apical anterior wall, with normal perfusion in that area. A smaller area of mild FDG  uptake is seen in the basal lateral left ventricular wall. LV perfusion is abnormal. There is no evidence of ischemia. Defect 1: There is a small defect with moderate reduction in uptake present in the basal septal location(s). FDG uptake was present in the described defect segment(s). There is abnormal wall motion in the defect area.   Left ventricular function is normal. EF: 59%. End diastolic cavity size is normal. End systolic cavity size is normal. - Stable pulmonary findings   08/22/22: c.MRI IMPRESSION: 1.  Normal LV size with EF 63%, normal wall motion.   2.  Normal RV size with EF 63%.   3. Nodular mid-wall LGE in the mid septum and the mid anterior wall (smaller).   4.  Nonspecific mild elevation in extracellular volume percentage.   5.  Borderline elevated T2 signal in the mid septum (54).   The appearance of LGE (nodular and circumscribed) is concerning for cardiac sarcoidosis.     08/22/22: TTE  1. Left ventricular ejection fraction, by estimation, is 55%. The left  ventricle has normal function. The left ventricle has no regional wall  motion abnormalities. Left ventricular diastolic parameters are  indeterminate.   2. Right ventricular systolic function is normal. The right ventricular  size is normal. There is normal pulmonary artery systolic pressure. The  estimated right ventricular systolic pressure is 26.6 mmHg.   3. Left atrial size was mildly dilated.   4. The mitral valve is normal in structure. Mild mitral valve  regurgitation. No evidence of mitral stenosis.   5. The aortic valve is tricuspid. Aortic valve regurgitation is not  visualized. No aortic stenosis is present.   6. The inferior vena cava is normal in size with greater than 50%  respiratory variability, suggesting right atrial pressure of 3 mmHg.   7. The patient was in complete heart block.      Risk Assessment/Calculations:    Physical Exam:   VS:  There were no vitals taken for this  visit.   Wt Readings from Last 3 Encounters:  01/02/24 126 lb 3.2 oz (57.2 kg)  12/06/23 125 lb (56.7 kg)  12/05/23 124 lb (56.2 kg)    GEN: Well nourished, well developed in no acute distress NECK: No JVD; No carotid bruits CARDIAC: RRR, no murmurs, rubs, gallops RESPIRATORY:   CTA b/l without rales, wheezing or rhonchi  ABDOMEN: Soft, non-tender, non-distended EXTREMITIES: No edema; No deformity   ICD site: is stable, no thinning, fluctuation, tethering  ASSESSMENT AND PLAN: .    ICD CHB 3.   cardiac sarcoid by PET in Dec 2024 Pulm team management as above  No VT Tachy episode in my review are most c/w ST, timing aligns with her work outs In d/w Dr. Anne Fu today, given stability, no symptoms, VT, would no change anything, keep her visit with him in June.  EP otherwise in a year again, remotes as usual (her APP had shut down, device clinic able to get her back on line today)      Dispo: as above, sooner if needed, remotes as usual  Signed, Sheilah Pigeon, PA-C

## 2024-01-03 ENCOUNTER — Ambulatory Visit: Payer: 59 | Attending: Physician Assistant | Admitting: Physician Assistant

## 2024-01-03 ENCOUNTER — Encounter: Payer: Self-pay | Admitting: Physician Assistant

## 2024-01-03 VITALS — BP 112/64 | HR 65 | Ht 64.0 in | Wt 128.6 lb

## 2024-01-03 DIAGNOSIS — Z95810 Presence of automatic (implantable) cardiac defibrillator: Secondary | ICD-10-CM

## 2024-01-03 DIAGNOSIS — D8685 Sarcoid myocarditis: Secondary | ICD-10-CM

## 2024-01-03 LAB — CUP PACEART INCLINIC DEVICE CHECK
Battery Remaining Longevity: 94 mo
Brady Statistic RA Percent Paced: 11 %
Brady Statistic RV Percent Paced: 15 %
Date Time Interrogation Session: 20250228130329
HighPow Impedance: 57.375
Implantable Lead Connection Status: 753985
Implantable Lead Connection Status: 753985
Implantable Lead Implant Date: 20231018
Implantable Lead Implant Date: 20231018
Implantable Lead Location: 753859
Implantable Lead Location: 753860
Implantable Pulse Generator Implant Date: 20231018
Lead Channel Impedance Value: 350 Ohm
Lead Channel Impedance Value: 350 Ohm
Lead Channel Pacing Threshold Amplitude: 0.5 V
Lead Channel Pacing Threshold Amplitude: 1 V
Lead Channel Pacing Threshold Pulse Width: 0.5 ms
Lead Channel Pacing Threshold Pulse Width: 0.5 ms
Lead Channel Sensing Intrinsic Amplitude: 1.3 mV
Lead Channel Sensing Intrinsic Amplitude: 4.3 mV
Lead Channel Setting Pacing Amplitude: 1.25 V
Lead Channel Setting Pacing Amplitude: 1.5 V
Lead Channel Setting Pacing Pulse Width: 0.5 ms
Lead Channel Setting Sensing Sensitivity: 0.5 mV
Pulse Gen Serial Number: 211001606
Zone Setting Status: 755011

## 2024-01-03 NOTE — Patient Instructions (Signed)
 Medication Instructions:    Your physician recommends that you continue on your current medications as directed. Please refer to the Current Medication list given to you today.   *If you need a refill on your cardiac medications before your next appointment, please call your pharmacy*   Lab Work: NONE ORDERED  TODAY     If you have labs (blood work) drawn today and your tests are completely normal, you will receive your results only by: MyChart Message (if you have MyChart) OR A paper copy in the mail If you have any lab test that is abnormal or we need to change your treatment, we will call you to review the results.   Testing/Procedures:  NONE ORDERED  TODAY     Follow-Up: At [facility], you and your health needs are our priority.  As part of our continuing mission to provide you with exceptional heart care, we have created designated Provider Care Teams.  These Care Teams include your primary Cardiologist (physician) and Advanced Practice Providers (APPs -  Physician Assistants and Nurse Practitioners) who all work together to provide you with the care you need, when you need it.  We recommend signing up for the patient portal called "MyChart".  Sign up information is provided on this After Visit Summary.  MyChart is used to connect with patients for Virtual Visits (Telemedicine).  Patients are able to view lab/test results, encounter notes, upcoming appointments, etc.  Non-urgent messages can be sent to your provider as well.   To learn more about what you can do with MyChart, go to [email or web address].      Your next appointment:   DR Anne Fu IN JUNE     1 year(s)   Provider:    You may see Maurice Small, MD or one of the following Advanced Practice Providers on your designated Care Team:   Francis Dowse, [place]    Other Instructions    1st Floor: - Lobby - Registration  - Pharmacy  - Lab - Cafe  2nd Floor: - PV Lab - Diagnostic Testing  (echo, CT, nuclear med)  3rd Floor: - Vacant  4th Floor: - TCTS (cardiothoracic surgery) - AFib Clinic - Structural Heart Clinic - Vascular Surgery  - Vascular Ultrasound  5th Floor: - HeartCare Cardiology (general and EP) - Clinical Pharmacy for coumadin, hypertension, lipid, weight-loss medications, and med management appointments    Valet parking services will be available as well.

## 2024-01-15 ENCOUNTER — Encounter (HOSPITAL_BASED_OUTPATIENT_CLINIC_OR_DEPARTMENT_OTHER): Payer: Self-pay | Admitting: Pulmonary Disease

## 2024-01-15 ENCOUNTER — Ambulatory Visit (INDEPENDENT_AMBULATORY_CARE_PROVIDER_SITE_OTHER): Payer: Medicare HMO | Admitting: Pulmonary Disease

## 2024-01-15 VITALS — BP 128/74 | HR 64 | Ht 64.0 in | Wt 125.4 lb

## 2024-01-15 DIAGNOSIS — Z79899 Other long term (current) drug therapy: Secondary | ICD-10-CM | POA: Diagnosis not present

## 2024-01-15 DIAGNOSIS — D8685 Sarcoid myocarditis: Secondary | ICD-10-CM | POA: Diagnosis not present

## 2024-01-15 DIAGNOSIS — R918 Other nonspecific abnormal finding of lung field: Secondary | ICD-10-CM | POA: Diagnosis not present

## 2024-01-15 MED ORDER — METHOTREXATE SODIUM 2.5 MG PO TABS
10.0000 mg | ORAL_TABLET | ORAL | 2 refills | Status: AC
Start: 2024-01-15 — End: ?

## 2024-01-15 NOTE — Progress Notes (Signed)
 Subjective:   PATIENT ID: Vicki Mccoy GENDER: female DOB: [birth year], MRN: [id number]  Chief Complaint  Patient presents with   Follow-up    Sarcoidosis    Reason for Visit: Follow-up  [name] is a 43 year old female with CHB s/p ICD 08/2022, DM2, GERD, migraines who presents for follow-up for sarcoid  Synopsis: She was admitted in October 2023 for syncope and diagnosed with CHB s/p ICD placement. Cardiac MRI demonstrated concerns for sarcoidosis She was started prednisone taper 40 mg x 1 month followed by 30 mg daily. Tolerating well. No reports of fever, fatigue, weight loss, visual disturbance, dyspnea, cough, palpitations, abdominal pain, numbness, tingling, lightheadedness, syncope   11/26/22 She is currently on prednisone 20 mg daily. Tolerating well. Denies shortness of breath, wheezing or cough. Denies chest pain. Denies limitation in activity. Daughter has CP and she is the main caregiver. Has some occasional shoulder pain. Denies fatigue, weight loss, night sweats  01/22/23 Started on [facility] 12/20/22. No current GI issues. Did have stomach pain after doing 15 day GI cleanse while on this medication. Otherwise feels she is tolerating well. Chief complaint comments on occasional breathing issues relieved with rest however on interview patient denied respiratory symptoms. Denied shortness of breath, cough or wheezing. Her right shoulder pain has worsened and has more limited range of motion.  03/29/23 Since our last visit she tolerating methotrexate except for skin itching with sun exposure. Benadryl has not helped. Denies and abdominal pain, nausea, oral ulcers or recent infections. Her prior shoulder pain has improved. Seen by Ortho.  07/03/23 Since our last visit she is doing well on methotrexate 15 mg weekly. Did not decrease to 10. No oral ulcers, nausea or abdominal pain. Denies fatigue, chest pain, shortness of breath or cough.  10/07/23 Since our last visit  she is on methotrexate 15 mg weekly and folic acid daily. Tolerating methotrexate well. No GI upset or oral ulcers. No significant illness since our last visit.  01/15/24 Since our last visit she has received infliximab x 2. Has been taking methotrexate 15 mg. Not taking folic acid. Tolerating current regimen well. Denies shortness of breath, cough, wheezing, chest pain, leg swelling, GI upset.  Social History: Never smoker Daughter with cerebral palsy  Past Medical History:  Diagnosis Date   Allergy    Arthritis    Cardiac sarcoidosis    Complete heart block (HCC)    Diabetes mellitus    Sickle cell trait (HCC)    URI 09/20/2010     Family History  Problem Relation Age of Onset   Sickle cell anemia Mother    Multiple sclerosis Sister    Cerebral palsy Daughter    Allergic rhinitis Neg Hx    Angioedema Neg Hx    Asthma Neg Hx    Eczema Neg Hx    Urticaria Neg Hx    Immunodeficiency Neg Hx    Breast cancer Neg Hx      Social History   Occupational History   Occupation: Technical sales engineer: UNEMPLOYED    Comment: housekeeping  Tobacco Use   Smoking status: Never   Smokeless tobacco: Never  Vaping Use   Vaping status: Never Used  Substance and Sexual Activity   Alcohol use: No    Alcohol/week: 0.0 standard drinks of alcohol   Drug use: No   Sexual activity: Not Currently    Partners: Male    Birth control/protection: Pill    No Known Allergies  Outpatient Medications Prior to Visit  Medication Sig Dispense Refill   ACCU-CHEK GUIDE test strip USE TO CHECK BLOOD SUGAR 3 TIMES DAILY     acetaminophen (TYLENOL) 325 MG tablet Take 2 tablets (650 mg total) by mouth every 4 (four) hours as needed for headache or mild pain.     cetirizine (ZYRTEC) 10 MG tablet Take 10 mg by mouth daily.     fluticasone (FLONASE) 50 MCG/ACT nasal spray Place 1 spray into both nostrils daily.     folic acid (FOLVITE) 1 MG tablet Take 2 tablets (2 mg total) by mouth daily. 180 tablet  3   methotrexate (RHEUMATREX) 2.5 MG tablet Take 6 tablets (15 mg total) by mouth once a week. Start after completing your current bottle. Will provides refills at next appointment. 72 tablet 2   No facility-administered medications prior to visit.    Review of Systems  Constitutional:  Negative for chills, diaphoresis, fever, malaise/fatigue and weight loss.  HENT:  Negative for congestion.   Respiratory:  Negative for cough, hemoptysis, sputum production, shortness of breath and wheezing.   Cardiovascular:  Negative for chest pain, palpitations and leg swelling.     Objective:   Vitals:   01/15/24 1058  BP: 128/74  Pulse: 64  SpO2: 100%  Weight: 125 lb 6.4 oz (56.9 kg)  Height: 5\' 4"  (1.626 m)   SpO2: 100 %  Physical Exam: General: Well-appearing, no acute distress HENT: [place], AT Eyes: EOMI, no scleral icterus Respiratory: Clear to auscultation bilaterally.  No crackles, wheezing or rales Cardiovascular: RRR, -M/R/G, no JVD Extremities:-Edema,-tenderness Neuro: AAO x4, CNII-XII grossly intact Psych: Normal mood, normal affect   Data Reviewed:  Imaging: MR Cardiac 08/22/22 - Concerning for cardiac sarcoid CT Chest 08/23/22 - No mediastinal or hilar adenopathy. Scattered subcentimeter pulmonary nodules with largest 6 mm in RUL CT Chest 01/16/23 - Minimal/mild scattered peribronchovascular nodularity. Stable nodules including RUL measuring 6 mm. Tiny bilateral pleural effusions.  PET/CT 10/02/23 -   FDG uptake findings are consistent with active myocardial inflammation/sarcoidosis in multiple segments. There appears to be a small area of fixed reduction in perfusion in the basal septum, probably due to postinflammatory scar.   FDG uptake was observed. FDG uptake was focal. FDG uptake was intense in the mid to basal septal segment(s). There is abnormal perfusion in the basal part of this area. There is also intense FDG uptake in the mid-apical anterior wall, with normal  perfusion in that area. A smaller area of mild FDG uptake is seen in the basal lateral left ventricular wall. LV perfusion is abnormal. There is no evidence of ischemia. Defect 1: There is a small defect with moderate reduction in uptake present in the basal septal location(s). FDG uptake was present in the described defect segment(s). There is abnormal wall motion in the defect area.   Left ventricular function is normal. EF: 59%. End diastolic cavity size is normal. End systolic cavity size is normal. - Stable pulmonary findings  PFT: 09/20/15 FVC 2.52 (107%) FEV1 2.18 (99%) Ratio 86   Interpretation: Normal spirometry  Labs:    Latest Ref Rng & Units 12/05/2023   10:23 AM 10/07/2023   12:00 AM 07/03/2023   11:32 AM  CBC  WBC 3.8 - 10.8 Thousand/uL 3.9  3.8  5.1   Hemoglobin 11.7 - 15.5 g/dL 16.1  09.6  04.5   Hematocrit 35.0 - 45.0 % 36.5  39.5  37.7   Platelets 140 - 400 Thousand/uL 275  273  268   Normal blood counts     Latest Ref Rng & Units 12/05/2023   10:23 AM 10/07/2023   12:00 AM 07/03/2023   11:32 AM  BMP  Glucose 65 - 99 mg/dL 70  83  83   BUN 7 - 25 mg/dL 16  16  19    Creatinine 0.50 - 0.99 mg/dL 4.09  8.11  9.14   BUN/Creat Ratio 6 - 22 (calc) SEE NOTE:  17  20   Sodium 135 - 146 mmol/L 141  142  140   Potassium 3.5 - 5.3 mmol/L 4.1  4.4  4.6   Chloride 98 - 110 mmol/L 107  106  104   CO2 20 - 32 mmol/L 27  26  22    Calcium 8.6 - 10.2 mg/dL 9.4  9.3  9.5   Normal electrolytes and renal function     Latest Ref Rng & Units 12/05/2023   10:23 AM 10/07/2023   12:00 AM 07/03/2023   11:32 AM  Hepatic Function  Total Protein 6.1 - 8.1 g/dL 6.5  6.7  6.7   Albumin 3.9 - 4.9 g/dL  4.2  4.2   AST 10 - 30 U/L 13  14  11    ALT 6 - 29 U/L 14  14  11    Alk Phosphatase 44 - 121 IU/L  64  70   Total Bilirubin 0.2 - 1.2 mg/dL 1.5  0.6  0.8   Mildly elevated T bili otherwise remaining LFTs normal        Assessment & Plan:   Discussion: 43 year old female with CHB s/p  ICD 08/2022, DM2, GERD, migraines who presents for sarcoid follow-up. On immunosuppressants for active cardiac sarcoid.  We discussed the clinical course of sarcoid and management including serial PFTs, labs, eye exam, and EKG and chest imaging if indicated. Uncontrolled on methotrexate. Immunosuppression plan discussed below. Discussed adverse effects of prednisone, methotrexate and infliximab. Addressed patients questions/concerns. Agrees to pursue addition of infliximab if this is an option. Will treat current flare with prednisone and methotrexate in the interim  Cardiac sarcoidosis Complete heart block s/p ICD 08/2022 --Cardiac MRI 08/22/22 concerning for sarcoidosis --Prednisone 11/26/21 - self-discontinued  --DECREASE methotrexate to 10 mg weekly --Reviewed cardiac PET 10/02/23 with consistent active myocardial sarcoid. Pulmonary nodules are stable --Will plan to follow-up in 07/2024 (6 months off prednisone and on only methotrexate and infliximab) and will order repeat PET/sarcoid to evaluate treatment response  History of immunosuppression High risk medication management --Prednisone 08/23/22-current. Initially 40 mg --Prednisone 1/22/2-2/12/24 10 mg daily --Starting methotrexate 12/20/2022  --03/29/23 Reduced methotrexate to 15 mg weekly d/t skin sensitivity --10/07/23-02/03/24 Restarted and wean off prednisone taper --11/21/23 Infliximab started --Wean off prednisone 5 mg daily in two weeks --DECREASE methotrexate 10 mg weekly and folic acid 1 mg daily --Wean off prednisone 5 mg daily x 2 weeks. Needs to be off by end of the month --CONTINUE Infliximab per schedule --ORDER labs: for monitoring below  Sarcoid Monitoring --Recent chest imaging reviewed.  --Annual PFTs.  Last PFTs 09/2015 --Annual ophthalmology exam.  Last visit on 2023 --Recent EKG and TTE reviewed. 08/22/22 Normal except for CHB --Routine labs as needed: CBC with diff, CMET  Subcentimeter pulmonary nodules with  largest 6 mm in RUL - stable --CT Chest 01/16/23 reviewed with stable nodules  Hx latent TB Treated x 6 months with health department at 43 years old. Contracted from exposure in the [place] (reports Spanish speaking friend).    Health Maintenance  Immunization History  Administered Date(s) Administered   Influenza Whole 10/06/2008, 11/01/2009, 09/20/2010   Td 06/20/2010   CT Lung Screen - not qualified. Insufficient tobacco history  No orders of the defined types were placed in this encounter.  No orders of the defined types were placed in this encounter.  No follow-ups on file.  I have spent a total time of 40-minutes on the day of the appointment including chart review, data review, collecting history, coordinating care and discussing medical diagnosis and plan with the patient/family. Past medical history, allergies, medications were reviewed. Pertinent imaging, labs and tests included in this note have been reviewed and interpreted independently by me.   Donnarae Rae Mechele Collin, MD [facility] 01/15/2024 11:03 AM

## 2024-01-15 NOTE — Patient Instructions (Addendum)
 Sarcoid flare --Wean off prednisone 5 mg daily in two weeks --DECREASE methotrexate 10 mg weekly and folic acid 1 mg daily --Wean off prednisone 5 mg daily x 2 weeks. Needs to be off by end of the month --CONTINUE Infliximab per schedule --ORDER labs: for monitoring below --Will plan to follow-up in 07/2024 (6 months off prednisone and on only methotrexate and infliximab) and will order repeat PET/sarcoid to evaluate treatment response

## 2024-01-16 ENCOUNTER — Encounter (HOSPITAL_BASED_OUTPATIENT_CLINIC_OR_DEPARTMENT_OTHER): Payer: Self-pay | Admitting: Pulmonary Disease

## 2024-01-16 DIAGNOSIS — E782 Mixed hyperlipidemia: Secondary | ICD-10-CM | POA: Diagnosis not present

## 2024-01-16 DIAGNOSIS — J301 Allergic rhinitis due to pollen: Secondary | ICD-10-CM | POA: Diagnosis not present

## 2024-01-16 DIAGNOSIS — G43019 Migraine without aura, intractable, without status migrainosus: Secondary | ICD-10-CM | POA: Diagnosis not present

## 2024-01-16 DIAGNOSIS — E1142 Type 2 diabetes mellitus with diabetic polyneuropathy: Secondary | ICD-10-CM | POA: Diagnosis not present

## 2024-01-16 DIAGNOSIS — E1165 Type 2 diabetes mellitus with hyperglycemia: Secondary | ICD-10-CM | POA: Diagnosis not present

## 2024-01-16 LAB — CBC WITH DIFFERENTIAL/PLATELET
Basophils Absolute: 0 10*3/uL (ref 0.0–0.2)
Basos: 1 %
EOS (ABSOLUTE): 0 10*3/uL (ref 0.0–0.4)
Eos: 1 %
Hematocrit: 37.4 % (ref 34.0–46.6)
Hemoglobin: 12 g/dL (ref 11.1–15.9)
Immature Grans (Abs): 0 10*3/uL (ref 0.0–0.1)
Immature Granulocytes: 1 %
Lymphocytes Absolute: 0.7 10*3/uL (ref 0.7–3.1)
Lymphs: 12 %
MCH: 29.3 pg (ref 26.6–33.0)
MCHC: 32.1 g/dL (ref 31.5–35.7)
MCV: 91 fL (ref 79–97)
Monocytes Absolute: 0.4 10*3/uL (ref 0.1–0.9)
Monocytes: 7 %
Neutrophils Absolute: 4.5 10*3/uL (ref 1.4–7.0)
Neutrophils: 78 %
Platelets: 220 10*3/uL (ref 150–450)
RBC: 4.1 x10E6/uL (ref 3.77–5.28)
RDW: 14.9 % (ref 11.7–15.4)
WBC: 5.7 10*3/uL (ref 3.4–10.8)

## 2024-01-16 LAB — COMPREHENSIVE METABOLIC PANEL
ALT: 15 IU/L (ref 0–32)
AST: 15 IU/L (ref 0–40)
Albumin: 4.3 g/dL (ref 3.9–4.9)
Alkaline Phosphatase: 61 IU/L (ref 44–121)
BUN/Creatinine Ratio: 9 (ref 9–23)
BUN: 10 mg/dL (ref 6–24)
Bilirubin Total: 0.8 mg/dL (ref 0.0–1.2)
CO2: 23 mmol/L (ref 20–29)
Calcium: 9.4 mg/dL (ref 8.7–10.2)
Chloride: 108 mmol/L — ABNORMAL HIGH (ref 96–106)
Creatinine, Ser: 1.06 mg/dL — ABNORMAL HIGH (ref 0.57–1.00)
Globulin, Total: 2 g/dL (ref 1.5–4.5)
Glucose: 100 mg/dL — ABNORMAL HIGH (ref 70–99)
Potassium: 4.6 mmol/L (ref 3.5–5.2)
Sodium: 141 mmol/L (ref 134–144)
Total Protein: 6.3 g/dL (ref 6.0–8.5)
eGFR: 66 mL/min/{1.73_m2} (ref >59)

## 2024-01-21 ENCOUNTER — Ambulatory Visit
Admission: RE | Admit: 2024-01-21 | Discharge: 2024-01-21 | Disposition: A | Discharge status: Home / Self Care | Payer: Medicare HMO | Source: Ambulatory Visit | Attending: Physician Assistant | Admitting: Physician Assistant

## 2024-01-21 DIAGNOSIS — Z1231 Encounter for screening mammogram for malignant neoplasm of breast: Secondary | ICD-10-CM | POA: Diagnosis not present

## 2024-02-21 ENCOUNTER — Ambulatory Visit (INDEPENDENT_AMBULATORY_CARE_PROVIDER_SITE_OTHER): Payer: Medicare HMO

## 2024-02-21 DIAGNOSIS — I442 Atrioventricular block, complete: Secondary | ICD-10-CM

## 2024-02-22 LAB — CUP PACEART REMOTE DEVICE CHECK
Battery Remaining Longevity: 92 mo
Battery Remaining Percentage: 83 %
Battery Voltage: 2.99 V
Brady Statistic AP VP Percent: 1 %
Brady Statistic AP VS Percent: 9 %
Brady Statistic AS VP Percent: 1 %
Brady Statistic AS VS Percent: 90 %
Brady Statistic RA Percent Paced: 7.4 %
Brady Statistic RV Percent Paced: 1 %
Date Time Interrogation Session: 20250417220304
HighPow Impedance: 55 Ohm
Implantable Lead Connection Status: 753985
Implantable Lead Connection Status: 753985
Implantable Lead Implant Date: 20231018
Implantable Lead Implant Date: 20231018
Implantable Lead Location: 753859
Implantable Lead Location: 753860
Implantable Pulse Generator Implant Date: 20231018
Lead Channel Impedance Value: 350 Ohm
Lead Channel Impedance Value: 350 Ohm
Lead Channel Pacing Threshold Amplitude: 0.5 V
Lead Channel Pacing Threshold Amplitude: 1.125 V
Lead Channel Pacing Threshold Pulse Width: 0.5 ms
Lead Channel Pacing Threshold Pulse Width: 0.5 ms
Lead Channel Sensing Intrinsic Amplitude: 2.6 mV
Lead Channel Sensing Intrinsic Amplitude: 3 mV
Lead Channel Setting Pacing Amplitude: 1.375
Lead Channel Setting Pacing Amplitude: 1.5 V
Lead Channel Setting Pacing Pulse Width: 0.5 ms
Lead Channel Setting Sensing Sensitivity: 0.5 mV
Pulse Gen Serial Number: 211001606
Zone Setting Status: 755011

## 2024-02-26 NOTE — Progress Notes (Unsigned)
 NEUROLOGY FOLLOW UP OFFICE NOTE  Vicki Mccoy [id number]  Assessment/Plan:   Menstrual migraine, stable   Migraine prevention:  Deferred.  Doing well *** Migraine rescue:  Ubrelvy 100mg .  Due to recent heart block, would like to avoid triptans.   Limit use of pain relievers to no more than 2 days out of week to prevent risk of rebound or medication-overuse headache. Keep headache diary Follow up 6 months .       Subjective:  Vicki Mccoy is a 43 year old female with sarcoidosis with cardiac involvement (heart block s/p ICD), DM 2 with polyneuropathy, arthritis and Sickle cell trait who follows up for migraines.   UPDATE: *** Intensity:  10/10 Duration:  30 minutes with Vicki Mccoy but returns after 6-7 hours for 3 consecutive days Frequency:  ***  Tried Zavzpret ***  Sometimes her legs jerk in her sleep and it wakes her up.  It doesn't occur frequently.      Current NSAIDS/analgesics:  Tylenol  Current triptans: Would avoid due to heart block Current ergotamine:  none Current anti-emetic:  none Current muscle relaxants:  none Current Antihypertensive medications:  none Current Antidepressant medications:  none Current Anticonvulsant medications: none Current anti-CGRP:  Ubrelvy 100mg , Zavzpret (samples) *** Current Vitamins/Herbal/Supplements:  D Current Antihistamines/Decongestants:  none Other therapy:  none     Caffeine:  coffee occasionally.  Occasional soda Alcohol:  no Smoker:  no Diet:  5-6 bottles water daily.  Rarely soda.   Exercise:  walks Depression:  no; Anxiety:  no Other pain:  no Sleep hygiene:  good   HISTORY:  Onset:  Menstrual migraines as a teenager  They subsequently resolved but returned about 2 months ago.   Location:  left temple radiating into eye Quality:  throbbing Intensity:  10/10 Aura:  absent Prodrome:  absent Associated symptoms:  Photophobia, sometimes blurred vision.  She denies associated nausea, vomiting, phonophobia,  osmophobia, autonomic symptoms, unilateral numbness or weakness. Duration:  3-4 days.  Aborts quickly with [facility] but pain will returns 8-9 hours later Frequency:  Occurs with her cycle.  Starts one day before menses Triggers:  Menses Relieving factors:  BC powder Activity:  aggravates     Past NSAIDS/analgesics:  naproxen, ibuprofen, Tylenol , Excedrin igraine Past abortive triptans:  sumatriptan tab, zolmitriptan  (dizziness) Past abortive ergotamine:  none Past muscle relaxants:  cyclobenzaprine  Past anti-emetic:  none Past antihypertensive medications:  furosemide Past antidepressant medications:  none Past anticonvulsant medications:  topiramate  (blurred vision, fatigue), gabapentin Past anti-CGRP:  none Past vitamins/Herbal/Supplements:  none Past antihistamines/decongestants:  meclizine , Zyrtec, Flonase  Other past therapies:  none   PAST MEDICAL HISTORY: Past Medical History:  Diagnosis Date   Allergy    Arthritis    Cardiac sarcoidosis    Complete heart block (HCC)    Diabetes mellitus    Sickle cell trait (HCC)    URI 09/20/2010    MEDICATIONS: Current Outpatient Medications on File Prior to Visit  Medication Sig Dispense Refill   ACCU-CHEK GUIDE test strip USE TO CHECK BLOOD SUGAR 3 TIMES DAILY     acetaminophen  (TYLENOL ) 325 MG tablet Take 2 tablets (650 mg total) by mouth every 4 (four) hours as needed for headache or mild pain.     cetirizine (ZYRTEC) 10 MG tablet Take 10 mg by mouth daily.     fluticasone  (FLONASE ) 50 MCG/ACT nasal spray Place 1 spray into both nostrils daily.     folic acid  (FOLVITE ) 1 MG tablet Take 2 tablets (2 mg total)  by mouth daily. 180 tablet 3   methotrexate  (RHEUMATREX) 2.5 MG tablet Take 4 tablets (10 mg total) by mouth once a week. ill provides refills at next appointment. 48 tablet 2   No current facility-administered medications on file prior to visit.    ALLERGIES: No Known Allergies  FAMILY HISTORY: Family History  Problem  Relation Age of Onset   Sickle cell anemia Mother    Multiple sclerosis Sister    Cerebral palsy Daughter    Allergic rhinitis Neg Hx    Angioedema Neg Hx    Asthma Neg Hx    Eczema Neg Hx    Urticaria Neg Hx    Immunodeficiency Neg Hx    Breast cancer Neg Hx       Objective:  *** General: No acute distress.  Patient appears well-groomed.   ***    Janne Members, DO  CC: Vicki Goo, PA

## 2024-02-27 ENCOUNTER — Encounter: Payer: Self-pay | Admitting: Neurology

## 2024-02-27 ENCOUNTER — Ambulatory Visit (INDEPENDENT_AMBULATORY_CARE_PROVIDER_SITE_OTHER): Payer: Medicare HMO | Admitting: Neurology

## 2024-02-27 VITALS — BP 118/71 | HR 65 | Ht 64.0 in | Wt 124.0 lb

## 2024-02-27 DIAGNOSIS — G43829 Menstrual migraine, not intractable, without status migrainosus: Secondary | ICD-10-CM | POA: Diagnosis not present

## 2024-02-27 NOTE — Progress Notes (Signed)
 Medication Samples have been provided to the patient.  Drug name: Nurtec       Strength: 75 mg        Qty: 3  LOT: [id number]  Exp.Date: 6/28  Dosing instructions: as needed  The patient has been instructed regarding the correct time, dose, and frequency of taking this medication, including desired effects and most common side effects.   Vicki Mccoy 11:15 AM 02/27/2024

## 2024-02-27 NOTE — Patient Instructions (Signed)
 Next time you get a migraine, take Nurtec - take 1 daily for 3 days.  Let me know if it works

## 2024-02-28 ENCOUNTER — Ambulatory Visit: Payer: 59

## 2024-02-28 VITALS — BP 108/66 | HR 63 | Temp 97.9°F | Resp 18 | Ht 64.0 in | Wt 123.6 lb

## 2024-02-28 DIAGNOSIS — D869 Sarcoidosis, unspecified: Secondary | ICD-10-CM | POA: Diagnosis not present

## 2024-02-28 DIAGNOSIS — Z79899 Other long term (current) drug therapy: Secondary | ICD-10-CM | POA: Diagnosis not present

## 2024-02-28 MED ORDER — ACETAMINOPHEN 325 MG PO TABS
650.0000 mg | ORAL_TABLET | Freq: Once | ORAL | Status: AC
  Administered 2024-02-28: 650 mg via ORAL
  Filled 2024-02-28: qty 2

## 2024-02-28 MED ORDER — DIPHENHYDRAMINE HCL 25 MG PO CAPS
25.0000 mg | ORAL_CAPSULE | Freq: Once | ORAL | Status: AC
  Administered 2024-02-28: 25 mg via ORAL
  Filled 2024-02-28: qty 1

## 2024-02-28 MED ORDER — SODIUM CHLORIDE 0.9 % IV SOLN
3.0000 mg/kg | Freq: Once | INTRAVENOUS | Status: AC
  Administered 2024-02-28: 200 mg via INTRAVENOUS
  Filled 2024-02-28: qty 20

## 2024-02-28 NOTE — Progress Notes (Signed)
 Diagnosis: Sarcoidosis  Provider:  Mannam, Praveen MD  Procedure: IV Infusion  IV Type: Peripheral, IV Location: L Forearm  Avsola  (infliximab -axxq), Dose: 200 mg  Infusion Start Time: 1038  Infusion Stop Time: 1248  Post Infusion IV Care: Patient declined observation and Peripheral IV Discontinued  Discharge: Condition: Stable, Destination: Home . AVS Declined  Performed by:  Lendel Quant, RN

## 2024-03-05 ENCOUNTER — Encounter: Payer: Self-pay | Admitting: Cardiovascular Disease

## 2024-04-01 NOTE — Addendum Note (Signed)
 Addended by: Lott Rouleau A on: 04/01/2024 08:23 AM   Modules accepted: Orders

## 2024-04-01 NOTE — Progress Notes (Signed)
 Remote ICD transmission.

## 2024-04-24 ENCOUNTER — Other Ambulatory Visit: Payer: Self-pay

## 2024-04-24 ENCOUNTER — Ambulatory Visit

## 2024-04-24 DIAGNOSIS — Z79899 Other long term (current) drug therapy: Secondary | ICD-10-CM

## 2024-04-24 DIAGNOSIS — D869 Sarcoidosis, unspecified: Secondary | ICD-10-CM

## 2024-04-24 MED ORDER — DIPHENHYDRAMINE HCL 25 MG PO CAPS: 25.0000 mg | ORAL_CAPSULE | Freq: Once | ORAL | Status: DC

## 2024-04-24 MED ORDER — ACETAMINOPHEN 325 MG PO TABS: 650.0000 mg | ORAL_TABLET | Freq: Once | ORAL | Status: DC

## 2024-04-27 ENCOUNTER — Encounter: Payer: Self-pay | Admitting: Pulmonary Disease

## 2024-05-04 ENCOUNTER — Other Ambulatory Visit: Payer: Self-pay

## 2024-05-04 ENCOUNTER — Ambulatory Visit

## 2024-05-04 VITALS — BP 115/64 | HR 52 | Temp 98.0°F | Resp 16 | Ht 64.0 in | Wt 121.4 lb

## 2024-05-04 DIAGNOSIS — D869 Sarcoidosis, unspecified: Secondary | ICD-10-CM

## 2024-05-04 DIAGNOSIS — Z79899 Other long term (current) drug therapy: Secondary | ICD-10-CM

## 2024-05-04 MED ORDER — SODIUM CHLORIDE 0.9 % IV SOLN
3.0000 mg/kg | Freq: Once | INTRAVENOUS | Status: AC
  Administered 2024-05-04: 200 mg via INTRAVENOUS
  Filled 2024-05-04: qty 20

## 2024-05-04 MED ORDER — DIPHENHYDRAMINE HCL 25 MG PO CAPS
25.0000 mg | ORAL_CAPSULE | Freq: Once | ORAL | Status: AC
Start: 2024-05-04 — End: 2024-05-04
  Administered 2024-05-04: 25 mg via ORAL
  Filled 2024-05-04: qty 1

## 2024-05-04 MED ORDER — ACETAMINOPHEN 325 MG PO TABS
650.0000 mg | ORAL_TABLET | Freq: Once | ORAL | Status: AC
  Administered 2024-05-04: 650 mg via ORAL
  Filled 2024-05-04: qty 2

## 2024-05-04 NOTE — Progress Notes (Signed)
 Diagnosis: Sarcoidosis  Provider:  Mannam, Praveen MD  Procedure: IV Infusion  IV Type: Peripheral, IV Location: R Antecubital  Avsola  (infliximab -axxq), Dose: 200 mg  Infusion Start Time: 1044  Infusion Stop Time: 1308  Post Infusion IV Care: PIV Discontinued  Discharge: Condition: Good, Destination: Home . AVS Declined  Performed by:  Eleanor DELENA Bloch, RN

## 2024-05-05 LAB — COMPREHENSIVE METABOLIC PANEL WITH GFR
AG Ratio: 1.6 (calc) (ref 1.0–2.5)
ALT: 17 U/L (ref 6–29)
AST: 16 U/L (ref 10–35)
Albumin: 4.2 g/dL (ref 3.6–5.1)
Alkaline phosphatase (APISO): 53 U/L (ref 31–125)
BUN/Creatinine Ratio: 13 (calc) (ref 6–22)
BUN: 13 mg/dL (ref 7–25)
CO2: 21 mmol/L (ref 20–32)
Calcium: 9.1 mg/dL (ref 8.6–10.2)
Chloride: 107 mmol/L (ref 98–110)
Creat: 1 mg/dL — ABNORMAL HIGH (ref 0.50–0.99)
Globulin: 2.6 g/dL (ref 1.9–3.7)
Glucose, Bld: 87 mg/dL (ref 65–99)
Potassium: 4 mmol/L (ref 3.5–5.3)
Sodium: 138 mmol/L (ref 135–146)
Total Bilirubin: 0.9 mg/dL (ref 0.2–1.2)
Total Protein: 6.8 g/dL (ref 6.1–8.1)
eGFR: 71 mL/min/{1.73_m2} (ref >=60)

## 2024-05-05 LAB — CBC WITH DIFFERENTIAL/PLATELET
Absolute Lymphocytes: 604 {cells}/uL — ABNORMAL LOW (ref 850–3900)
Absolute Monocytes: 251 {cells}/uL (ref 200–950)
Basophils Absolute: 20 {cells}/uL (ref 0–200)
Basophils Relative: 0.6 %
Eosinophils Absolute: 40 {cells}/uL (ref 15–500)
Eosinophils Relative: 1.2 %
HCT: 37.7 % (ref 35.0–45.0)
Hemoglobin: 12.3 g/dL (ref 11.7–15.5)
MCH: 29.7 pg (ref 27.0–33.0)
MCHC: 32.6 g/dL (ref 32.0–36.0)
MCV: 91.1 fL (ref 80.0–100.0)
MPV: 10.8 fL (ref 7.5–12.5)
Monocytes Relative: 7.6 %
Neutro Abs: 2386 {cells}/uL (ref 1500–7800)
Neutrophils Relative %: 72.3 %
Platelets: 255 10*3/uL (ref 140–400)
RBC: 4.14 10*6/uL (ref 3.80–5.10)
RDW: 12.9 % (ref 11.0–15.0)
Total Lymphocyte: 18.3 %
WBC: 3.3 10*3/uL — ABNORMAL LOW (ref 3.8–10.8)

## 2024-05-20 ENCOUNTER — Encounter: Payer: Self-pay | Admitting: Pulmonary Disease

## 2024-05-22 ENCOUNTER — Ambulatory Visit: Payer: Self-pay | Admitting: Adult Health

## 2024-05-22 ENCOUNTER — Ambulatory Visit (INDEPENDENT_AMBULATORY_CARE_PROVIDER_SITE_OTHER): Payer: Medicare HMO

## 2024-05-22 DIAGNOSIS — I442 Atrioventricular block, complete: Secondary | ICD-10-CM | POA: Diagnosis not present

## 2024-05-25 LAB — CUP PACEART REMOTE DEVICE CHECK
Battery Remaining Longevity: 90 mo
Battery Remaining Percentage: 81 %
Battery Voltage: 2.99 V
Brady Statistic AP VP Percent: 1 %
Brady Statistic AP VS Percent: 8.7 %
Brady Statistic AS VP Percent: 1 %
Brady Statistic AS VS Percent: 90 %
Brady Statistic RA Percent Paced: 7.4 %
Brady Statistic RV Percent Paced: 1 %
Date Time Interrogation Session: 20250718020358
HighPow Impedance: 62 Ohm
Implantable Lead Connection Status: 753985
Implantable Lead Connection Status: 753985
Implantable Lead Implant Date: 20231018
Implantable Lead Implant Date: 20231018
Implantable Lead Location: 753859
Implantable Lead Location: 753860
Implantable Pulse Generator Implant Date: 20231018
Lead Channel Impedance Value: 350 Ohm
Lead Channel Impedance Value: 350 Ohm
Lead Channel Pacing Threshold Amplitude: 0.5 V
Lead Channel Pacing Threshold Amplitude: 0.875 V
Lead Channel Pacing Threshold Pulse Width: 0.5 ms
Lead Channel Pacing Threshold Pulse Width: 0.5 ms
Lead Channel Sensing Intrinsic Amplitude: 1.5 mV
Lead Channel Sensing Intrinsic Amplitude: 3 mV
Lead Channel Setting Pacing Amplitude: 1.125
Lead Channel Setting Pacing Amplitude: 1.5 V
Lead Channel Setting Pacing Pulse Width: 0.5 ms
Lead Channel Setting Sensing Sensitivity: 0.5 mV
Pulse Gen Serial Number: 211001606
Zone Setting Status: 755011

## 2024-05-30 ENCOUNTER — Ambulatory Visit: Payer: Self-pay | Admitting: Cardiovascular Disease

## 2024-06-18 DIAGNOSIS — J301 Allergic rhinitis due to pollen: Secondary | ICD-10-CM | POA: Diagnosis not present

## 2024-06-18 DIAGNOSIS — E1165 Type 2 diabetes mellitus with hyperglycemia: Secondary | ICD-10-CM | POA: Diagnosis not present

## 2024-06-18 DIAGNOSIS — E1142 Type 2 diabetes mellitus with diabetic polyneuropathy: Secondary | ICD-10-CM | POA: Diagnosis not present

## 2024-06-18 DIAGNOSIS — G43019 Migraine without aura, intractable, without status migrainosus: Secondary | ICD-10-CM | POA: Diagnosis not present

## 2024-06-18 DIAGNOSIS — E782 Mixed hyperlipidemia: Secondary | ICD-10-CM | POA: Diagnosis not present

## 2024-06-29 ENCOUNTER — Ambulatory Visit

## 2024-06-29 MED ORDER — ACETAMINOPHEN 325 MG PO TABS: 650.0000 mg | ORAL_TABLET | Freq: Once | ORAL | Status: DC

## 2024-06-29 MED ORDER — DIPHENHYDRAMINE HCL 25 MG PO CAPS: 25.0000 mg | ORAL_CAPSULE | Freq: Once | ORAL | Status: DC

## 2024-07-02 DIAGNOSIS — E782 Mixed hyperlipidemia: Secondary | ICD-10-CM | POA: Diagnosis not present

## 2024-07-02 DIAGNOSIS — E1165 Type 2 diabetes mellitus with hyperglycemia: Secondary | ICD-10-CM | POA: Diagnosis not present

## 2024-07-02 DIAGNOSIS — J301 Allergic rhinitis due to pollen: Secondary | ICD-10-CM | POA: Diagnosis not present

## 2024-07-02 DIAGNOSIS — E1142 Type 2 diabetes mellitus with diabetic polyneuropathy: Secondary | ICD-10-CM | POA: Diagnosis not present

## 2024-07-02 DIAGNOSIS — R7989 Other specified abnormal findings of blood chemistry: Secondary | ICD-10-CM | POA: Diagnosis not present

## 2024-07-02 DIAGNOSIS — G43019 Migraine without aura, intractable, without status migrainosus: Secondary | ICD-10-CM | POA: Diagnosis not present

## 2024-07-03 ENCOUNTER — Ambulatory Visit (INDEPENDENT_AMBULATORY_CARE_PROVIDER_SITE_OTHER)

## 2024-07-03 VITALS — BP 110/70 | HR 50 | Temp 98.1°F | Resp 12 | Ht 64.0 in | Wt 116.2 lb

## 2024-07-03 DIAGNOSIS — D869 Sarcoidosis, unspecified: Secondary | ICD-10-CM

## 2024-07-03 DIAGNOSIS — Z79899 Other long term (current) drug therapy: Secondary | ICD-10-CM | POA: Diagnosis not present

## 2024-07-03 LAB — CBC WITH DIFFERENTIAL/PLATELET
Absolute Lymphocytes: 782 {cells}/uL — ABNORMAL LOW (ref 850–3900)
Absolute Monocytes: 297 {cells}/uL (ref 200–950)
Basophils Absolute: 20 {cells}/uL (ref 0–200)
Basophils Relative: 0.6 %
Eosinophils Absolute: 89 {cells}/uL (ref 15–500)
Eosinophils Relative: 2.7 %
HCT: 39.8 % (ref 35.0–45.0)
Hemoglobin: 13.1 g/dL (ref 11.7–15.5)
MCH: 29.3 pg (ref 27.0–33.0)
MCHC: 32.9 g/dL (ref 32.0–36.0)
MCV: 89 fL (ref 80.0–100.0)
MPV: 11.3 fL (ref 7.5–12.5)
Monocytes Relative: 9 %
Neutro Abs: 2112 {cells}/uL (ref 1500–7800)
Neutrophils Relative %: 64 %
Platelets: 259 Thousand/uL (ref 140–400)
RBC: 4.47 Million/uL (ref 3.80–5.10)
RDW: 13.2 % (ref 11.0–15.0)
Total Lymphocyte: 23.7 %
WBC: 3.3 Thousand/uL — ABNORMAL LOW (ref 3.8–10.8)

## 2024-07-03 LAB — COMPREHENSIVE METABOLIC PANEL WITH GFR
AG Ratio: 1.8 (calc) (ref 1.0–2.5)
ALT: 19 U/L (ref 6–29)
AST: 28 U/L (ref 10–35)
Albumin: 4.4 g/dL (ref 3.6–5.1)
Alkaline phosphatase (APISO): 53 U/L (ref 31–125)
BUN: 16 mg/dL (ref 7–25)
CO2: 24 mmol/L (ref 20–32)
Calcium: 9 mg/dL (ref 8.6–10.2)
Chloride: 105 mmol/L (ref 98–110)
Creat: 0.92 mg/dL (ref 0.50–0.99)
Globulin: 2.5 g/dL (ref 1.9–3.7)
Glucose, Bld: 84 mg/dL (ref 65–99)
Potassium: 4.9 mmol/L (ref 3.5–5.3)
Sodium: 136 mmol/L (ref 135–146)
Total Bilirubin: 0.7 mg/dL (ref 0.2–1.2)
Total Protein: 6.9 g/dL (ref 6.1–8.1)
eGFR: 78 mL/min/1.73m2 (ref >=60)

## 2024-07-03 MED ORDER — ACETAMINOPHEN 325 MG PO TABS
650.0000 mg | ORAL_TABLET | Freq: Once | ORAL | Status: AC
  Administered 2024-07-03: 650 mg via ORAL
  Filled 2024-07-03: qty 2

## 2024-07-03 MED ORDER — SODIUM CHLORIDE 0.9 % IV SOLN
3.0000 mg/kg | Freq: Once | INTRAVENOUS | Status: AC
  Administered 2024-07-03: 200 mg via INTRAVENOUS
  Filled 2024-07-03: qty 20

## 2024-07-03 MED ORDER — DIPHENHYDRAMINE HCL 25 MG PO CAPS
25.0000 mg | ORAL_CAPSULE | Freq: Once | ORAL | Status: AC
  Administered 2024-07-03: 25 mg via ORAL
  Filled 2024-07-03: qty 1

## 2024-07-03 NOTE — Progress Notes (Signed)
 Diagnosis: Sarcoidosis  Provider:  Mannam, Praveen MD  Procedure: IV Infusion  IV Type: Peripheral, IV Location: R Antecubital  Avsola  (infliximab -axxq), Dose: 200 mg  Infusion Start Time: 1008  Infusion Stop Time: 1218  Post Infusion IV Care: Peripheral IV Discontinued  Discharge: Condition: Good, Destination: Home . AVS Provided  Performed by:  Rocky FORBES Sar, RN

## 2024-07-07 ENCOUNTER — Ambulatory Visit: Payer: Self-pay | Admitting: Pulmonary Disease

## 2024-07-08 ENCOUNTER — Encounter: Payer: Self-pay | Admitting: Physician Assistant

## 2024-07-16 ENCOUNTER — Ambulatory Visit (HOSPITAL_BASED_OUTPATIENT_CLINIC_OR_DEPARTMENT_OTHER): Admitting: Pulmonary Disease

## 2024-07-17 ENCOUNTER — Encounter (HOSPITAL_BASED_OUTPATIENT_CLINIC_OR_DEPARTMENT_OTHER): Payer: Self-pay | Admitting: Pulmonary Disease

## 2024-07-17 ENCOUNTER — Ambulatory Visit (HOSPITAL_BASED_OUTPATIENT_CLINIC_OR_DEPARTMENT_OTHER): Admitting: Pulmonary Disease

## 2024-07-17 VITALS — BP 107/66 | HR 65 | Ht 64.0 in | Wt 117.0 lb

## 2024-07-17 DIAGNOSIS — R053 Chronic cough: Secondary | ICD-10-CM

## 2024-07-17 DIAGNOSIS — D8685 Sarcoid myocarditis: Secondary | ICD-10-CM

## 2024-07-17 NOTE — Progress Notes (Signed)
 Subjective:   PATIENT ID: Vicki LOISE Nasuti GENDER: female DOB: 06/01/1981, MRN: [id number]  Chief Complaint  Patient presents with   Follow-up    Sarcoidosis    Reason for Visit: Follow-up  Vicki Mccoy is a 43 year old female with CHB s/p ICD 08/2022, DM2, GERD, migraines who presents for follow-up for sarcoid  Synopsis: She was admitted in October 2023 for syncope and diagnosed with CHB s/p ICD placement. Cardiac MRI demonstrated concerns for sarcoidosis She was started prednisone  taper 40 mg x 1 month followed by 30 mg daily.  2024 - Prednisone  20 mg daily. Started on methotexate 12/20/22. Skin itching with sun exposure related to methotrexate  otherwise tolerating methotrexate  15 mg weekly 2025 - Started on Infliximab  11/21/23 and methotrexate  10 mg weekly  07/17/24 Since our last visit she reports overall doing well. Will walk every other day for 30 min to 2 hours. Will feel sore the following times. Has coughing in the morning and evening. Sometimes occurs with drinking. Denies issues swallowing or choking on food. Denies shortness of breath wheezing. Denies chest pain.  Social History: Never smoker Daughter with cerebral palsy. Patient primary CP  Past Medical History:  Diagnosis Date   Allergy    Arthritis    Cardiac sarcoidosis    Complete heart block (HCC)    Diabetes mellitus    Sickle cell trait (HCC)    URI 09/20/2010     Family History  Problem Relation Age of Onset   Sickle cell anemia Mother    Multiple sclerosis Sister    Cerebral palsy Daughter    Allergic rhinitis Neg Hx    Angioedema Neg Hx    Asthma Neg Hx    Eczema Neg Hx    Urticaria Neg Hx    Immunodeficiency Neg Hx    Breast cancer Neg Hx      Social History   Occupational History   Occupation: Technical sales engineer: UNEMPLOYED    Comment: housekeeping  Tobacco Use   Smoking status: Never   Smokeless tobacco: Never  Vaping Use   Vaping status: Never Used  Substance and Sexual  Activity   Alcohol use: No    Alcohol/week: 0.0 standard drinks of alcohol   Drug use: No   Sexual activity: Not Currently    Partners: Male    Birth control/protection: Pill    Not on File   Outpatient Medications Prior to Visit  Medication Sig Dispense Refill   ACCU-CHEK GUIDE test strip USE TO CHECK BLOOD SUGAR 3 TIMES DAILY     acetaminophen  (TYLENOL ) 325 MG tablet Take 2 tablets (650 mg total) by mouth every 4 (four) hours as needed for headache or mild pain.     cetirizine (ZYRTEC) 10 MG tablet Take 10 mg by mouth daily.     fluticasone  (FLONASE ) 50 MCG/ACT nasal spray Place 1 spray into both nostrils daily.     folic acid  (FOLVITE ) 1 MG tablet Take 2 tablets (2 mg total) by mouth daily. 180 tablet 3   methotrexate  (RHEUMATREX) 2.5 MG tablet Take 4 tablets (10 mg total) by mouth once a week. ill provides refills at next appointment. 48 tablet 2   No facility-administered medications prior to visit.    Review of Systems  Constitutional:  Negative for chills, diaphoresis, fever, malaise/fatigue and weight loss.  HENT:  Negative for congestion.   Respiratory:  Positive for cough. Negative for hemoptysis, sputum production, shortness of breath and wheezing.   Cardiovascular:  Negative for chest pain, palpitations and leg swelling.     Objective:   Vitals:   07/17/24 0947  BP: 107/66  Pulse: 65  SpO2: 99%  Weight: 117 lb (53.1 kg)  Height: 5' 4 (1.626 m)    SpO2: 99 % Physical Exam: General: Well-appearing, no acute distress HENT: [place], AT Eyes: EOMI, no scleral icterus Respiratory: Clear to auscultation bilaterally.  No crackles, wheezing or rales Cardiovascular: RRR, -M/R/G, no JVD Extremities:-Edema,-tenderness Neuro: AAO x4, CNII-XII grossly intact Psych: Normal mood, normal affect   Data Reviewed:  Imaging: MR Cardiac 08/22/22 - Concerning for cardiac sarcoid CT Chest 08/23/22 - No mediastinal or hilar adenopathy. Scattered subcentimeter pulmonary  nodules with largest 6 mm in RUL CT Chest 01/16/23 - Minimal/mild scattered peribronchovascular nodularity. Stable nodules including RUL measuring 6 mm. Tiny bilateral pleural effusions.  PET/CT 10/02/23 -   FDG uptake findings are consistent with active myocardial inflammation/sarcoidosis in multiple segments. There appears to be a small area of fixed reduction in perfusion in the basal septum, probably due to postinflammatory scar.   FDG uptake was observed. FDG uptake was focal. FDG uptake was intense in the mid to basal septal segment(s). There is abnormal perfusion in the basal part of this area. There is also intense FDG uptake in the mid-apical anterior wall, with normal perfusion in that area. A smaller area of mild FDG uptake is seen in the basal lateral left ventricular wall. LV perfusion is abnormal. There is no evidence of ischemia. Defect 1: There is a small defect with moderate reduction in uptake present in the basal septal location(s). FDG uptake was present in the described defect segment(s). There is abnormal wall motion in the defect area.   Left ventricular function is normal. EF: 59%. End diastolic cavity size is normal. End systolic cavity size is normal. - Stable pulmonary findings  PFT: 09/20/15 FVC 2.52 (107%) FEV1 2.18 (99%) Ratio 86   Interpretation: Normal spirometry  Labs:    Latest Ref Rng & Units 07/03/2024    9:35 AM 05/04/2024   10:06 AM 01/15/2024   11:36 AM  CBC  WBC 3.8 - 10.8 Thousand/uL 3.3  3.3  5.7   Hemoglobin 11.7 - 15.5 g/dL 86.8  87.6  87.9   Hematocrit 35.0 - 45.0 % 39.8  37.7  37.4   Platelets 140 - 400 Thousand/uL 259  255  220   Mildly low WBC. Acceptable     Latest Ref Rng & Units 07/03/2024    9:35 AM 05/04/2024   10:06 AM 01/15/2024   11:36 AM  BMP  Glucose 65 - 99 mg/dL 84  87  899   BUN 7 - 25 mg/dL 16  13  10    Creatinine 0.50 - 0.99 mg/dL 9.07  8.99  8.93   BUN/Creat Ratio 6 - 22 (calc) SEE NOTE:  13  9   Sodium 135 - 146 mmol/L 136   138  141   Potassium 3.5 - 5.3 mmol/L 4.9  4.0  4.6   Chloride 98 - 110 mmol/L 105  107  108   CO2 20 - 32 mmol/L 24  21  23    Calcium 8.6 - 10.2 mg/dL 9.0  9.1  9.4   Normal electrolytes and kidney functions     Latest Ref Rng & Units 07/03/2024    9:35 AM 05/04/2024   10:06 AM 01/15/2024   11:36 AM  Hepatic Function  Total Protein 6.1 - 8.1 g/dL 6.9  6.8  6.3  Albumin 3.9 - 4.9 g/dL   4.3   AST 10 - 35 U/L 28  16  15    ALT 6 - 29 U/L 19  17  15    Alk Phosphatase 44 - 121 IU/L   61   Total Bilirubin 0.2 - 1.2 mg/dL 0.7  0.9  0.8   Normal LFTs        Assessment & Plan:   Discussion: 43 year old female with CHB s/p ICD 08/2022, DM2, GERD, migraines who presents for sarcoid follow-up. On immunosuppressants for active cardiac sarcoid.  We discussed the clinical course of sarcoid and management including serial PFTs, labs, eye exam, and EKG and chest imaging if indicated.   Uncontrolled on methotrexate . Immunosuppression plan discussed below. Infliximab  added in January 2025.   Cardiac sarcoidosis Complete heart block s/p ICD 08/2022 --Cardiac MRI 08/22/22 concerning for sarcoidosis --Prednisone  11/26/21 - self-discontinued  --CONTINUE methotrexate  10 mg weekly --Reviewed cardiac PET 10/02/23 with consistent active myocardial sarcoid. Pulmonary nodules are stable --ORDER cardiac PET   History of immunosuppression High risk medication management --Prednisone  08/23/22-current. Initially 40 mg --Prednisone  1/22/2-2/12/24 10 mg daily --Starting methotrexate  12/20/2022  --03/29/23 Reduced methotrexate  to 15 mg weekly d/t skin sensitivity --10/07/23-02/03/24 Restarted and wean off prednisone  taper --11/21/23 Infliximab  started and methotrexate  10 mg weekly in 01/2024 --CONTINUE methotrexate  10 mg weekly and folic acid  1 mg daily --CONTINUE Infliximab  per schedule --ORDER labs: with infusions  Sarcoid Monitoring --Recent chest imaging reviewed.  --Annual PFTs.  Last PFTs  09/2015 --Annual ophthalmology exam.  Last visit on 2023 --Recent EKG and TTE reviewed. 08/22/22 Normal except for CHB --Routine labs as needed: CBC with diff, CMET  Subcentimeter pulmonary nodules with largest 6 mm in RUL - stable --CT Chest 01/16/23 reviewed with stable nodules  Chronic cough - may be environmentally related, reflux --START omeprazole 20 mg once a day --If persistent, consider PFTs as next visit  Hx latent TB Treated x 6 months with health department at 43 years old. Contracted from exposure in the [place]  (reports Spanish speaking friend).    Health Maintenance Immunization History  Administered Date(s) Administered   Influenza Whole 10/06/2008, 11/01/2009, 09/20/2010   Td 06/20/2010   CT Lung Screen - not qualified. Insufficient tobacco history  Orders Placed This Encounter  Procedures   NM PET CT MYOCARDIAL SARCOIDOSIS    Schedule in December 2025    Standing Status:   Future    Expected Date:   10/05/2024    Expiration Date:   07/17/2025    Scheduling Instructions:     Schedule in December 2025    If indicated for the ordered procedure, I authorize the administration of a radiopharmaceutical per Radiology protocol:   Yes    Is the patient pregnant?:   No    Preferred Imaging Location:   [facility]   No orders of the defined types were placed in this encounter.  Return for December after PET/CT.  I have spent a total time of 41-minutes on the day of the appointment including chart review, data review, collecting history, coordinating care and discussing medical diagnosis and plan with the patient/family. Past medical history, allergies, medications were reviewed. Pertinent imaging, labs and tests included in this note have been reviewed and interpreted independently by me.  Coulton Schlink Slater Staff, MD [facility] 07/17/2024 10:11 AM

## 2024-07-17 NOTE — Patient Instructions (Addendum)
 History of immunosuppression --ORDER cardiac PET  --CONTINUE methotrexate  10 mg weekly and folic acid  1 mg daily --CONTINUE Infliximab  per schedule

## 2024-07-21 DIAGNOSIS — E1142 Type 2 diabetes mellitus with diabetic polyneuropathy: Secondary | ICD-10-CM | POA: Diagnosis not present

## 2024-07-21 DIAGNOSIS — E782 Mixed hyperlipidemia: Secondary | ICD-10-CM | POA: Diagnosis not present

## 2024-07-21 DIAGNOSIS — J301 Allergic rhinitis due to pollen: Secondary | ICD-10-CM | POA: Diagnosis not present

## 2024-07-21 DIAGNOSIS — G43019 Migraine without aura, intractable, without status migrainosus: Secondary | ICD-10-CM | POA: Diagnosis not present

## 2024-07-21 DIAGNOSIS — R7989 Other specified abnormal findings of blood chemistry: Secondary | ICD-10-CM | POA: Diagnosis not present

## 2024-07-31 NOTE — Progress Notes (Deleted)
 NEUROLOGY FOLLOW UP OFFICE NOTE  Vicki Mccoy [id number]  Assessment/Plan:   Menstrual migraine, stable   Migraine prevention:  Deferred.   Migraine rescue:  Provided samples of Nurtec to try.  On first day has a migraine, will take a Nurtec once daily for 3 days.  Due to recent heart block, would like to avoid triptans.   Limit use of pain relievers to no more than 2 days out of week to prevent risk of rebound or medication-overuse headache. Keep headache diary Follow up 6 months .       Subjective:  Vicki Mccoy is a 43 year old female with sarcoidosis with cardiac involvement (heart block s/p ICD), DM 2 with polyneuropathy, arthritis and Sickle cell trait who follows up for migraines.   UPDATE: Intensity:  10/10 Duration:  30 minutes with [place] but returns after 6-7 hours for 3 consecutive days Frequency:  ***   Current NSAIDS/analgesics:  Tylenol  Current triptans: Would avoid due to heart block Current ergotamine:  none Current anti-emetic:  none Current muscle relaxants:  none Current Antihypertensive medications:  none Current Antidepressant medications:  none Current Anticonvulsant medications: none Current anti-CGRP:  Ubrelvy 100mg  Current Vitamins/Herbal/Supplements:  D Current Antihistamines/Decongestants:  none Other therapy:  none     Caffeine:  coffee occasionally.  Occasional soda Alcohol:  no Smoker:  no Diet:  5-6 bottles water daily.  Rarely soda.   Exercise:  walks Depression:  no; Anxiety:  no Other pain:  no Sleep hygiene:  good   HISTORY:  Migraines: Onset:  Menstrual migraines as a teenager  They subsequently resolved but returned about 2 months ago.   Location:  left temple radiating into eye Quality:  throbbing Intensity:  10/10 Aura:  absent Prodrome:  absent Associated symptoms:  Photophobia, sometimes blurred vision.  She denies associated nausea, vomiting, phonophobia, osmophobia, autonomic symptoms, unilateral numbness or  weakness. Duration:  3-4 days.  Aborts quickly with [facility] but pain will returns 8-9 hours later Frequency:  Occurs with her cycle.  Starts one day before menses Triggers:  Menses Relieving factors:  BC powder Activity:  aggravates  Probable PLMS: Sometimes her legs jerk in her sleep and it wakes her up.  It doesn't occur frequently.       Past NSAIDS/analgesics:  naproxen, ibuprofen, Tylenol , Excedrin igraine Past abortive triptans:  sumatriptan tab, zolmitriptan  (dizziness) Past abortive ergotamine:  none Past muscle relaxants:  cyclobenzaprine  Past anti-emetic:  none Past antihypertensive medications:  furosemide Past antidepressant medications:  none Past anticonvulsant medications:  topiramate  (blurred vision, fatigue), gabapentin Past anti-CGRP:  Zavzpret Past vitamins/Herbal/Supplements:  none Past antihistamines/decongestants:  meclizine , Zyrtec, Flonase  Other past therapies:  none   PAST MEDICAL HISTORY: Past Medical History:  Diagnosis Date   Allergy    Arthritis    Cardiac sarcoidosis    Complete heart block (HCC)    Diabetes mellitus    Sickle cell trait    URI 09/20/2010    MEDICATIONS: Current Outpatient Medications on File Prior to Visit  Medication Sig Dispense Refill   ACCU-CHEK GUIDE test strip USE TO CHECK BLOOD SUGAR 3 TIMES DAILY     acetaminophen  (TYLENOL ) 325 MG tablet Take 2 tablets (650 mg total) by mouth every 4 (four) hours as needed for headache or mild pain.     cetirizine (ZYRTEC) 10 MG tablet Take 10 mg by mouth daily.     fluticasone  (FLONASE ) 50 MCG/ACT nasal spray Place 1 spray into both nostrils daily.     folic  acid (FOLVITE ) 1 MG tablet Take 2 tablets (2 mg total) by mouth daily. 180 tablet 3   methotrexate  (RHEUMATREX) 2.5 MG tablet Take 4 tablets (10 mg total) by mouth once a week. ill provides refills at next appointment. 48 tablet 2   No current facility-administered medications on file prior to visit.    ALLERGIES: Not on  File  FAMILY HISTORY: Family History  Problem Relation Age of Onset   Sickle cell anemia Mother    Multiple sclerosis Sister    Cerebral palsy Daughter    Allergic rhinitis Neg Hx    Angioedema Neg Hx    Asthma Neg Hx    Eczema Neg Hx    Urticaria Neg Hx    Immunodeficiency Neg Hx    Breast cancer Neg Hx       Objective:  *** General: No acute distress.  Patient appears well-groomed.   Head:  Normocephalic/atraumatic Neck:  Supple.  No paraspinal tenderness.  Full range of motion. Heart:  Regular rate and rhythm. Neuro:  Alert and oriented.  Speech fluent and not dysarthric.  Language intact.  CN II-XII intact.  Bulk and tone normal.  Muscle strength 5/5 throughout.  Sensation to light touch intact.  Deep tendon reflexes 2+ throughout, toes downgoing.  Gait normal.  Romberg negative.    Juliene Dunnings, DO  CC: Rosina Amy, PA

## 2024-08-03 ENCOUNTER — Ambulatory Visit: Admitting: Neurology

## 2024-08-10 NOTE — Progress Notes (Signed)
 Remote ICD Transmission

## 2024-08-11 ENCOUNTER — Ambulatory Visit (INDEPENDENT_AMBULATORY_CARE_PROVIDER_SITE_OTHER): Admitting: Physician Assistant

## 2024-08-11 ENCOUNTER — Other Ambulatory Visit (INDEPENDENT_AMBULATORY_CARE_PROVIDER_SITE_OTHER)

## 2024-08-11 ENCOUNTER — Encounter: Payer: Self-pay | Admitting: Physician Assistant

## 2024-08-11 DIAGNOSIS — M25552 Pain in left hip: Secondary | ICD-10-CM

## 2024-08-11 NOTE — Progress Notes (Signed)
 Office Visit Note   Patient: Vicki Mccoy           Date of Birth: [birth year]           MRN: [id number] Visit Date: 08/11/2024              Requested by: Rosalea Rosina LOISE, PA [address] [place],  [place] [address] PCP: Rosalea Rosina LOISE, PA   Assessment & Plan: Visit Diagnoses:  1. Pain of left hip     Plan: Severe osteoarthritis left hip.  Patient does not have a family history or a personal history of inflammatory arthropathy but will get inflammatory labs.  Does have by history sickle cell trait.  This has now been going on for several months to the point where she has difficulty walking and bending her hip.  I reviewed the x-rays and exam with Dr. Jerri.  Really an injection probably would not give her much relief nor would be long-lasting.  Patient has a history of her mother having a hip replacement when she was in her 58s and a daughter having a hip replacement when she was young she is not surprised that this is the diagnosis.  She is willing to see with Dr. Jerri as the alternative may to get her out of pain would more than likely be hip replacement  Follow-Up Instructions: 1 week with Dr. Jerri  Orders:  Orders Placed This Encounter  Procedures   XR HIP UNILAT W OR W/O PELVIS 1V LEFT   Uric acid   Antinuclear Antib (ANA)   Sed Rate (ESR)   Rheumatoid Factor   No orders of the defined types were placed in this encounter.     Procedures: No procedures performed   Clinical Data: No additional findings.   Subjective: Chief Complaint  Patient presents with   Left Hip - Pain    HPI patient is a 43 year old woman with left hip pain for about a week or more.  No actual injuries she says bending stooping and stretching is painful.  It is over the lateral side of the hip  Review of Systems  All other systems reviewed and are negative.    Objective: Vital Signs: There were no vitals taken for this visit.  Physical Exam Constitutional:      Appearance:  Normal appearance.  Pulmonary:     Effort: Pulmonary effort is normal.  Skin:    General: Skin is warm and dry.  Neurological:     General: No focal deficit present.     Mental Status: She is alert and oriented to person, place, and time.     Ortho Exam Examination she has an antalgic gait.  She has limited movement of her hip.  Pain in her groin.  She is neurovascularly intact compartments are soft and nontender Specialty Comments:  No specialty comments available.  Imaging: XR HIP UNILAT W OR W/O PELVIS 1V LEFT Result Date: 08/11/2024 Radiographs of her left hip demonstrate advanced degenerative changes consistent with severe arthritis of her left hip.  No acute fractures noted    PMFS History: Patient Active Problem List   Diagnosis Date Noted   Tinnitus of both ears 12/07/2023   Sarcoidosis 10/07/2023   High risk medication use 10/07/2023   Complete heart block (HCC) 08/21/2022   CHB (complete heart block) (HCC) 08/21/2022   Foreign body of left ear 09/09/2018   Coughing 09/19/2015   Dyspareunia, female 04/18/2012   Acute upper respiratory infection 09/20/2010  Backache 05/09/2010   CANDIDIASIS OF VULVA AND VAGINA 01/27/2010   FINGER PAIN 12/08/2009   OBESITY 11/01/2009   VAGINAL PRURITUS 11/01/2009   DRY SKIN 11/01/2009   DIABETES MELLITUS 08/02/2009   SORE THROAT 12/22/2008   DYSURIA 02/02/2008   Allergic rhinitis 07/08/2007   GERD 07/08/2007   Vaginitis and vulvovaginitis 07/08/2007   Past Medical History:  Diagnosis Date   Allergy    Arthritis    Cardiac sarcoidosis    Complete heart block (HCC)    Diabetes mellitus    Sickle cell trait    URI 09/20/2010    Family History  Problem Relation Age of Onset   Sickle cell anemia Mother    Multiple sclerosis Sister    Cerebral palsy Daughter    Allergic rhinitis Neg Hx    Angioedema Neg Hx    Asthma Neg Hx    Eczema Neg Hx    Urticaria Neg Hx    Immunodeficiency Neg Hx    Breast cancer Neg Hx      Past Surgical History:  Procedure Laterality Date   ICD IMPLANT N/A 08/22/2022   Procedure: ICD IMPLANT;  Surgeon: Nancey Eulas BRAVO, MD;  Location: [facility];  Service: Cardiovascular;  Laterality: N/A;   Social History   Occupational History   Occupation: Technical sales engineer: UNEMPLOYED    Comment: housekeeping  Tobacco Use   Smoking status: Never   Smokeless tobacco: Never  Vaping Use   Vaping status: Never Used  Substance and Sexual Activity   Alcohol use: No    Alcohol/week: 0.0 standard drinks of alcohol   Drug use: No   Sexual activity: Not Currently    Partners: Male    Birth control/protection: Pill

## 2024-08-13 LAB — SEDIMENTATION RATE: Sed Rate: 6 mm/h (ref 0–20)

## 2024-08-13 LAB — ANTI-NUCLEAR AB-TITER (ANA TITER): ANA Titer 1: 1:40 {titer} — ABNORMAL HIGH

## 2024-08-13 LAB — RHEUMATOID FACTOR: Rheumatoid fact SerPl-aCnc: <10 [IU]/mL (ref <14)

## 2024-08-13 LAB — URIC ACID: Uric Acid, Serum: 5.1 mg/dL (ref 2.5–7.0)

## 2024-08-13 LAB — ANA: Anti Nuclear Antibody (ANA): POSITIVE — AB

## 2024-08-20 ENCOUNTER — Ambulatory Visit: Admitting: Orthopaedic Surgery

## 2024-08-20 DIAGNOSIS — M06052 Rheumatoid arthritis without rheumatoid factor, left hip: Secondary | ICD-10-CM | POA: Diagnosis not present

## 2024-08-20 DIAGNOSIS — M1612 Unilateral primary osteoarthritis, left hip: Secondary | ICD-10-CM | POA: Diagnosis not present

## 2024-08-20 LAB — POCT GLYCOSYLATED HEMOGLOBIN (HGB A1C): Hemoglobin A1C: 5.3 % (ref 4.0–5.6)

## 2024-08-20 NOTE — Progress Notes (Signed)
 Office Visit Note   Patient: Vicki Mccoy           Date of Birth: 08-30-1981           MRN: [id number] Visit Date: 08/20/2024              Requested by: Rosalea Rosina LOISE, PA [address] [place],  [place] [address] PCP: Rosalea Rosina LOISE, PA   Assessment & Plan: Visit Diagnoses:  1. Rheumatoid arthritis involving left hip with negative rheumatoid factor (HCC)   2. Primary osteoarthritis of left hip     Plan: History of Present Illness Vicki Mccoy is a 43 year old female with rheumatoid arthritis who presents with left hip pain and limited mobility. She was referred by Ronal Dragon for surgical consultation for THA.  She experiences left hip pain that worsens with exercise, cold weather, and rain. A limp develops after physical activity, and there is a noticeable change in her gait. Her left leg is shorter than the right, and she experiences pain with hip rotation. There is no pain on the lateral aspect of the hip upon palpation. She is currently on methotrexate  for rheumatoid arthritis.  Physical Exam MUSCULOSKELETAL: Left leg shorter than right. Left hip with limited rotation and flexion. No tenderness on palpation of left hip.  Antalgic gait.  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  Early intervention required despite her younger age to prevent progression. - Discussed surgical risks including infection and blood clots, mitigated with postoperative blood thinners. - Order CT scan of the pelvis for surgical planning. - Obtain cardiology clearance from Dr. Nancey. - Perform finger stick glucose test.  Impression is severe left hip degenerative joint disease secondary to rheumatoid arthritis.  Patient has attempted conservative treatment for at least 6 consecutive weeks within the past 12 weeks, including but not limited to physical therapy, home exercise  program, NSAIDs, activity modification, and/or corticosteroid injections. Despite these efforts, symptoms have not improved or have worsened. Conservative measures have been deemed unsuccessful at this time. After a detailed discussion covering diagnosis and treatment options--including the risks, benefits, alternatives, and potential complications of surgical and nonsurgical management--the patient elected to proceed with surgery.  Current anticoagulants: No antithrombotic Postop anticoagulation: Aspirin 81 mg Diabetic: No  Prior DVT/PE: No Tobacco use: No Clearances needed for surgery: PCP, cardiology Anticipate discharge dispo: 23 hr obs, potential for outpatient status   Follow-Up Instructions: No follow-ups on file.   Orders:  Orders Placed This Encounter  Procedures   CT HIP LEFT WO CONTRAST   POCT HgB A1C   No orders of the defined types were placed in this encounter.    Subjective: Chief Complaint  Patient presents with   Left Hip - Pain    HPI  Review of Systems  Constitutional: Negative.   HENT: Negative.    Eyes: Negative.   Respiratory: Negative.    Cardiovascular: Negative.   Endocrine: Negative.   Musculoskeletal: Negative.   Neurological: Negative.   Hematological: Negative.   Psychiatric/Behavioral: Negative.    All other systems reviewed and are negative.    Objective: Vital Signs: There were no vitals taken for this visit.  Physical Exam Vitals and nursing note reviewed.  Constitutional:      Appearance: She is well-developed.  HENT:     Head: Atraumatic.     Nose: Nose normal.  Eyes:  Extraocular Movements: Extraocular movements intact.  Cardiovascular:     Pulses: Normal pulses.  Pulmonary:     Effort: Pulmonary effort is normal.  Abdominal:     Palpations: Abdomen is soft.  Musculoskeletal:     Cervical back: Neck supple.  Skin:    General: Skin is warm.     Capillary Refill: Capillary refill takes less than 2 seconds.   Neurological:     Mental Status: She is alert. Mental status is at baseline.  Psychiatric:        Behavior: Behavior normal.        Thought Content: Thought content normal.        Judgment: Judgment normal.      PMFS History: Patient Active Problem List   Diagnosis Date Noted   Primary osteoarthritis of left hip 08/20/2024   Rheumatoid arthritis involving left hip with negative rheumatoid factor (HCC) 08/20/2024   Tinnitus of both ears 12/07/2023   Sarcoidosis 10/07/2023   High risk medication use 10/07/2023   Complete heart block (HCC) 08/21/2022   CHB (complete heart block) (HCC) 08/21/2022   Foreign body of left ear 09/09/2018   Coughing 09/19/2015   Dyspareunia, female 04/18/2012   Acute upper respiratory infection 09/20/2010   Backache 05/09/2010   CANDIDIASIS OF VULVA AND VAGINA 01/27/2010   FINGER PAIN 12/08/2009   OBESITY 11/01/2009   VAGINAL PRURITUS 11/01/2009   DRY SKIN 11/01/2009   DIABETES MELLITUS 08/02/2009   SORE THROAT 12/22/2008   DYSURIA 02/02/2008   Allergic rhinitis 07/08/2007   GERD 07/08/2007   Vaginitis and vulvovaginitis 07/08/2007   Past Medical History:  Diagnosis Date   Allergy    Arthritis    Cardiac sarcoidosis    Complete heart block (HCC)    Diabetes mellitus    Sickle cell trait    URI 09/20/2010    Family History  Problem Relation Age of Onset   Sickle cell anemia Mother    Multiple sclerosis Sister    Cerebral palsy Daughter    Allergic rhinitis Neg Hx    Angioedema Neg Hx    Asthma Neg Hx    Eczema Neg Hx    Urticaria Neg Hx    Immunodeficiency Neg Hx    Breast cancer Neg Hx     Past Surgical History:  Procedure Laterality Date   ICD IMPLANT N/A 08/22/2022   Procedure: ICD IMPLANT;  Surgeon: Nancey Eulas BRAVO, MD;  Location: [facility];  Service: Cardiovascular;  Laterality: N/A;   Social History   Occupational History   Occupation: Technical sales engineer: UNEMPLOYED    Comment: housekeeping  Tobacco  Use   Smoking status: Never   Smokeless tobacco: Never  Vaping Use   Vaping status: Never Used  Substance and Sexual Activity   Alcohol use: No    Alcohol/week: 0.0 standard drinks of alcohol   Drug use: No   Sexual activity: Not Currently    Partners: Male    Birth control/protection: Pill

## 2024-08-21 ENCOUNTER — Ambulatory Visit (INDEPENDENT_AMBULATORY_CARE_PROVIDER_SITE_OTHER): Payer: Medicare HMO

## 2024-08-21 ENCOUNTER — Other Ambulatory Visit (HOSPITAL_COMMUNITY)
Admission: RE | Admit: 2024-08-21 | Discharge: 2024-08-21 | Disposition: A | Discharge status: Home / Self Care | Source: Ambulatory Visit | Attending: Physician Assistant | Admitting: Physician Assistant

## 2024-08-21 ENCOUNTER — Encounter: Payer: Self-pay | Admitting: Physician Assistant

## 2024-08-21 ENCOUNTER — Ambulatory Visit (INDEPENDENT_AMBULATORY_CARE_PROVIDER_SITE_OTHER): Admitting: Physician Assistant

## 2024-08-21 VITALS — BP 108/62 | HR 64 | Ht 64.0 in | Wt 118.2 lb

## 2024-08-21 DIAGNOSIS — I442 Atrioventricular block, complete: Secondary | ICD-10-CM

## 2024-08-21 DIAGNOSIS — Z1231 Encounter for screening mammogram for malignant neoplasm of breast: Secondary | ICD-10-CM | POA: Diagnosis not present

## 2024-08-21 DIAGNOSIS — Z1211 Encounter for screening for malignant neoplasm of colon: Secondary | ICD-10-CM

## 2024-08-21 DIAGNOSIS — Z1151 Encounter for screening for human papillomavirus (HPV): Secondary | ICD-10-CM | POA: Insufficient documentation

## 2024-08-21 DIAGNOSIS — Z01419 Encounter for gynecological examination (general) (routine) without abnormal findings: Secondary | ICD-10-CM

## 2024-08-21 DIAGNOSIS — Z124 Encounter for screening for malignant neoplasm of cervix: Secondary | ICD-10-CM | POA: Diagnosis not present

## 2024-08-21 NOTE — Progress Notes (Signed)
 ANNUAL EXAM Patient name: Vicki Mccoy MRN [id number]  Date of birth: 1981/01/07 Chief Complaint:   No chief complaint on file.  History of Present Illness:   Vicki Mccoy is a 43 y.o. G52P1101 female being seen today for a routine annual exam.   Current complaints: None  No LMP recorded. (Menstrual status: Irregular Periods).   The pregnancy intention screening data noted above was reviewed. Potential methods of contraception were discussed. The patient elected to proceed with No data recorded.   Last pap 02/22/24. Results were: NILM without HPV testing. H/O abnormal pap: no Last mammogram: 01/21/24. Results were: normal. Family h/o breast cancer: no Last colonoscopy: has been scheduled. Results were: N/A. Family h/o colorectal cancer: no STI screening: politely declines Contraception: sexually inactive     02/28/2024    9:47 AM 01/02/2024    9:50 AM 12/05/2023    9:59 AM 11/21/2023   11:01 AM 05/07/2016   11:10 AM  Depression screen PHQ 2/9  Decreased Interest 0 0 0 0 0  Down, Depressed, Hopeless 0 0 0 0 0  PHQ - 2 Score 0 0 0 0 0         No data to display           Review of Systems:   Pertinent items are noted in HPI Denies any headaches, blurred vision, fatigue, shortness of breath, chest pain, abdominal pain, abnormal vaginal discharge/itching/odor/irritation, problems with periods, bowel movements, urination, or intercourse unless otherwise stated above. Pertinent History Reviewed:  Reviewed past medical,surgical, social and family history.  Reviewed problem list, medications and allergies. Physical Assessment:   Vitals:   08/21/24 1113  BP: 108/62  Pulse: 64  Weight: 118 lb 3.2 oz (53.6 kg)  Height: 5' 4 (1.626 m)  Body mass index is 20.29 kg/m.        Physical Examination:   General appearance - well appearing, and in no distress  Mental status - alert, oriented to person, place, and time  Psych:  She has a normal mood and affect  Skin - warm and  dry, normal color, no suspicious lesions noted  Chest - effort normal, all lung fields clear to auscultation bilaterally  Heart - normal rate and regular rhythm  Neck:  midline trachea, no thyromegaly or nodules  Breasts - breasts appear normal, no suspicious masses, no skin or nipple changes or  axillary nodes  Abdomen - soft, nontender, nondistended, no masses or organomegaly  Pelvic - VULVA: normal appearing vulva with no masses, tenderness or lesions  VAGINA: normal appearing vagina with normal color and discharge, no lesions  CERVIX: normal appearing cervix without discharge or lesions, no CMT  Thin prep pap is done with HR HPV cotesting  UTERUS: uterus is felt to be normal size, shape, consistency and nontender   ADNEXA: No adnexal masses or tenderness noted.  Extremities:  No swelling or varicosities noted  Chaperone present for exam  No results found for this or any previous visit (from the past 24 hours).  Assessment & Plan:  1. Encounter for annual routine gynecological examination (Primary) - Cervical cancer screening: Discussed guidelines. Pap with HPV updated today - STD Testing: politely declines - Birth Control:none - Breast Health: Encouraged self breast awareness/SBE. Teaching provided. Discussed limits of clinical breast exam for detecting breast cancer. MXR is up to date: 01/21/24 - F/U 12 months and prn  - Cytology - PAP  2. Cervical cancer screening  3. Breast cancer screening by mammogram  4. Screening for malignant neoplasm of colon   No orders of the defined types were placed in this encounter.  Meds: No orders of the defined types were placed in this encounter.  Follow-up: Return in about 1 year (around 08/21/2025) for ANN.  Maddie Brazier E Chantee Cerino, [place] 08/21/2024 9:21 PM

## 2024-08-21 NOTE — Progress Notes (Signed)
 Pt presents for AEX Last PAP 02/2016 Declines BC and STD testing  Pt c/o irregular periods  Mammogram 01/2024 Colonoscopy scheduled

## 2024-08-25 ENCOUNTER — Telehealth: Payer: Self-pay | Admitting: Orthopaedic Surgery

## 2024-08-25 LAB — CUP PACEART REMOTE DEVICE CHECK
Battery Remaining Longevity: 87 mo
Battery Remaining Percentage: 79 %
Battery Voltage: 2.99 V
Brady Statistic AP VP Percent: 1 %
Brady Statistic AP VS Percent: 11 %
Brady Statistic AS VP Percent: 1 %
Brady Statistic AS VS Percent: 89 %
Brady Statistic RA Percent Paced: 9.4 %
Brady Statistic RV Percent Paced: 1 %
Date Time Interrogation Session: 20251017020644
HighPow Impedance: 55 Ohm
Implantable Lead Connection Status: 753985
Implantable Lead Connection Status: 753985
Implantable Lead Implant Date: 20231018
Implantable Lead Implant Date: 20231018
Implantable Lead Location: 753859
Implantable Lead Location: 753860
Implantable Pulse Generator Implant Date: 20231018
Lead Channel Impedance Value: 330 Ohm
Lead Channel Impedance Value: 340 Ohm
Lead Channel Pacing Threshold Amplitude: 0.5 V
Lead Channel Pacing Threshold Amplitude: 1 V
Lead Channel Pacing Threshold Pulse Width: 0.5 ms
Lead Channel Pacing Threshold Pulse Width: 0.5 ms
Lead Channel Sensing Intrinsic Amplitude: 2.1 mV
Lead Channel Sensing Intrinsic Amplitude: 2.9 mV
Lead Channel Setting Pacing Amplitude: 1.25 V
Lead Channel Setting Pacing Amplitude: 1.5 V
Lead Channel Setting Pacing Pulse Width: 0.5 ms
Lead Channel Setting Sensing Sensitivity: 0.5 mV
Pulse Gen Serial Number: 211001606
Zone Setting Status: 755011

## 2024-08-25 LAB — CYTOLOGY - PAP
Comment: NEGATIVE
Diagnosis: NEGATIVE
High risk HPV: NEGATIVE

## 2024-08-25 NOTE — Telephone Encounter (Signed)
 Called patient to offer surgery dates for left total hip arthroplasty with Dr. Jerri.  No answer.  Left message on patient's voicemail providing name and direct number to call when ready to schedule.

## 2024-08-26 ENCOUNTER — Telehealth: Payer: Self-pay

## 2024-08-26 ENCOUNTER — Telehealth (HOSPITAL_BASED_OUTPATIENT_CLINIC_OR_DEPARTMENT_OTHER): Payer: Self-pay | Admitting: *Deleted

## 2024-08-26 NOTE — Telephone Encounter (Signed)
   Name: Vicki Mccoy  DOB: Mar 24, 1981  MRN: [id number]  Primary Cardiologist: Jeffrie EP: Meal[place]   Preoperative team, please contact this patient and set up a phone call appointment for further preoperative risk assessment. Please obtain consent and complete medication review. Thank you for your help.  I confirm that guidance regarding antiplatelet and oral anticoagulation therapy has been completed and, if necessary, noted below.  Patient is not on anticoagulation or antiplatelet per review of current medical record in Epic.    I also confirmed the patient resides in the state of [place] . As per [facility] Medical Board telemedicine laws, the patient must reside in the state in which the provider is licensed.   Lamarr Satterfield, NP 08/26/2024, 1:55 PM [facility]

## 2024-08-26 NOTE — Telephone Encounter (Signed)
 Appointment scheduled for 10/06/2024 at  2:40pm. Med req and consent are complete. Call patient at [phone].

## 2024-08-26 NOTE — Telephone Encounter (Signed)
   Pre-operative Risk Assessment    Patient Name: Vicki Mccoy  DOB: 1981-07-02 MRN: [id number]   Date of last office visit: 01/03/2024 Date of next office visit: None   Request for Surgical Clearance    Procedure:  Left Total Hip Arthroplasty  Date of Surgery:  Clearance 10/16/24                                 Surgeon:  Dr. Ozell Cummins Surgeon's Group or Practice Name:  [facility] Phone number:  [phone] Fax number:  [phone]   Type of Clearance Requested:   - Medical    Type of Anesthesia:  Spinal   Additional requests/questions:    Signed, Edsel Grayce Sanders   08/26/2024, 1:23 PM

## 2024-08-26 NOTE — Telephone Encounter (Signed)
  Patient Consent for Virtual Visit         Vicki Mccoy has provided verbal consent on 08/26/2024 for a virtual visit (video or telephone). Appointment scheduled for 10/06/2024 at  2:40pm. Med req and consent are complete. Call patient at [phone].    CONSENT FOR VIRTUAL VISIT FOR:  Vicki Mccoy  By participating in this virtual visit I agree to the following:  I hereby voluntarily request, consent and authorize [facility] and its employed or contracted physicians, physician assistants, nurse practitioners or other licensed health care professionals (the Practitioner), to provide me with telemedicine health care services (the "Services) as deemed necessary by the treating Practitioner. I acknowledge and consent to receive the Services by the Practitioner via telemedicine. I understand that the telemedicine visit will involve communicating with the Practitioner through live audiovisual communication technology and the disclosure of certain medical information by electronic transmission. I acknowledge that I have been given the opportunity to request an in-person assessment or other available alternative prior to the telemedicine visit and am voluntarily participating in the telemedicine visit.  I understand that I have the right to withhold or withdraw my consent to the use of telemedicine in the course of my care at any time, without affecting my right to future care or treatment, and that the Practitioner or I may terminate the telemedicine visit at any time. I understand that I have the right to inspect all information obtained and/or recorded in the course of the telemedicine visit and may receive copies of available information for a reasonable fee.  I understand that some of the potential risks of receiving the Services via telemedicine include:  Delay or interruption in medical evaluation due to technological equipment failure or disruption; Information transmitted may not  be sufficient (e.g. poor resolution of images) to allow for appropriate medical decision making by the Practitioner; and/or  In rare instances, security protocols could fail, causing a breach of personal health information.  Furthermore, I acknowledge that it is my responsibility to provide information about my medical history, conditions and care that is complete and accurate to the best of my ability. I acknowledge that Practitioner's advice, recommendations, and/or decision may be based on factors not within their control, such as incomplete or inaccurate data provided by me or distortions of diagnostic images or specimens that may result from electronic transmissions. I understand that the practice of medicine is not an exact science and that Practitioner makes no warranties or guarantees regarding treatment outcomes. I acknowledge that a copy of this consent can be made available to me via my patient portal [facility] MyChart), or I can request a printed copy by calling the office of [facility].    I understand that my insurance will be billed for this visit.   I have read or had this consent read to me. I understand the contents of this consent, which adequately explains the benefits and risks of the Services being provided via telemedicine.  I have been provided ample opportunity to ask questions regarding this consent and the Services and have had my questions answered to my satisfaction. I give my informed consent for the services to be provided through the use of telemedicine in my medical care

## 2024-08-27 ENCOUNTER — Ambulatory Visit: Payer: Self-pay | Admitting: Physician Assistant

## 2024-08-27 NOTE — Progress Notes (Unsigned)
 Vicki Console, PA-C [address] [place], [place]  [address] Phone: [phone]   Gastroenterology Consultation  Referring Provider:     Rosalea Rosina SAILOR, [place] Primary Care Physician:  Rosalea Rosina SAILOR, [place] Primary Gastroenterologist:  Vicki Console, PA-C / Elspeth Naval, MD  Reason for Consultation:     Discuss colonoscopy        HPI:   Discussed the use of AI scribe software for clinical note transcription with the patient, who gave verbal consent to proceed. History of Present Illness Vicki Mccoy is a 43 year old female who presents to schedule her first screening colonoscopy.  She has never had a colonoscopy.  She has a history of cardiac sarcoidosis and is followed by both a cardiologist and a pulmonologist. She has an internal defibrillator in place.  Currently doing well with no cardiac or pulmonary symptoms.  She experiences occasional constipation and manages it with enzymes and magnesium, which she takes either in the morning or at night. She has bowel movements every day or every two to three days. She has tried Miralax in the past, but it caused gas and was ineffective. She does not consume a high-fiber diet, rarely eats fruits and vegetables, and sometimes eats a banana. No blood in stool, heartburn, or trouble swallowing.  There is no known family history of colon cancer. She is planning to have a hip replacement in December and is trying to complete all necessary procedures before then.  PMH: Cardiac sarcoidosis, complete heart block s/p internal cardiac defibrillator placed 08/2022, allergies, GERD, diabetes, OA, Rheumatoid arthritis, obesity.  03/2023 echo LVEF 50 to 55%.  Cardiologist Dr. Nancey and Dr. Jeffrie.  Followed by pulmonology.    Past Medical History:  Diagnosis Date   Allergy    Arthritis    Cardiac sarcoidosis    Complete heart block (HCC)    Diabetes mellitus    Sickle cell trait    URI 09/20/2010    Past Surgical History:   Procedure Laterality Date   ICD IMPLANT N/A 08/22/2022   Procedure: ICD IMPLANT;  Surgeon: Nancey Eulas BRAVO, MD;  Location: [facility];  Service: Cardiovascular;  Laterality: N/A;    Prior to Admission medications   Medication Sig Start Date End Date Taking? Authorizing Provider  ACCU-CHEK GUIDE test strip USE TO CHECK BLOOD SUGAR 3 TIMES DAILY 11/27/22   [provider]  acetaminophen  (TYLENOL ) 325 MG tablet Take 2 tablets (650 mg total) by mouth every 4 (four) hours as needed for headache or mild pain. 08/23/22   Lesia Ozell Barter, PA-C  cetirizine (ZYRTEC) 10 MG tablet Take 10 mg by mouth daily. 09/09/18   [provider]  fluticasone  (FLONASE ) 50 MCG/ACT nasal spray Place 1 spray into both nostrils daily. 11/29/23   [provider]  folic acid  (FOLVITE ) 1 MG tablet Take 2 tablets (2 mg total) by mouth daily. 12/17/22   Kassie Acquanetta Bradley, MD  methotrexate  (RHEUMATREX) 2.5 MG tablet Take 4 tablets (10 mg total) by mouth once a week. ill provides refills at next appointment. 01/15/24   Kassie Acquanetta Bradley, MD    Family History  Problem Relation Age of Onset   Sickle cell anemia Mother    Hypertension Mother    Irritable bowel syndrome Mother    Multiple sclerosis Sister    Diabetes Sister    Hypertension Sister    Diabetes Brother    Seizures Brother  as a baby   Diabetes Maternal Grandmother    Cerebral palsy Daughter    Seizures Daughter    Allergic rhinitis Neg Hx    Angioedema Neg Hx    Asthma Neg Hx    Eczema Neg Hx    Urticaria Neg Hx    Immunodeficiency Neg Hx    Breast cancer Neg Hx    Colon cancer Neg Hx    Esophageal cancer Neg Hx      Social History   Tobacco Use   Smoking status: Never   Smokeless tobacco: Never  Vaping Use   Vaping status: Never Used  Substance Use Topics   Alcohol use: No    Alcohol/week: 0.0 standard drinks of alcohol   Drug use: No    Allergies as of 08/28/2024   (No Known Allergies)     Review of Systems:    All systems reviewed and negative except where noted in HPI.   Physical Exam:  BP 100/60   Pulse (!) 57   Ht 5' 4 (1.626 m)   Wt 116 lb (52.6 kg)   BMI 19.91 kg/m  No LMP recorded. (Menstrual status: Irregular Periods).  General:   Alert,  Well-developed, well-nourished, pleasant and cooperative in NAD Lungs:  Respirations even and unlabored.  Clear throughout to auscultation.   No wheezes, crackles, or rhonchi. No acute distress. Heart:  Regular rate and rhythm; no murmurs, clicks, rubs, or gallops. Abdomen:  Normal bowel sounds.  No bruits.  Soft, and non-distended without masses, hepatosplenomegaly or hernias noted.  No Tenderness.  No guarding or rebound tenderness.    Neurologic:  Alert and oriented x3;  grossly normal neurologically. Psych:  Alert and cooperative. Normal mood and affect.   Imaging Studies: CUP PACEART REMOTE DEVICE CHECK Result Date: 08/25/2024 ICD Scheduled remote reviewed. Normal device function.  Presenting rhythm: AS/VS 8 NSVT, 1 EGM, HR 172, duration 22sc, atrial driven 1:1 11 SVT EGM's, longest duration 6sec, HR's 170's Next remote transmission per protocol. LA, CVRS  XR HIP UNILAT W OR W/O PELVIS 1V LEFT Result Date: 08/11/2024 Radiographs of her left hip demonstrate advanced degenerative changes consistent with severe arthritis of her left hip.  No acute fractures noted   Labs: CBC    Component Value Date/Time   WBC 3.3 (L) 07/03/2024 0935   RBC 4.47 07/03/2024 0935   HGB 13.1 07/03/2024 0935   HGB 12.0 01/15/2024 1136   HCT 39.8 07/03/2024 0935   HCT 37.4 01/15/2024 1136   PLT 259 07/03/2024 0935   PLT 220 01/15/2024 1136   MCV 89.0 07/03/2024 0935   MCV 91 01/15/2024 1136    CMP     Component Value Date/Time   NA 136 07/03/2024 0935   NA 141 01/15/2024 1136   K 4.9 07/03/2024 0935   CL 105 07/03/2024 0935   CO2 24 07/03/2024 0935   GLUCOSE 84 07/03/2024 0935   BUN 16 07/03/2024 0935   BUN 10  01/15/2024 1136   CREATININE 0.92 07/03/2024 0935   CALCIUM 9.0 07/03/2024 0935   PROT 6.9 07/03/2024 0935   PROT 6.3 01/15/2024 1136   ALBUMIN 4.3 01/15/2024 1136   AST 28 07/03/2024 0935   ALT 19 07/03/2024 0935   ALKPHOS 61 01/15/2024 1136   BILITOT 0.7 07/03/2024 0935   BILITOT 0.8 01/15/2024 1136   GFRNONAA >60 08/21/2022 1320   GFRAA >60 01/10/2018 0430    Assessment and Plan:   KOBIE MATKINS is a 43 y.o. y/o  female has been referred for:  1.  Colon cancer screening: No previous colonoscopy - Scheduling Colonoscopy  I discussed risks of colonoscopy with patient to include risk of bleeding, colon perforation, and risk of sedation.  Patient expressed understanding and agrees to proceed with colonoscopy.   2.  Comorbidities:  Cardiac sarcoidosis, complete heart block s/p internal cardiac defibrillator placed 08/2022, allergies, GERD, diabetes, OA, Rheumatoid arthritis, obesity.  03/2023 echo LVEF 50 to 55%.  Cardiologist Dr. Nancey and Dr. Jeffrie.  Followed by pulmonology.  Currently asymptomatic and stable. - Obtain cardiac clearance for colonoscopy  3.  Mild chronic constipation - Provide information on dietary modifications to increase fiber intake. - Drink 64 ounces of fluids daily. - Discussed over-the-counter options such as Colace, Senokot, and magnesium for constipation management. - Offered prescription options like Linzess, Amitiza, or Trulance if over-the-counter options are ineffective.  Patient declined prescription today.    Follow up as needed based on colonoscopy results and GI symptoms.  Vicki Console, PA-C

## 2024-08-27 NOTE — Progress Notes (Signed)
 Remote ICD Transmission

## 2024-08-28 ENCOUNTER — Ambulatory Visit: Admitting: Physician Assistant

## 2024-08-28 ENCOUNTER — Ambulatory Visit

## 2024-08-28 ENCOUNTER — Encounter: Payer: Self-pay | Admitting: Physician Assistant

## 2024-08-28 ENCOUNTER — Telehealth: Payer: Self-pay

## 2024-08-28 VITALS — BP 100/60 | HR 57 | Ht 64.0 in | Wt 116.0 lb

## 2024-08-28 DIAGNOSIS — D8685 Sarcoid myocarditis: Secondary | ICD-10-CM | POA: Diagnosis not present

## 2024-08-28 DIAGNOSIS — Z1211 Encounter for screening for malignant neoplasm of colon: Secondary | ICD-10-CM | POA: Diagnosis not present

## 2024-08-28 DIAGNOSIS — Z95810 Presence of automatic (implantable) cardiac defibrillator: Secondary | ICD-10-CM

## 2024-08-28 MED ORDER — NA SULFATE-K SULFATE-MG SULF 17.5-3.13-1.6 GM/177ML PO SOLN: 1.0000 | Freq: Once | ORAL | 0 refills | Status: AC

## 2024-08-28 NOTE — Patient Instructions (Signed)
 You have been scheduled for a Colonoscopy. Please follow written instructions given to you at your visit today.   If you use inhalers (even only as needed), please bring them with you on the day of your procedure.  DO NOT TAKE 7 DAYS PRIOR TO TEST- Trulicity (dulaglutide) Ozempic, Wegovy (semaglutide) Mounjaro (tirzepatide) Bydureon Bcise (exanatide extended release)  DO NOT TAKE 1 DAY PRIOR TO YOUR TEST Rybelsus (semaglutide) Adlyxin (lixisenatide) Victoza (liraglutide) Byetta (exanatide) ___________________________________________________________________________  Please follow up sooner if symptoms increase or worsen   Due to recent changes in healthcare laws, you may see the results of your imaging and laboratory studies on MyChart before your provider has had a chance to review them.  We understand that in some cases there may be results that are confusing or concerning to you. Not all laboratory results come back in the same time frame and the provider may be waiting for multiple results in order to interpret others.  Please give us  48 hours in order for your provider to thoroughly review all the results before contacting the office for clarification of your results.   Thank you for trusting me with your gastrointestinal care!   Ellouise Console, PA-C _______________________________________________________  If your blood pressure at your visit was 140/90 or greater, please contact your primary care physician to follow up on this.  _______________________________________________________  If you are age 26 or older, your body mass index should be between 23-30. Your Body mass index is 19.91 kg/m. If this is out of the aforementioned range listed, please consider follow up with your Primary Care Provider.  If you are age 38 or younger, your body mass index should be between 19-25. Your Body mass index is 19.91 kg/m. If this is out of the aformentioned range listed, please consider  follow up with your Primary Care Provider.   ________________________________________________________  The [facility] providers would like to encourage you to use MYCHART to communicate with providers for non-urgent requests or questions.  Due to long hold times on the telephone, sending your provider a message by [facility] may be a faster and more efficient way to get a response.  Please allow 48 business hours for a response.  Please remember that this is for non-urgent requests.  _______________________________________________________

## 2024-08-28 NOTE — Telephone Encounter (Signed)
 [facility] Pre-operative Risk Assessment     Request for surgical clearance:     Endoscopy Procedure  What type of surgery is being performed?     Colonoscopy  When is this surgery scheduled?     09/23/24  What type of clearance is required ?   Cardiac  Are there any medications that need to be held prior to surgery and how long? N/A  Practice name and name of physician performing surgery?      [facility]  What is your office phone and fax number?      Phone- [phone]  Fax- [phone]  Anesthesia type (None, local, MAC, general) ?       MAC   Please route your response to Alethea Blocker, CMA

## 2024-08-28 NOTE — Progress Notes (Signed)
 Agree with assessment and plan as outlined.

## 2024-08-31 ENCOUNTER — Ambulatory Visit: Payer: Self-pay | Admitting: Cardiovascular Disease

## 2024-09-01 ENCOUNTER — Ambulatory Visit
Admission: RE | Admit: 2024-09-01 | Discharge: 2024-09-01 | Disposition: A | Source: Ambulatory Visit | Attending: Orthopaedic Surgery | Admitting: Orthopaedic Surgery

## 2024-09-01 DIAGNOSIS — M1612 Unilateral primary osteoarthritis, left hip: Secondary | ICD-10-CM | POA: Diagnosis not present

## 2024-09-02 ENCOUNTER — Ambulatory Visit: Admitting: Neurology

## 2024-09-03 ENCOUNTER — Ambulatory Visit (INDEPENDENT_AMBULATORY_CARE_PROVIDER_SITE_OTHER)

## 2024-09-03 ENCOUNTER — Other Ambulatory Visit: Payer: Self-pay

## 2024-09-03 VITALS — BP 110/69 | HR 54 | Temp 98.2°F | Resp 16 | Ht 64.0 in | Wt 118.2 lb

## 2024-09-03 DIAGNOSIS — Z79899 Other long term (current) drug therapy: Secondary | ICD-10-CM

## 2024-09-03 DIAGNOSIS — M06052 Rheumatoid arthritis without rheumatoid factor, left hip: Secondary | ICD-10-CM | POA: Diagnosis not present

## 2024-09-03 DIAGNOSIS — D869 Sarcoidosis, unspecified: Secondary | ICD-10-CM

## 2024-09-03 MED ORDER — ACETAMINOPHEN 325 MG PO TABS
650.0000 mg | ORAL_TABLET | Freq: Once | ORAL | Status: AC
  Administered 2024-09-03: 650 mg via ORAL
  Filled 2024-09-03: qty 2

## 2024-09-03 MED ORDER — DIPHENHYDRAMINE HCL 25 MG PO CAPS
25.0000 mg | ORAL_CAPSULE | Freq: Once | ORAL | Status: AC
  Administered 2024-09-03: 25 mg via ORAL
  Filled 2024-09-03: qty 1

## 2024-09-03 MED ORDER — SODIUM CHLORIDE 0.9 % IV SOLN
3.0000 mg/kg | Freq: Once | INTRAVENOUS | Status: AC
  Administered 2024-09-03: 200 mg via INTRAVENOUS
  Filled 2024-09-03: qty 20

## 2024-09-03 NOTE — Progress Notes (Signed)
 Diagnosis: Sarcoidosis  Provider:  Lonna Coder MD  Procedure: IV Infusion  IV Type: Peripheral, IV Location: R Forearm  Avsola  (infliximab -axxq), Dose: 200 mg  Infusion Start Time: 1034  Infusion Stop Time: 1247  Post Infusion IV Care: Peripheral IV Discontinued  Discharge: Condition: Good, Destination: Home . AVS Declined  Performed by:  Keiron Iodice, RN

## 2024-09-03 NOTE — Telephone Encounter (Signed)
 Left message to call back ASAP that we will need to reschedule her tele visit due to she has a colonoscopy 09/23/24, see notes from Rosaline Bane, NP.

## 2024-09-03 NOTE — Telephone Encounter (Signed)
 Patient has virtual visit scheduled for 12/2 for hip arthroplasty. Also has colonoscopy pending for 11/19. Could we reschedule her VV to 1 week prior to colonoscopy? There are no cardiac medications to hold.  Rosaline EMERSON Bane, NP-C  09/03/2024, 10:45 AM [address] [place], [place] [address] Office [phone] Fax [phone]

## 2024-09-04 ENCOUNTER — Ambulatory Visit: Payer: Self-pay | Admitting: Pulmonary Disease

## 2024-09-04 DIAGNOSIS — Z79899 Other long term (current) drug therapy: Secondary | ICD-10-CM

## 2024-09-04 LAB — COMPREHENSIVE METABOLIC PANEL WITH GFR
AG Ratio: 1.8 (calc) (ref 1.0–2.5)
ALT: 12 U/L (ref 6–29)
AST: 14 U/L (ref 10–35)
Albumin: 4.2 g/dL (ref 3.6–5.1)
Alkaline phosphatase (APISO): 66 U/L (ref 31–125)
BUN/Creatinine Ratio: 23 (calc) — ABNORMAL HIGH (ref 6–22)
BUN: 26 mg/dL — ABNORMAL HIGH (ref 7–25)
CO2: 22 mmol/L (ref 20–32)
Calcium: 9.1 mg/dL (ref 8.6–10.2)
Chloride: 106 mmol/L (ref 98–110)
Creat: 1.15 mg/dL — ABNORMAL HIGH (ref 0.50–0.99)
Globulin: 2.4 g/dL (ref 1.9–3.7)
Glucose, Bld: 79 mg/dL (ref 65–99)
Potassium: 4.5 mmol/L (ref 3.5–5.3)
Sodium: 137 mmol/L (ref 135–146)
Total Bilirubin: 0.6 mg/dL (ref 0.2–1.2)
Total Protein: 6.6 g/dL (ref 6.1–8.1)
eGFR: 60 mL/min/1.73m2 (ref >=60)

## 2024-09-04 LAB — CBC WITH DIFFERENTIAL/PLATELET
Absolute Lymphocytes: 759 {cells}/uL — ABNORMAL LOW (ref 850–3900)
Absolute Monocytes: 324 {cells}/uL (ref 200–950)
Basophils Absolute: 21 {cells}/uL (ref 0–200)
Basophils Relative: 0.7 %
Eosinophils Absolute: 111 {cells}/uL (ref 15–500)
Eosinophils Relative: 3.7 %
HCT: 37.6 % (ref 35.0–45.0)
Hemoglobin: 12.4 g/dL (ref 11.7–15.5)
MCH: 29.1 pg (ref 27.0–33.0)
MCHC: 33 g/dL (ref 32.0–36.0)
MCV: 88.3 fL (ref 80.0–100.0)
MPV: 11.2 fL (ref 7.5–12.5)
Monocytes Relative: 10.8 %
Neutro Abs: 1785 {cells}/uL (ref 1500–7800)
Neutrophils Relative %: 59.5 %
Platelets: 269 Thousand/uL (ref 140–400)
RBC: 4.26 Million/uL (ref 3.80–5.10)
RDW: 13.5 % (ref 11.0–15.0)
Total Lymphocyte: 25.3 %
WBC: 3 Thousand/uL — ABNORMAL LOW (ref 3.8–10.8)

## 2024-09-04 NOTE — Telephone Encounter (Signed)
 S/w the pt and she has moved her original tele preop appt up sooner for her hip surgery as she is also needing preop clearance for colonoscopy 09/23/24.   Tele appt moved to 09/16/24. I will update the GI office the pt has tele preop appt 09/16/24.      Me    09/03/24 10:55 AM Note Left message to call back ASAP that we will need to reschedule her tele visit due to she has a colonoscopy 09/23/24, see notes from Rosaline Bane, NP.       09/03/24 10:54 AM You attempted to contact [place], Alan N (Left Message)

## 2024-09-04 NOTE — Telephone Encounter (Signed)
 Patient returning call.

## 2024-09-07 ENCOUNTER — Encounter: Payer: Self-pay | Admitting: Radiology

## 2024-09-07 ENCOUNTER — Ambulatory Visit: Admitting: Neurology

## 2024-09-08 NOTE — Progress Notes (Unsigned)
 NEUROLOGY FOLLOW UP OFFICE NOTE  Vicki Mccoy [id number]  Assessment/Plan:   Menstrual migraine, stable   Migraine prevention:  continue magnesium Migraine rescue:  She has samples of Nurtec to try if needed.  On first day has a migraine, will take a Nurtec once daily for 3 days.  Due to history of heart block, would like to avoid triptans.   Limit use of pain relievers to no more than 2 days out of week to prevent risk of rebound or medication-overuse headache. Keep headache diary Follow up 1 year.       Subjective:  Vicki Mccoy is a 43 year old female with sarcoidosis with cardiac involvement (heart block s/p ICD), DM 2 with polyneuropathy, arthritis and Sickle cell trait who follows up for migraines.   UPDATE: Provided samples of Nurtec to try as a mini-prophylaxis during menses but hasn't needed to try it.  Last migraine was in March, November before then.   Current NSAIDS/analgesics:  Tylenol  Current triptans: Would avoid due to heart block Current ergotamine:  none Current anti-emetic:  none Current muscle relaxants:  none Current Antihypertensive medications:  none Current Antidepressant medications:  none Current Anticonvulsant medications: none Current anti-CGRP:  Nurtec mini-prophylaxis (samples- not needed to use) Current Vitamins/Herbal/Supplements:  Magnesium Current Antihistamines/Decongestants:  none Other therapy:  none     Caffeine:  coffee occasionally.  Occasional soda Alcohol:  no Smoker:  no Diet:  5-6 bottles water daily.  Rarely soda.   Exercise:  walks Depression:  no; Anxiety:  no Other pain:  no Sleep hygiene:  good   HISTORY:  Onset:  Menstrual migraines as a teenager  They subsequently resolved but returned about 2 months ago.   Location:  left temple radiating into eye Quality:  throbbing Intensity:  10/10 Aura:  absent Prodrome:  absent Associated symptoms:  Photophobia, sometimes blurred vision.  She denies associated  nausea, vomiting, phonophobia, osmophobia, autonomic symptoms, unilateral numbness or weakness. Duration:  3-4 days.  Aborts quickly with [facility] but pain will returns 8-9 hours later Frequency:  Occurs with her cycle.  Starts one day before menses Triggers:  Menses Relieving factors:  BC powder Activity:  aggravates     Past NSAIDS/analgesics:  naproxen, ibuprofen, Tylenol , Excedrin igraine Past abortive triptans:  sumatriptan tab, zolmitriptan  (dizziness) Past abortive ergotamine:  none Past muscle relaxants:  cyclobenzaprine  Past anti-emetic:  none Past antihypertensive medications:  furosemide Past antidepressant medications:  none Past anticonvulsant medications:  topiramate  (blurred vision, fatigue), gabapentin Past anti-CGRP:  Ubrelvy 100mg , Zavzpret Past vitamins/Herbal/Supplements:  none Past antihistamines/decongestants:  meclizine , Zyrtec, Flonase  Other past therapies:  none   PAST MEDICAL HISTORY: Past Medical History:  Diagnosis Date   Allergy    Arthritis    Cardiac sarcoidosis    Complete heart block (HCC)    Diabetes mellitus    Sickle cell trait    URI 09/20/2010    MEDICATIONS: Current Outpatient Medications on File Prior to Visit  Medication Sig Dispense Refill   ACCU-CHEK GUIDE test strip USE TO CHECK BLOOD SUGAR 3 TIMES DAILY     acetaminophen  (TYLENOL ) 325 MG tablet Take 2 tablets (650 mg total) by mouth every 4 (four) hours as needed for headache or mild pain.     cetirizine (ZYRTEC) 10 MG tablet Take 10 mg by mouth daily.     fluticasone  (FLONASE ) 50 MCG/ACT nasal spray Place 1 spray into both nostrils daily.     folic acid  (FOLVITE ) 1 MG tablet Take 2 tablets (2  mg total) by mouth daily. (Patient taking differently: Take 1 mg by mouth daily.) 180 tablet 3   methotrexate  (RHEUMATREX) 2.5 MG tablet Take 4 tablets (10 mg total) by mouth once a week. ill provides refills at next appointment. 48 tablet 2   OVER THE COUNTER MEDICATION Digestive enzymes : 2  pills every other day prn     SUMAtriptan (IMITREX) 25 MG tablet Take 25 mg by mouth every 2 (two) hours as needed.     No current facility-administered medications on file prior to visit.    ALLERGIES: No Known Allergies  FAMILY HISTORY: Family History  Problem Relation Age of Onset   Sickle cell anemia Mother    Hypertension Mother    Irritable bowel syndrome Mother    Multiple sclerosis Sister    Diabetes Sister    Hypertension Sister    Diabetes Brother    Seizures Brother        as a development worker, international aid   Diabetes Maternal Grandmother    Cerebral palsy Daughter    Seizures Daughter    Allergic rhinitis Neg Hx    Angioedema Neg Hx    Asthma Neg Hx    Eczema Neg Hx    Urticaria Neg Hx    Immunodeficiency Neg Hx    Breast cancer Neg Hx    Colon cancer Neg Hx    Esophageal cancer Neg Hx       Objective:  Blood pressure 132/80, pulse 77, height 5' 4 (1.626 m), weight 119 lb (54 kg), SpO2 98%. General: No acute distress.  Patient appears well-groomed.   Head:  Normocephalic/atraumatic Neck:  Supple.  No paraspinal tenderness.  Full range of motion. Heart:  Regular rate and rhythm. Neuro:  Alert and oriented.  Speech fluent and not dysarthric.  Language intact.  CN II-XII intact.  Bulk and tone normal.  Muscle strength 5/5 throughout.  Sensation to light touch intact.  Deep tendon reflexes 2+ throughout, toes downgoing.  Gait normal.  Romberg negative.    Juliene Dunnings, DO  CC: Rosina Amy, PA

## 2024-09-09 ENCOUNTER — Encounter: Payer: Self-pay | Admitting: Neurology

## 2024-09-09 ENCOUNTER — Ambulatory Visit (INDEPENDENT_AMBULATORY_CARE_PROVIDER_SITE_OTHER): Admitting: Neurology

## 2024-09-09 VITALS — BP 132/80 | HR 77 | Ht 64.0 in | Wt 119.0 lb

## 2024-09-09 DIAGNOSIS — G43829 Menstrual migraine, not intractable, without status migrainosus: Secondary | ICD-10-CM

## 2024-09-09 NOTE — Telephone Encounter (Signed)
 Pt is requesting a callback regarding her wanting to r/s her Tele preop appt. Please advise

## 2024-09-09 NOTE — Patient Instructions (Signed)
 Continue magnesium  If you should get a migraine during your cycle, take Nurtec on first day of migraine and take once daily for 3 days.

## 2024-09-09 NOTE — Telephone Encounter (Signed)
 I s/w the pt who tells me she needs a different time. She has rescheduled tele to 09/22/24 .

## 2024-09-16 ENCOUNTER — Encounter: Payer: Self-pay | Admitting: Gastroenterology

## 2024-09-16 ENCOUNTER — Ambulatory Visit

## 2024-09-21 NOTE — Progress Notes (Unsigned)
 Virtual Visit via Telephone Note   Because of Vicki Mccoy co-morbid illnesses, she is at least at moderate risk for complications without adequate follow up.  This format is felt to be most appropriate for this patient at this time.  Due to technical limitations with video connection (technology), today's appointment will be conducted as an audio only telehealth visit, and Vicki Mccoy verbally agreed to proceed in this manner.   All issues noted in this document were discussed and addressed.  No physical exam could be performed with this format.  Evaluation Performed:  Preoperative cardiovascular risk assessment _____________   Date:  09/21/2024   Patient ID:  Vicki Mccoy, DOB 1981-01-08, MRN [id number] Patient Location:  Home Provider location:   Office  Primary Care Provider:  Rosalea Rosina SAILOR, PA Primary Cardiologist:  None  Chief Complaint / Patient Profile   43 y.o. y/o female with a h/o complete heart block, GERD, obesity who is pending colonoscopy and presents today for telephonic preoperative cardiovascular risk assessment.  History of Present Illness    Vicki Mccoy is a 43 y.o. female who presents via audio/video conferencing for a telehealth visit today.  Pt was last seen in cardiology clinic on 01/03/2024 by Vicki Mccoy.  At that time Vicki Mccoy was doing well .  The patient is now pending procedure as outlined above. Since her last visit, she continues to be stable from a cardiac standpoint.  Today she denies chest pain, shortness of breath, lower extremity edema, fatigue, palpitations, melena, hematuria, hemoptysis, diaphoresis, weakness, presyncope, syncope, orthopnea, and PND.   Past Medical History    Past Medical History:  Diagnosis Date   Allergy    Arthritis    Cardiac sarcoidosis    Complete heart block (HCC)    Diabetes mellitus    Sickle cell trait    URI 09/20/2010   Past Surgical History:  Procedure Laterality Date   ICD  IMPLANT N/A 08/22/2022   Procedure: ICD IMPLANT;  Surgeon: Vicki Eulas BRAVO, MD;  Location: [facility] INVASIVE CV [facility];  Service: Cardiovascular;  Laterality: N/A;    Allergies  No Known Allergies  Home Medications    Prior to Admission medications   Medication Sig Start Date End Date Taking? Authorizing Provider  ACCU-CHEK GUIDE test strip USE TO CHECK BLOOD SUGAR 3 TIMES DAILY 11/27/22   [provider]  acetaminophen  (TYLENOL ) 325 MG tablet Take 2 tablets (650 mg total) by mouth every 4 (four) hours as needed for headache or mild pain. 08/23/22   Vicki Ozell Barter, Mccoy  cetirizine (ZYRTEC) 10 MG tablet Take 10 mg by mouth daily. 09/09/18   [provider]  fluticasone  (FLONASE ) 50 MCG/ACT nasal spray Place 1 spray into both nostrils daily. 11/29/23   [provider]  folic acid  (FOLVITE ) 1 MG tablet Take 2 tablets (2 mg total) by mouth daily. Patient taking differently: Take 1 mg by mouth daily. 12/17/22   Vicki Acquanetta Bradley, MD  methotrexate  (RHEUMATREX) 2.5 MG tablet Take 4 tablets (10 mg total) by mouth once a week. ill provides refills at next appointment. 01/15/24   Vicki Acquanetta Bradley, MD  OVER THE COUNTER MEDICATION Digestive enzymes : 2 pills every other day prn    [provider]  SUMAtriptan (IMITREX) 25 MG tablet Take 25 mg by mouth every 2 (two) hours as needed. 07/21/24   [provider]    Physical Exam    Vital Signs:  Vicki Mccoy  does not have vital signs available for review today.  Given telephonic nature of communication, physical exam is limited. AAOx3. NAD. Normal affect.  Speech and respirations are unlabored.  Accessory Clinical Findings    None  Assessment & Plan    1.  Preoperative Cardiovascular Risk Assessment: Colonoscopy 09/23/2024, [facility]cc[facility], fax #[phone]     Primary Cardiologist: None  Chart reviewed as part of pre-operative protocol coverage. Given past medical history and time since  last visit, based on ACC/AHA guidelines, Vicki Mccoy would be at acceptable risk for the planned procedure without further cardiovascular testing.   Patient was advised that if she develops new symptoms prior to surgery to contact our office to arrange a follow-up appointment.  She verbalized understanding.  She is currently not taking any antiplatelet or anticoagulant therapy.  I will route this recommendation to the requesting party via Epic fax function and remove from pre-op pool.       Time:   Today, I have spent 5 minutes with the patient with telehealth technology discussing medical history, symptoms, and management plan.  I spent 10 minutes reviewing patient's past cardiac history and cardiac medications.    Vicki Mccoy [place], NP  09/21/2024, 3:43 PM

## 2024-09-22 ENCOUNTER — Ambulatory Visit: Attending: Internal Medicine

## 2024-09-22 DIAGNOSIS — Z01810 Encounter for preprocedural cardiovascular examination: Secondary | ICD-10-CM | POA: Diagnosis not present

## 2024-09-23 ENCOUNTER — Ambulatory Visit (AMBULATORY_SURGERY_CENTER): Admitting: Gastroenterology

## 2024-09-23 ENCOUNTER — Encounter: Payer: Self-pay | Admitting: Gastroenterology

## 2024-09-23 VITALS — BP 119/68 | HR 50 | Temp 98.2°F | Resp 19 | Ht 64.0 in | Wt 116.0 lb

## 2024-09-23 DIAGNOSIS — Z1211 Encounter for screening for malignant neoplasm of colon: Secondary | ICD-10-CM

## 2024-09-23 DIAGNOSIS — Q439 Congenital malformation of intestine, unspecified: Secondary | ICD-10-CM | POA: Diagnosis not present

## 2024-09-23 DIAGNOSIS — K648 Other hemorrhoids: Secondary | ICD-10-CM | POA: Diagnosis not present

## 2024-09-23 DIAGNOSIS — K5730 Diverticulosis of large intestine without perforation or abscess without bleeding: Secondary | ICD-10-CM | POA: Diagnosis not present

## 2024-09-23 MED ORDER — FLEET ENEMA RE ENEM
1.0000 | ENEMA | Freq: Once | RECTAL | Status: AC
  Administered 2024-09-23: 1 via RECTAL

## 2024-09-23 MED ORDER — SODIUM CHLORIDE 0.9 % IV SOLN: 500.0000 mL | INTRAVENOUS | Status: DC

## 2024-09-23 NOTE — Progress Notes (Signed)
 History and Physical Interval Note: Here for colonoscopy - first time exam. Mild constipation. Did prep - liquid stools mostly clear with mild sediment. Discussed colonoscopy, risks / benefits, she wishes to proceed. All questions answered. History of cardiac sarcoidosis, cleared to proceed per cardiology.   09/23/2024 4:23 PM  Vicki Mccoy [place]  has presented today for endoscopic procedure(s), with the diagnosis of  Encounter Diagnosis  Name Primary?   Colon cancer screening Yes  .  The various methods of evaluation and treatment have been discussed with the patient and/or family. After consideration of risks, benefits and other options for treatment, the patient has consented to  the endoscopic procedure(s).   The patient's history has been reviewed, patient examined, no change in status, stable for surgery.  I have reviewed the patient's chart and labs. The patient was provided an opportunity to ask questions and all were answered. The patient agreed with the plan.    Marcey Naval, MD [facility]

## 2024-09-23 NOTE — Progress Notes (Signed)
 Report given to PACU.   Patient experiencing nausea and retching.  MD updated and Zofran  4 mg IV given, vss

## 2024-09-23 NOTE — Patient Instructions (Signed)
Handouts given on diverticulosis and hemorrhoids.  YOU HAD AN ENDOSCOPIC PROCEDURE TODAY AT [facility]:   Refer to the procedure report that was given to you for any specific questions about what was found during the examination.  If the procedure report does not answer your questions, please call your gastroenterologist to clarify.  If you requested that your care partner not be given the details of your procedure findings, then the procedure report has been included in a sealed envelope for you to review at your convenience later.  YOU SHOULD EXPECT: Some feelings of bloating in the abdomen. Passage of more gas than usual.  Walking can help get rid of the air that was put into your GI tract during the procedure and reduce the bloating. If you had a lower endoscopy (such as a colonoscopy or flexible sigmoidoscopy) you may notice spotting of blood in your stool or on the toilet paper. If you underwent a bowel prep for your procedure, you may not have a normal bowel movement for a few days.  Please Note:  You might notice some irritation and congestion in your nose or some drainage.  This is from the oxygen used during your procedure.  There is no need for concern and it should clear up in a day or so.  SYMPTOMS TO REPORT IMMEDIATELY:  Following lower endoscopy (colonoscopy or flexible sigmoidoscopy):  Excessive amounts of blood in the stool  Significant tenderness or worsening of abdominal pains  Swelling of the abdomen that is new, acute  Fever of 100F or higher   For urgent or emergent issues, a gastroenterologist can be reached at any hour by calling [phone]. Do not use MyChart messaging for urgent concerns.    DIET:  We do recommend a small meal at first, but then you may proceed to your regular diet.  Drink plenty of fluids but you should avoid alcoholic beverages for 24 hours.  ACTIVITY:  You should plan to take it easy for the rest of today and you should NOT  DRIVE or use heavy machinery until tomorrow (because of the sedation medicines used during the test).    FOLLOW UP: Our staff will call the number listed on your records the next business day following your procedure.  We will call around 7:15- 8:00 am to check on you and address any questions or concerns that you may have regarding the information given to you following your procedure. If we do not reach you, we will leave a message.     If any biopsies were taken you will be contacted by phone or by letter within the next 1-3 weeks.  Please call us at [phone] if you have not heard about the biopsies in 3 weeks.    SIGNATURES/CONFIDENTIALITY: You and/or your care partner have signed paperwork which will be entered into your electronic medical record.  These signatures attest to the fact that that the information above on your After Visit Summary has been reviewed and is understood.  Full responsibility of the confidentiality of this discharge information lies with you and/or your care-partner.

## 2024-09-23 NOTE — Op Note (Signed)
 [facility] Patient Name: Vicki Mccoy Procedure Date: 09/23/2024 4:32 PM MRN: [id number] Endoscopist: Elspeth P. Leigh , MD, [id number] Age: 43 Referring MD:  Date of Birth: 1980-12-10 Gender: Female Account #: [id number] Procedure:                Colonoscopy Indications:              Screening for colorectal malignant neoplasm, This                            is the patient's first colonoscopy Medicines:                Monitored Anesthesia Care Procedure:                Pre-Anesthesia Assessment:                           - Prior to the procedure, a History and Physical                            was performed, and patient medications and                            allergies were reviewed. The patient's tolerance of                            previous anesthesia was also reviewed. The risks                            and benefits of the procedure and the sedation                            options and risks were discussed with the patient.                            All questions were answered, and informed consent                            was obtained. Prior Anticoagulants: The patient has                            taken no anticoagulant or antiplatelet agents. ASA                            Grade Assessment: III - A patient with severe                            systemic disease. After reviewing the risks and                            benefits, the patient was deemed in satisfactory                            condition to undergo the procedure.  After obtaining informed consent, the colonoscope                            was passed under direct vision. Throughout the                            procedure, the patient's blood pressure, pulse, and                            oxygen saturations were monitored continuously. The                            PCF-H190TL Slim SN [id number] was introduced through                            the anus and  advanced to the the cecum, identified                            by appendiceal orifice and ileocecal valve. The                            colonoscopy was performed without difficulty. The                            patient tolerated the procedure well. The quality                            of the bowel preparation was adequate. The                            ileocecal valve, appendiceal orifice, and rectum                            were photographed. Scope In: 4:34:55 PM Scope Out: 4:54:57 PM Scope Withdrawal Time: 0 hours 13 minutes 37 seconds  Total Procedure Duration: 0 hours 20 minutes 2 seconds  Findings:                 The perianal and digital rectal examinations were                            normal.                           The colon was extremely tortuous. Ultraslim                            pediatric colonoscope used to perform this exam.                           Multiple diverticula were found in the left colon.                           Internal hemorrhoids were found during  retroflexion. The hemorrhoids were small.                           The exam was otherwise without abnormality. Complications:            No immediate complications. Estimated blood loss:                            None. Estimated Blood Loss:     Estimated blood loss: none. Impression:               - Tortuous colon.                           - Diverticulosis in the left colon.                           - Internal hemorrhoids.                           - The examination was otherwise normal.                           - No polyps. Recommendation:           - Patient has a contact number available for                            emergencies. The signs and symptoms of potential                            delayed complications were discussed with the                            patient. Return to normal activities tomorrow.                            Written discharge  instructions were provided to the                            patient.                           - Resume previous diet.                           - Continue present medications.                           - Repeat colonoscopy in 10 years for screening                            purposes. Elspeth P. Buster Schueller, MD 09/23/2024 5:01:18 PM This report has been signed electronically.

## 2024-09-23 NOTE — Progress Notes (Signed)
 1648 Patient experiencing nausea and retching.  MD updated and Zofran  4 mg IV given, vss

## 2024-09-23 NOTE — Progress Notes (Signed)
 Pt's states no medical or surgical changes since previsit or office visit.  Enema given pre-procedure - after enema, bowel movement has brown sediment

## 2024-09-24 ENCOUNTER — Telehealth: Payer: Self-pay | Admitting: *Deleted

## 2024-09-24 NOTE — Telephone Encounter (Signed)
  Follow up Call-     09/23/2024    3:35 PM  Call back number  Post procedure Call Back phone  # [phone]  Permission to leave phone message Yes     Patient questions:  Do you have a fever, pain , or abdominal swelling? No. Pain Score  0 *  Have you tolerated food without any problems? Yes.    Have you been able to return to your normal activities? Yes.    Do you have any questions about your discharge instructions: Diet   No. Medications  No. Follow up visit  No.  Do you have questions or concerns about your Care? No.  Actions: * If pain score is 4 or above: No action needed, pain <4.

## 2024-10-06 ENCOUNTER — Telehealth (HOSPITAL_COMMUNITY): Payer: Self-pay | Admitting: *Deleted

## 2024-10-06 ENCOUNTER — Ambulatory Visit

## 2024-10-06 NOTE — Telephone Encounter (Signed)
 Attempted to call patient regarding upcoming cardiac PET appointment. Left message on voicemail with name and callback number  Larey Brick RN Navigator Cardiac Imaging [facility] [phone] Office [phone] Cell

## 2024-10-06 NOTE — Progress Notes (Signed)
 Anesthesia Review:  PCP: Cardiologist : 09/22/24- preop  Neuro- DR lew 09/09/2024 LOV  [facility] Staff LVO 07/17/24   PPM/ ICD:- ICD - 2023 last check 1021/25  Device Orders: Rep Notified:  Chest x-ray : EKG : Echo : Stress test: Cardiac Cath :   Activity level:  Sleep Study/ CPAP : Fasting Blood Sugar :      / Checks Blood Sugar -- times a day:     DM- type  Hgba1c-08/20/24- 5.3  Blood Thinner/ Instructions /Last Dose: ASA / Instructions/ Last Dose :    Colonoscopy 09/23/24

## 2024-10-06 NOTE — Patient Instructions (Signed)
 SURGICAL WAITING ROOM VISITATION  Patients having surgery or a procedure may have no more than 2 support people in the waiting area - these visitors may rotate.    Children under the age of 14 must have an adult with them who is not the patient.  Visitors with respiratory illnesses are discouraged from visiting and should remain at home.  If the patient needs to stay at the hospital during part of their recovery, the visitor guidelines for inpatient rooms apply. Pre-op nurse will coordinate an appropriate time for 1 support person to accompany patient in pre-op.  This support person may not rotate.    Please refer to the [facility] website for the visitor guidelines for Inpatients (after your surgery is over and you are in a regular room).       Your procedure is scheduled on:   10/16/2024    Report to [facility] Main Entrance    Report to admitting at   0730AM   Call this number if you have problems the morning of surgery [phone]   Do not eat food :After Midnight.   After Midnight you may have the following liquids until _ 0700_____ AM  DAY OF SURGERY  Water Non-Citrus Juices (without pulp, NO RED-Apple, White grape, White cranberry) Black Coffee (NO MILK/CREAM OR CREAMERS, sugar ok)  Clear Tea (NO MILK/CREAM OR CREAMERS, sugar ok) regular and decaf                             Plain Jell-O (NO RED)                                           Fruit ices (not with fruit pulp, NO RED)                                     Popsicles (NO RED)                                                               Sports drinks like Gatorade (NO RED)                    The day of surgery:  Drink ONE (1) Pre-Surgery Clear Ensure or G2 at   0700AM the morning of surgery. Drink in one sitting. Do not sip.  This drink was given to you during your hospital  pre-op appointment visit. Nothing else to drink after completing the  Pre-Surgery Clear Ensure or G2.          If you have  questions, please contact your surgeon's office.       Oral Hygiene is also important to reduce your risk of infection.                                    Remember - BRUSH YOUR TEETH THE MORNING OF SURGERY WITH YOUR REGULAR TOOTHPASTE  DENTURES WILL BE REMOVED PRIOR TO SURGERY PLEASE DO NOT APPLY Poly  grip OR ADHESIVES!!!   Do NOT smoke after Midnight   Stop all vitamins and herbal supplements 7 days before surgery.   Take these medicines the morning of surgery with A SIP OF WATER:  zyrtec, flonase  if needed   DO NOT TAKE ANY ORAL DIABETIC MEDICATIONS DAY OF YOUR SURGERY  Bring CPAP mask and tubing day of surgery.                              You may not have any metal on your body including hair pins, jewelry, and body piercing             Do not wear make-up, lotions, powders, perfumes/cologne, or deodorant  Do not wear nail polish including gel and S&S, artificial/acrylic nails, or any other type of covering on natural nails including finger and toenails. If you have artificial nails, gel coating, etc. that needs to be removed by a nail salon please have this removed prior to surgery or surgery may need to be canceled/ delayed if the surgeon/ anesthesia feels like they are unable to be safely monitored.   Do not shave  48 hours prior to surgery.               Men may shave face and neck.   Do not bring valuables to the hospital. [facility] IS NOT             RESPONSIBLE   FOR VALUABLES.   Contacts, glasses, dentures or bridgework may not be worn into surgery.   Bring small overnight bag day of surgery.   DO NOT BRING YOUR HOME MEDICATIONS TO THE HOSPITAL. PHARMACY WILL DISPENSE MEDICATIONS LISTED ON YOUR MEDICATION LIST TO YOU DURING YOUR ADMISSION IN THE HOSPITAL!    Patients discharged on the day of surgery will not be allowed to drive home.  Someone NEEDS to stay with you for the first 24 hours after anesthesia.   Special Instructions: Bring a copy of your  healthcare power of attorney and living will documents the day of surgery if you haven't scanned them before.              Please read over the following fact sheets you were given: IF YOU HAVE QUESTIONS ABOUT YOUR PRE-OP INSTRUCTIONS PLEASE CALL [phone].   If you received a COVID test during your pre-op visit  it is requested that you wear a mask when out in public, stay away from anyone that may not be feeling well and notify your surgeon if you develop symptoms. If you test positive for Covid or have been in contact with anyone that has tested positive in the last 10 days please notify you surgeon.      Pre-operative 4 CHG Bath Instructions   You can play a key role in reducing the risk of infection after surgery. Your skin needs to be as free of germs as possible. You can reduce the number of germs on your skin by washing with CHG (chlorhexidine  gluconate) soap before surgery. CHG is an antiseptic soap that kills germs and continues to kill germs even after washing.   DO NOT use if you have an allergy to chlorhexidine /CHG or antibacterial soaps. If your skin becomes reddened or irritated, stop using the CHG and notify one of our RNs at [phone].   Please shower with the CHG soap starting 4 days before surgery using the following schedule:     Please  keep in mind the following:  DO NOT shave, including legs and underarms, starting the day of your first shower.   You may shave your face at any point before/day of surgery.  Place clean sheets on your bed the day you start using CHG soap. Use a clean washcloth (not used since being washed) for each shower. DO NOT sleep with pets once you start using the CHG.   CHG Shower Instructions:  If you choose to wash your hair and private area, wash first with your normal shampoo/soap.  After you use shampoo/soap, rinse your hair and body thoroughly to remove shampoo/soap residue.  Turn the water OFF and apply about 3 tablespoons (45 ml) of  CHG soap to a CLEAN washcloth.  Apply CHG soap ONLY FROM YOUR NECK DOWN TO YOUR TOES (washing for 3-5 minutes)  DO NOT use CHG soap on face, private areas, open wounds, or sores.  Pay special attention to the area where your surgery is being performed.  If you are having back surgery, having someone wash your back for you may be helpful. Wait 2 minutes after CHG soap is applied, then you may rinse off the CHG soap.  Pat dry with a clean towel  Put on clean clothes/pajamas   If you choose to wear lotion, please use ONLY the CHG-compatible lotions on the back of this paper.     Additional instructions for the day of surgery: DO NOT APPLY any lotions, deodorants, cologne, or perfumes.   Put on clean/comfortable clothes.  Brush your teeth.  Ask your nurse before applying any prescription medications to the skin.      CHG Compatible Lotions   Aveeno Moisturizing lotion  Cetaphil Moisturizing Cream  Cetaphil Moisturizing Lotion  Clairol Herbal Essence Moisturizing Lotion, Dry Skin  Clairol Herbal Essence Moisturizing Lotion, Extra Dry Skin  Clairol Herbal Essence Moisturizing Lotion, Normal Skin  Curel Age Defying Therapeutic Moisturizing Lotion with Alpha Hydroxy  Curel Extreme Care Body Lotion  Curel Soothing Hands Moisturizing Hand Lotion  Curel Therapeutic Moisturizing Cream, Fragrance-Free  Curel Therapeutic Moisturizing Lotion, Fragrance-Free  Curel Therapeutic Moisturizing Lotion, Original Formula  Eucerin Daily Replenishing Lotion  Eucerin Dry Skin Therapy Plus Alpha Hydroxy Crme  Eucerin Dry Skin Therapy Plus Alpha Hydroxy Lotion  Eucerin Original Crme  Eucerin Original Lotion  Eucerin Plus Crme Eucerin Plus Lotion  Eucerin TriLipid Replenishing Lotion  Keri Anti-Bacterial Hand Lotion  Keri Deep Conditioning Original Lotion Dry Skin Formula Softly Scented  Keri Deep Conditioning Original Lotion, Fragrance Free Sensitive Skin Formula  Keri Lotion Fast Absorbing  Fragrance Free Sensitive Skin Formula  Keri Lotion Fast Absorbing Softly Scented Dry Skin Formula  Keri Original Lotion  Keri Skin Renewal Lotion Keri Silky Smooth Lotion  Keri Silky Smooth Sensitive Skin Lotion  Nivea Body Creamy Conditioning Oil  Nivea Body Extra Enriched Teacher, Adult Education Moisturizing Lotion Nivea Crme  Nivea Skin Firming Lotion  NutraDerm 30 Skin Lotion  NutraDerm Skin Lotion  NutraDerm Therapeutic Skin Cream  NutraDerm Therapeutic Skin Lotion  ProShield Protective Hand Cream  Provon moisturizing lotion

## 2024-10-06 NOTE — Telephone Encounter (Signed)
 Patient returning call about her upcoming cardiac imaging study; pt verbalizes understanding of appt date/time, parking situation and where to check in, pre-test NPO status; name and call back number provided for further questions should they arise  Chantal Requena RN Navigator Cardiac Imaging [facility] [phone] office [phone] cell  Reviewed diet prep with patient. She verbalized understanding.

## 2024-10-07 ENCOUNTER — Other Ambulatory Visit: Payer: Self-pay | Admitting: Physician Assistant

## 2024-10-07 ENCOUNTER — Encounter (HOSPITAL_COMMUNITY): Payer: Self-pay

## 2024-10-07 ENCOUNTER — Other Ambulatory Visit: Payer: Self-pay

## 2024-10-07 ENCOUNTER — Encounter (HOSPITAL_COMMUNITY)
Admission: RE | Admit: 2024-10-07 | Discharge: 2024-10-07 | Disposition: A | Discharge status: Home / Self Care | Source: Ambulatory Visit | Attending: Orthopaedic Surgery | Admitting: Orthopaedic Surgery

## 2024-10-07 VITALS — BP 119/68 | HR 54 | Temp 98.0°F | Resp 16 | Ht 64.0 in | Wt 114.0 lb

## 2024-10-07 DIAGNOSIS — M1612 Unilateral primary osteoarthritis, left hip: Secondary | ICD-10-CM

## 2024-10-07 DIAGNOSIS — Z01818 Encounter for other preprocedural examination: Secondary | ICD-10-CM

## 2024-10-07 HISTORY — DX: Prediabetes: R73.03

## 2024-10-07 HISTORY — DX: Presence of automatic (implantable) cardiac defibrillator: Z95.810

## 2024-10-07 LAB — CBC
HCT: 37.5 % (ref 36.0–46.0)
Hemoglobin: 12.5 g/dL (ref 12.0–15.0)
MCH: 29.2 pg (ref 26.0–34.0)
MCHC: 33.3 g/dL (ref 30.0–36.0)
MCV: 87.6 fL (ref 80.0–100.0)
Platelets: 262 K/uL (ref 150–400)
RBC: 4.28 MIL/uL (ref 3.87–5.11)
RDW: 13.7 % (ref 11.5–15.5)
WBC: 4.8 K/uL (ref 4.0–10.5)
nRBC: 0 % (ref 0.0–0.2)

## 2024-10-07 LAB — BASIC METABOLIC PANEL WITH GFR
Anion gap: 9 (ref 5–15)
BUN: 21 mg/dL — ABNORMAL HIGH (ref 6–20)
CO2: 25 mmol/L (ref 22–32)
Calcium: 9.6 mg/dL (ref 8.9–10.3)
Chloride: 104 mmol/L (ref 98–111)
Creatinine, Ser: 0.92 mg/dL (ref 0.44–1.00)
GFR, Estimated: >60 mL/min (ref >60)
Glucose, Bld: 86 mg/dL (ref 70–99)
Potassium: 4.1 mmol/L (ref 3.5–5.1)
Sodium: 137 mmol/L (ref 135–145)

## 2024-10-07 LAB — SURGICAL PCR SCREEN
MRSA, PCR: NEGATIVE
Staphylococcus aureus: NEGATIVE

## 2024-10-07 MED ORDER — METHOCARBAMOL 750 MG PO TABS: 750.0000 mg | ORAL_TABLET | Freq: Three times a day (TID) | ORAL | 2 refills | Status: AC | PRN

## 2024-10-07 MED ORDER — OXYCODONE-ACETAMINOPHEN 5-325 MG PO TABS: 1.0000 | ORAL_TABLET | Freq: Three times a day (TID) | ORAL | 0 refills | Status: DC | PRN

## 2024-10-07 MED ORDER — DOCUSATE SODIUM 100 MG PO CAPS: 100.0000 mg | ORAL_CAPSULE | Freq: Every day | ORAL | 2 refills | Status: AC | PRN

## 2024-10-07 MED ORDER — DOXYCYCLINE HYCLATE 100 MG PO TABS: 100.0000 mg | ORAL_TABLET | Freq: Two times a day (BID) | ORAL | 0 refills | Status: AC

## 2024-10-07 MED ORDER — ONDANSETRON HCL 4 MG PO TABS: 4.0000 mg | ORAL_TABLET | Freq: Three times a day (TID) | ORAL | 0 refills | Status: AC | PRN

## 2024-10-08 ENCOUNTER — Encounter (HOSPITAL_COMMUNITY)
Admission: RE | Admit: 2024-10-08 | Discharge: 2024-10-08 | Disposition: A | Source: Ambulatory Visit | Attending: Pulmonary Disease

## 2024-10-08 DIAGNOSIS — R918 Other nonspecific abnormal finding of lung field: Secondary | ICD-10-CM | POA: Diagnosis not present

## 2024-10-08 DIAGNOSIS — D8685 Sarcoid myocarditis: Secondary | ICD-10-CM | POA: Insufficient documentation

## 2024-10-08 DIAGNOSIS — R931 Abnormal findings on diagnostic imaging of heart and coronary circulation: Secondary | ICD-10-CM | POA: Diagnosis not present

## 2024-10-08 LAB — NM PET CT MYOCARDIAL SARCOIDOSIS
LV dias vol: 74 mL (ref 46–106)
Nuc Stress EF: 58 %
Rest Nuclear Isotope Dose: 13.6 mCi

## 2024-10-08 MED ORDER — FLUDEOXYGLUCOSE F - 18 (FDG) INJECTION
8.9000 | Freq: Once | INTRAVENOUS | Status: AC | PRN
  Administered 2024-10-08: 8.9 via INTRAVENOUS

## 2024-10-08 MED ORDER — RUBIDIUM RB82 GENERATOR (RUBYFILL)
13.6000 | PACK | Freq: Once | INTRAVENOUS | Status: AC
  Administered 2024-10-08: 13.6 via INTRAVENOUS

## 2024-10-09 ENCOUNTER — Ambulatory Visit (HOSPITAL_BASED_OUTPATIENT_CLINIC_OR_DEPARTMENT_OTHER): Payer: Self-pay | Admitting: Pulmonary Disease

## 2024-10-12 ENCOUNTER — Telehealth: Payer: Self-pay | Admitting: *Deleted

## 2024-10-12 NOTE — Telephone Encounter (Signed)
 Call to patient last week and had to leave a VM. She called back today to discuss her surgery. She has no assistance at home currently and states she has an 45 year old daughter in a wheelchair that she provides care for such as changing diapers and lifting. I explained that she can't do this immediately after surgery. She states she understands, but will call back to reschedule once she has a CG for herself and her daughter in place. Surgery is scheduled Friday, 10/16/24 at [facility]. Needs to be canceled. Spoke with Dr. Jerri about this.

## 2024-10-12 NOTE — Telephone Encounter (Signed)
Ok, thanks for the info

## 2024-10-12 NOTE — Progress Notes (Signed)
 Vicki Mccoy                                          MRN: [id number]   10/12/2024   The [facility]I Quality Team Specialist reviewed this patient medical record for the purposes of chart review for care gap closure. The following were reviewed: chart review for care gap closure-glycemic status assessment and kidney health evaluation for diabetes:eGFR  and uACR.    [facility]BCI Quality Team

## 2024-10-13 ENCOUNTER — Other Ambulatory Visit: Payer: Self-pay | Admitting: Physician Assistant

## 2024-10-13 NOTE — Telephone Encounter (Signed)
 I agree with cancelling.  Thanks.

## 2024-10-15 ENCOUNTER — Ambulatory Visit (INDEPENDENT_AMBULATORY_CARE_PROVIDER_SITE_OTHER): Admitting: Pulmonary Disease

## 2024-10-15 ENCOUNTER — Encounter (HOSPITAL_BASED_OUTPATIENT_CLINIC_OR_DEPARTMENT_OTHER): Payer: Self-pay | Admitting: Pulmonary Disease

## 2024-10-15 VITALS — BP 114/82 | HR 60 | Ht 64.0 in | Wt 116.0 lb

## 2024-10-15 DIAGNOSIS — Z9225 Personal history of immunosupression therapy: Secondary | ICD-10-CM

## 2024-10-15 DIAGNOSIS — R053 Chronic cough: Secondary | ICD-10-CM

## 2024-10-15 DIAGNOSIS — D869 Sarcoidosis, unspecified: Secondary | ICD-10-CM

## 2024-10-15 DIAGNOSIS — I442 Atrioventricular block, complete: Secondary | ICD-10-CM | POA: Diagnosis not present

## 2024-10-15 DIAGNOSIS — R918 Other nonspecific abnormal finding of lung field: Secondary | ICD-10-CM | POA: Diagnosis not present

## 2024-10-15 DIAGNOSIS — D8689 Sarcoidosis of other sites: Secondary | ICD-10-CM

## 2024-10-15 DIAGNOSIS — Z227 Latent tuberculosis: Secondary | ICD-10-CM | POA: Diagnosis not present

## 2024-10-15 NOTE — Patient Instructions (Signed)
--  CONTINUE methotrexate  10 mg weekly and folic acid  1 mg daily.  Will plan to de-escalate in March however if planning for surgery can consider sooner titration off --CONTINUE Infliximab  per schedule --ORDER labs: with infusions

## 2024-10-15 NOTE — Progress Notes (Signed)
 Subjective:   PATIENT ID: Vicki LOISE Nasuti GENDER: female DOB: Jun 17, 1981, MRN: [id number]  Chief Complaint  Patient presents with   Sarcoidosis    Reason for Visit: Follow-up  [name] is a 43 year old female with CHB s/p ICD 08/2022, DM2, GERD, migraines who presents for follow-up for sarcoid  Synopsis: She was admitted in October 2023 for syncope and diagnosed with CHB s/p ICD placement. Cardiac MRI demonstrated concerns for sarcoidosis She was started prednisone  taper 40 mg x 1 month followed by 30 mg daily.  2024 - Prednisone  20 mg daily. Started on methotexate 12/20/22. Skin itching with sun exposure related to methotrexate  otherwise tolerating methotrexate  15 mg weekly 2025 - Started on Infliximab  11/21/23 and methotrexate  10 mg weekly  07/17/24 Since our last visit she reports overall doing well. Will walk every other day for 30 min to 2 hours. Will feel sore the following times. Has coughing in the morning and evening. Sometimes occurs with drinking. Denies issues swallowing or choking on food. Denies shortness of breath wheezing. Denies chest pain.  10/15/24 Since our last visit overall doing well. No shortness of breath or wheezing. Has some nasal congestion on allergy medicine. Reports her bedroom has low humidity. Walking daily. Coughing has resolved and no longer on PPI. Has been advised to consider left hip surgery due to osteoarthritis, not sure if she will pursue.  Social History: Never smoker Daughter with cerebral palsy. Patient primary CP  Past Medical History:  Diagnosis Date   AICD (automatic cardioverter/defibrillator) present    Allergy    Arthritis    Cardiac sarcoidosis    Complete heart block (HCC)    Pre-diabetes    Sickle cell trait    URI 09/20/2010     Family History  Problem Relation Age of Onset   Sickle cell anemia Mother    Hypertension Mother    Irritable bowel syndrome Mother    Multiple sclerosis Sister    Diabetes Sister     Hypertension Sister    Diabetes Brother    Seizures Brother        as a development worker, international aid   Diabetes Maternal Grandmother    Cerebral palsy Daughter    Seizures Daughter    Allergic rhinitis Neg Hx    Angioedema Neg Hx    Asthma Neg Hx    Eczema Neg Hx    Urticaria Neg Hx    Immunodeficiency Neg Hx    Breast cancer Neg Hx    Colon cancer Neg Hx    Esophageal cancer Neg Hx    Rectal cancer Neg Hx    Stomach cancer Neg Hx      Social History   Occupational History   Not on file  Tobacco Use   Smoking status: Never   Smokeless tobacco: Never  Vaping Use   Vaping status: Never Used  Substance and Sexual Activity   Alcohol use: No    Alcohol/week: 0.0 standard drinks of alcohol   Drug use: No   Sexual activity: Not Currently    Partners: Male    No Known Allergies   Outpatient Medications Prior to Visit  Medication Sig Dispense Refill   ACCU-CHEK GUIDE test strip USE TO CHECK BLOOD SUGAR 3 TIMES DAILY     acetaminophen  (TYLENOL ) 325 MG tablet Take 2 tablets (650 mg total) by mouth every 4 (four) hours as needed for headache or mild pain.     cetirizine (ZYRTEC) 10 MG tablet Take 10 mg  by mouth daily.     docusate sodium  (COLACE) 100 MG capsule Take 1 capsule (100 mg total) by mouth daily as needed. 30 capsule 2   folic acid  (FOLVITE ) 1 MG tablet Take 2 tablets (2 mg total) by mouth daily. (Patient taking differently: Take 1 mg by mouth daily.) 180 tablet 3   methocarbamol  (ROBAXIN ) 750 MG tablet Take 1 tablet (750 mg total) by mouth 3 (three) times daily as needed. (Patient not taking: Reported on 10/15/2024) 30 tablet 2   methotrexate  (RHEUMATREX) 2.5 MG tablet Take 4 tablets (10 mg total) by mouth once a week. ill provides refills at next appointment. 48 tablet 2   ondansetron  (ZOFRAN ) 4 MG tablet Take 1 tablet (4 mg total) by mouth every 8 (eight) hours as needed for nausea or vomiting. (Patient not taking: Reported on 10/15/2024) 40 tablet 0   doxycycline  (VIBRA -TABS) 100 MG tablet  Take 1 tablet (100 mg total) by mouth 2 (two) times daily. (Patient not taking: Reported on 10/15/2024) 20 tablet 0   fluticasone  (FLONASE ) 50 MCG/ACT nasal spray Place 1 spray into both nostrils daily. (Patient not taking: Reported on 10/15/2024)     OVER THE COUNTER MEDICATION Digestive enzymes : 2 pills every other day prn (Patient not taking: Reported on 10/15/2024)     SUMAtriptan (IMITREX) 25 MG tablet Take 25 mg by mouth every 2 (two) hours as needed. (Patient not taking: Reported on 10/15/2024)     No facility-administered medications prior to visit.    Review of Systems  Constitutional:  Negative for chills, diaphoresis, fever, malaise/fatigue and weight loss.  HENT:  Negative for congestion.   Respiratory:  Negative for cough, hemoptysis, sputum production, shortness of breath and wheezing.   Cardiovascular:  Negative for chest pain, palpitations and leg swelling.     Objective:   Vitals:   10/15/24 1001  BP: 114/82  Pulse: 60  SpO2: 100%  Weight: 116 lb (52.6 kg)  Height: 5' 4 (1.626 m)     SpO2: 100 % Physical Exam: General: Well-appearing, no acute distress HENT: [place], AT Eyes: EOMI, no scleral icterus Respiratory: Clear to auscultation bilaterally.  No crackles, wheezing or rales Cardiovascular: RRR, -M/R/G, no JVD Extremities:-Edema,-tenderness Neuro: AAO x4, CNII-XII grossly intact Psych: Normal mood, normal affect  Data Reviewed:  Imaging: MR Cardiac 08/22/22 - Concerning for cardiac sarcoid CT Chest 08/23/22 - No mediastinal or hilar adenopathy. Scattered subcentimeter pulmonary nodules with largest 6 mm in RUL CT Chest 01/16/23 - Minimal/mild scattered peribronchovascular nodularity. Stable nodules including RUL measuring 6 mm. Tiny bilateral pleural effusions.  PET/CT 10/02/23 -   FDG uptake findings are consistent with active myocardial inflammation/sarcoidosis in multiple segments. There appears to be a small area of fixed reduction in perfusion in  the basal septum, probably due to postinflammatory scar.  Cardiac PET 10/08/24 with no active inflammation suggestive of treatment response  PFT: 09/20/15 FVC 2.52 (107%) FEV1 2.18 (99%) Ratio 86   Interpretation: Normal spirometry  Labs:    Latest Ref Rng & Units 10/07/2024   11:15 AM 09/03/2024    9:35 AM 07/03/2024    9:35 AM  CBC  WBC 4.0 - 10.5 K/uL 4.8  3.0  3.3   Hemoglobin 12.0 - 15.0 g/dL 87.4  87.5  86.8   Hematocrit 36.0 - 46.0 % 37.5  37.6  39.8   Platelets 150 - 400 K/uL 262  269  259   Normalized WBC     Latest Ref Rng & Units 10/07/2024  11:15 AM 09/03/2024    9:35 AM 07/03/2024    9:35 AM  BMP  Glucose 70 - 99 mg/dL 86  79  84   BUN 6 - 20 mg/dL 21  26  16    Creatinine 0.44 - 1.00 mg/dL 9.07  8.84  9.07   BUN/Creat Ratio 6 - 22 (calc)  23  SEE NOTE:   Sodium 135 - 145 mmol/L 137  137  136   Potassium 3.5 - 5.1 mmol/L 4.1  4.5  4.9   Chloride 98 - 111 mmol/L 104  106  105   CO2 22 - 32 mmol/L 25  22  24    Calcium 8.9 - 10.3 mg/dL 9.6  9.1  9.0   Normal renal function     Latest Ref Rng & Units 09/03/2024    9:35 AM 07/03/2024    9:35 AM 05/04/2024   10:06 AM  Hepatic Function  Total Protein 6.1 - 8.1 g/dL 6.6  6.9  6.8   AST 10 - 35 U/L 14  28  16    ALT 6 - 29 U/L 12  19  17    Total Bilirubin 0.2 - 1.2 mg/dL 0.6  0.7  0.9   Normal LFTs      Assessment & Plan:   Discussion: 43 year old female with CHB s/p ICD 08/2022, DM2, GERD, migraines who presents for sarcoid follow-up. On immunosuppressants for active cardiac sarcoid.  We discussed the clinical course of sarcoid and management including serial PFTs, labs, eye exam, and EKG and chest imaging if indicated.   Uncontrolled cardiac sarcoid on methotrexate  alone. Immunosuppression plan discussed below. Infliximab  added in January 2025 with good treatment response seen on 10/2024 cardiac PET.   Cardiac sarcoidosis Complete heart block s/p ICD 08/2022 --Cardiac MRI 08/22/22 concerning for  sarcoidosis --Prednisone  11/26/21 - self-discontinued  --CONTINUE methotrexate  and infliximab  --Reviewed cardiac PET 10/02/23 with consistent active myocardial sarcoid. Pulmonary nodules are stable --Reviewed cardiac PET 10/08/24 with no active inflammation suggestive of treatment response  History of immunosuppression High risk medication management --Prednisone  08/23/22-current. Initially 40 mg --Prednisone  1/22/2-2/12/24 10 mg daily --Starting methotrexate  12/20/2022  --03/29/23 Reduced methotrexate  to 15 mg weekly d/t skin sensitivity --10/07/23-02/03/24 Restarted and wean off prednisone  taper --11/21/23 Infliximab  started and methotrexate  10 mg weekly in 01/2024 --CONTINUE methotrexate  10 mg weekly and folic acid  1 mg daily.  Will plan to de-escalate in March however if planning for surgery can consider sooner titration off --CONTINUE Infliximab  per schedule --ORDER labs: with infusions  Sarcoid Monitoring --Recent chest imaging reviewed.  --Annual PFTs.  Last PFTs 09/2015 --Annual ophthalmology exam.  Last visit on 2024. Patient missed 2025 appt and planning to reschedule --Recent EKG and TTE reviewed. 08/22/22 Normal except for CHB --Routine labs as needed: CBC with diff, CMET  Subcentimeter pulmonary nodules with largest 6 mm in RUL - stable --CT Chest 01/16/23 reviewed with stable nodules  Chronic cough - resolved. May be environmentally related +/-reflux --Off PPI  Hx latent TB Treated x 6 months with health department at 43 years old. Contracted from exposure in the [place]  (reports Spanish speaking friend).    Health Maintenance Immunization History  Administered Date(s) Administered   Influenza Whole 10/06/2008, 11/01/2009, 09/20/2010   Td 06/20/2010   CT Lung Screen - not qualified. Insufficient tobacco history  No orders of the defined types were placed in this encounter.  No orders of the defined types were placed in this encounter.  Return in about 3 months (around  01/13/2025).  I have spent a total time of 41-minutes on the day of the appointment including chart review, data review, collecting history, coordinating care and discussing medical diagnosis and plan with the patient/family. Past medical history, allergies, medications were reviewed. Pertinent imaging, labs and tests included in this note have been reviewed and interpreted independently by me.  Charletta Voight Slater Staff, MD [facility] 10/15/2024 10:15 AM

## 2024-10-16 ENCOUNTER — Encounter (HOSPITAL_COMMUNITY): Payer: Self-pay | Admitting: Physician Assistant

## 2024-10-16 ENCOUNTER — Ambulatory Visit (HOSPITAL_COMMUNITY): Admission: RE | Admit: 2024-10-16 | Source: Ambulatory Visit | Admitting: Orthopaedic Surgery

## 2024-10-16 ENCOUNTER — Encounter (HOSPITAL_COMMUNITY): Admission: RE | Payer: Self-pay | Source: Ambulatory Visit

## 2024-10-16 DIAGNOSIS — M1612 Unilateral primary osteoarthritis, left hip: Secondary | ICD-10-CM

## 2024-10-16 LAB — TYPE AND SCREEN
ABO/RH(D): O POS
Antibody Screen: NEGATIVE

## 2024-10-16 SURGERY — ARTHROPLASTY, HIP, TOTAL, ANTERIOR APPROACH
Anesthesia: Spinal | Site: Hip | Laterality: Left

## 2024-10-28 ENCOUNTER — Encounter: Admitting: Orthopaedic Surgery

## 2024-11-02 ENCOUNTER — Ambulatory Visit

## 2024-11-06 ENCOUNTER — Telehealth: Payer: Self-pay

## 2024-11-06 NOTE — Telephone Encounter (Signed)
 Auth Submission: NO AUTH NEEDED Site of care: Site of care: [facility] Payer: [employer] dual medicare with Medicaid Medication & CPT/J Code(s) submitted: Avsola  (infliximab -axxq) [id number] Diagnosis Code:  Route of submission (phone, fax, portal): portal Phone # Fax # Auth type: Buy/Bill PB Units/visits requested: 200mg  x 6 doses Reference number: [id number] Approval from: 11/06/24 to 11/04/25

## 2024-11-12 ENCOUNTER — Ambulatory Visit

## 2024-11-20 ENCOUNTER — Ambulatory Visit: Attending: Cardiovascular Disease

## 2024-11-20 ENCOUNTER — Ambulatory Visit: Payer: Self-pay | Admitting: Cardiovascular Disease

## 2024-11-20 DIAGNOSIS — I442 Atrioventricular block, complete: Secondary | ICD-10-CM

## 2024-11-20 LAB — CUP PACEART REMOTE DEVICE CHECK
Battery Remaining Longevity: 84 mo
Battery Remaining Percentage: 76 %
Battery Voltage: 2.99 V
Brady Statistic AP VP Percent: 1 %
Brady Statistic AP VS Percent: 9.7 %
Brady Statistic AS VP Percent: 4.9 %
Brady Statistic AS VS Percent: 85 %
Brady Statistic RA Percent Paced: 8.6 %
Brady Statistic RV Percent Paced: 4.9 %
Date Time Interrogation Session: 20260116021227
HighPow Impedance: 61 Ohm
Implantable Lead Connection Status: 753985
Implantable Lead Connection Status: 753985
Implantable Lead Implant Date: 20231018
Implantable Lead Implant Date: 20231018
Implantable Lead Location: 753859
Implantable Lead Location: 753860
Implantable Pulse Generator Implant Date: 20231018
Lead Channel Impedance Value: 330 Ohm
Lead Channel Impedance Value: 390 Ohm
Lead Channel Pacing Threshold Amplitude: 0.5 V
Lead Channel Pacing Threshold Amplitude: 1 V
Lead Channel Pacing Threshold Pulse Width: 0.5 ms
Lead Channel Pacing Threshold Pulse Width: 0.5 ms
Lead Channel Sensing Intrinsic Amplitude: 2.2 mV
Lead Channel Sensing Intrinsic Amplitude: 2.8 mV
Lead Channel Setting Pacing Amplitude: 1.25 V
Lead Channel Setting Pacing Amplitude: 1.5 V
Lead Channel Setting Pacing Pulse Width: 0.5 ms
Lead Channel Setting Sensing Sensitivity: 0.5 mV
Pulse Gen Serial Number: 211001606
Zone Setting Status: 755011

## 2024-11-24 ENCOUNTER — Other Ambulatory Visit: Payer: Self-pay

## 2024-11-24 ENCOUNTER — Ambulatory Visit

## 2024-11-24 VITALS — BP 118/73 | HR 63 | Temp 97.9°F | Resp 14 | Ht 64.0 in | Wt 120.8 lb

## 2024-11-24 DIAGNOSIS — D869 Sarcoidosis, unspecified: Secondary | ICD-10-CM | POA: Diagnosis not present

## 2024-11-24 DIAGNOSIS — Z79899 Other long term (current) drug therapy: Secondary | ICD-10-CM | POA: Diagnosis not present

## 2024-11-24 LAB — COMPREHENSIVE METABOLIC PANEL WITH GFR
AG Ratio: 1.7 (calc) (ref 1.0–2.5)
ALT: 12 U/L (ref 6–29)
AST: 15 U/L (ref 10–35)
Albumin: 4.6 g/dL (ref 3.6–5.1)
Alkaline phosphatase (APISO): 57 U/L (ref 31–125)
BUN/Creatinine Ratio: 18 (calc) (ref 6–22)
BUN: 18 mg/dL (ref 7–25)
CO2: 27 mmol/L (ref 20–32)
Calcium: 9.7 mg/dL (ref 8.6–10.2)
Chloride: 102 mmol/L (ref 98–110)
Creat: 1.01 mg/dL — ABNORMAL HIGH (ref 0.50–0.99)
Globulin: 2.7 g/dL (ref 1.9–3.7)
Glucose, Bld: 87 mg/dL (ref 65–99)
Potassium: 4.5 mmol/L (ref 3.5–5.3)
Sodium: 137 mmol/L (ref 135–146)
Total Bilirubin: 0.8 mg/dL (ref 0.2–1.2)
Total Protein: 7.3 g/dL (ref 6.1–8.1)
eGFR: 70 mL/min/1.73m2

## 2024-11-24 LAB — CBC WITH DIFFERENTIAL/PLATELET
Absolute Lymphocytes: 706 {cells}/uL — ABNORMAL LOW (ref 850–3900)
Absolute Monocytes: 338 {cells}/uL (ref 200–950)
Basophils Absolute: 29 {cells}/uL (ref 0–200)
Basophils Relative: 0.8 %
Eosinophils Absolute: 68 {cells}/uL (ref 15–500)
Eosinophils Relative: 1.9 %
HCT: 36.2 % (ref 35.9–46.0)
Hemoglobin: 12.2 g/dL (ref 11.7–15.5)
MCH: 29.2 pg (ref 27.0–33.0)
MCHC: 33.7 g/dL (ref 31.6–35.4)
MCV: 86.6 fL (ref 81.4–101.7)
MPV: 11.2 fL (ref 7.5–12.5)
Monocytes Relative: 9.4 %
Neutro Abs: 2459 {cells}/uL (ref 1500–7800)
Neutrophils Relative %: 68.3 %
Platelets: 311 Thousand/uL (ref 140–400)
RBC: 4.18 Million/uL (ref 3.80–5.10)
RDW: 13.8 % (ref 11.0–15.0)
Total Lymphocyte: 19.6 %
WBC: 3.6 Thousand/uL — ABNORMAL LOW (ref 3.8–10.8)

## 2024-11-24 MED ORDER — DIPHENHYDRAMINE HCL 25 MG PO CAPS
25.0000 mg | ORAL_CAPSULE | Freq: Once | ORAL | Status: AC
  Administered 2024-11-24: 25 mg via ORAL
  Filled 2024-11-24: qty 1

## 2024-11-24 MED ORDER — ACETAMINOPHEN 325 MG PO TABS
650.0000 mg | ORAL_TABLET | Freq: Once | ORAL | Status: AC
  Administered 2024-11-24: 650 mg via ORAL
  Filled 2024-11-24: qty 2

## 2024-11-24 MED ORDER — SODIUM CHLORIDE 0.9 % IV SOLN
3.0000 mg/kg | Freq: Once | INTRAVENOUS | Status: AC
  Administered 2024-11-24: 200 mg via INTRAVENOUS
  Filled 2024-11-24: qty 20

## 2024-11-24 NOTE — Progress Notes (Signed)
 Diagnosis:  Sarcoidosis  Provider:  Lonna Coder MD  Procedure: IV Infusion  IV Type: Peripheral, IV Location: R Antecubital   Avsola  (infliximab -axxq), Dose: 200 mg  Infusion Start Time: 1133  Infusion Stop Time: 1339  Post Infusion IV Care: Peripheral IV Discontinued  Discharge: Condition: Good, Destination: Home . AVS Declined  Performed by:  Leita FORBES Miles, LPN

## 2024-11-26 NOTE — Progress Notes (Signed)
 Remote ICD Transmission

## 2024-11-30 ENCOUNTER — Ambulatory Visit (HOSPITAL_BASED_OUTPATIENT_CLINIC_OR_DEPARTMENT_OTHER): Payer: Self-pay | Admitting: Pulmonary Disease

## 2024-12-09 ENCOUNTER — Ambulatory Visit: Admitting: Neurology

## 2025-01-13 ENCOUNTER — Ambulatory Visit (HOSPITAL_BASED_OUTPATIENT_CLINIC_OR_DEPARTMENT_OTHER): Admitting: Pulmonary Disease

## 2025-01-19 ENCOUNTER — Ambulatory Visit

## 2025-02-19 ENCOUNTER — Ambulatory Visit

## 2025-05-21 ENCOUNTER — Ambulatory Visit

## 2025-08-20 ENCOUNTER — Ambulatory Visit

## 2025-09-09 ENCOUNTER — Ambulatory Visit: Admitting: Neurology

## 2025-11-19 ENCOUNTER — Ambulatory Visit

## 2026-02-18 ENCOUNTER — Ambulatory Visit
